# Patient Record
Sex: Male | Born: 1935 | Race: White | Hispanic: No | Marital: Married | Smoking: Former smoker
Health system: Southern US, Community
[De-identification: ages and names within clinical notes are randomized; demographics above are authoritative.]

## PROBLEM LIST (undated history)

## (undated) DIAGNOSIS — K5730 Diverticulosis of large intestine without perforation or abscess without bleeding: Secondary | ICD-10-CM

## (undated) DIAGNOSIS — I359 Nonrheumatic aortic valve disorder, unspecified: Secondary | ICD-10-CM

## (undated) DIAGNOSIS — Z952 Presence of prosthetic heart valve: Secondary | ICD-10-CM

## (undated) DIAGNOSIS — Z9889 Other specified postprocedural states: Secondary | ICD-10-CM

## (undated) DIAGNOSIS — I509 Heart failure, unspecified: Secondary | ICD-10-CM

## (undated) DIAGNOSIS — R000 Tachycardia, unspecified: Secondary | ICD-10-CM

## (undated) DIAGNOSIS — I447 Left bundle-branch block, unspecified: Secondary | ICD-10-CM

## (undated) DIAGNOSIS — N189 Chronic kidney disease, unspecified: Secondary | ICD-10-CM

## (undated) DIAGNOSIS — Z7901 Long term (current) use of anticoagulants: Secondary | ICD-10-CM

## (undated) DIAGNOSIS — I428 Other cardiomyopathies: Secondary | ICD-10-CM

## (undated) DIAGNOSIS — I10 Essential (primary) hypertension: Secondary | ICD-10-CM

## (undated) DIAGNOSIS — E079 Disorder of thyroid, unspecified: Secondary | ICD-10-CM

## (undated) DIAGNOSIS — E785 Hyperlipidemia, unspecified: Secondary | ICD-10-CM

## (undated) DIAGNOSIS — Z8679 Personal history of other diseases of the circulatory system: Secondary | ICD-10-CM

## (undated) DIAGNOSIS — I43 Cardiomyopathy in diseases classified elsewhere: Secondary | ICD-10-CM

## (undated) DIAGNOSIS — IMO0002 Reserved for concepts with insufficient information to code with codable children: Secondary | ICD-10-CM

## (undated) HISTORY — PX: CORONARY ARTERY BYPASS GRAFT: SHX141

## (undated) HISTORY — DX: Personal history of other diseases of the circulatory system: Z98.890

## (undated) HISTORY — DX: Disorder of thyroid, unspecified: E07.9

## (undated) HISTORY — DX: Other cardiomyopathies: I42.8

## (undated) HISTORY — DX: Chronic kidney disease, unspecified: N18.9

## (undated) HISTORY — DX: Nonrheumatic aortic valve disorder, unspecified: I35.9

## (undated) HISTORY — DX: Cardiomyopathy in diseases classified elsewhere: I43

## (undated) HISTORY — DX: Tachycardia, unspecified: R00.0

## (undated) HISTORY — DX: Left bundle-branch block, unspecified: I44.7

## (undated) HISTORY — DX: Reserved for concepts with insufficient information to code with codable children: IMO0002

## (undated) HISTORY — DX: Heart failure, unspecified: I50.9

## (undated) HISTORY — DX: Personal history of other diseases of the circulatory system: Z86.79

## (undated) HISTORY — DX: Presence of prosthetic heart valve: Z95.2

## (undated) HISTORY — DX: Diverticulosis of large intestine without perforation or abscess without bleeding: K57.30

## (undated) HISTORY — DX: Essential (primary) hypertension: I10

## (undated) HISTORY — DX: Hyperlipidemia, unspecified: E78.5

## (undated) HISTORY — DX: Long term (current) use of anticoagulants: Z79.01

---

## 1990-08-25 HISTORY — PX: AORTIC VALVE SURGERY: SHX549

## 1998-01-23 ENCOUNTER — Encounter (HOSPITAL_COMMUNITY): Admission: RE | Admit: 1998-01-23 | Discharge: 1998-04-23 | Payer: Self-pay | Admitting: Cardiology

## 1998-05-04 ENCOUNTER — Encounter (HOSPITAL_COMMUNITY): Admission: RE | Admit: 1998-05-04 | Discharge: 1998-08-02 | Payer: Self-pay | Admitting: Cardiology

## 1999-11-21 ENCOUNTER — Encounter: Payer: Self-pay | Admitting: Internal Medicine

## 1999-11-21 ENCOUNTER — Ambulatory Visit (HOSPITAL_COMMUNITY): Admission: RE | Admit: 1999-11-21 | Discharge: 1999-11-21 | Payer: Self-pay | Admitting: Internal Medicine

## 2000-07-24 ENCOUNTER — Ambulatory Visit (HOSPITAL_COMMUNITY): Admission: RE | Admit: 2000-07-24 | Discharge: 2000-07-24 | Payer: Self-pay | Admitting: Cardiology

## 2000-10-15 ENCOUNTER — Inpatient Hospital Stay (HOSPITAL_COMMUNITY): Admission: AD | Admit: 2000-10-15 | Discharge: 2000-10-17 | Payer: Self-pay | Admitting: Internal Medicine

## 2000-10-16 ENCOUNTER — Encounter: Payer: Self-pay | Admitting: Internal Medicine

## 2000-10-16 ENCOUNTER — Ambulatory Visit: Admission: RE | Admit: 2000-10-16 | Discharge: 2000-10-16 | Payer: Self-pay | Admitting: Internal Medicine

## 2002-03-18 ENCOUNTER — Encounter: Admission: RE | Admit: 2002-03-18 | Discharge: 2002-06-16 | Payer: Self-pay | Admitting: Internal Medicine

## 2002-04-12 ENCOUNTER — Encounter: Payer: Self-pay | Admitting: Emergency Medicine

## 2002-04-12 ENCOUNTER — Encounter: Payer: Self-pay | Admitting: Cardiology

## 2002-04-12 ENCOUNTER — Inpatient Hospital Stay (HOSPITAL_COMMUNITY): Admission: EM | Admit: 2002-04-12 | Discharge: 2002-04-17 | Payer: Self-pay | Admitting: Emergency Medicine

## 2002-04-13 ENCOUNTER — Encounter: Payer: Self-pay | Admitting: Internal Medicine

## 2002-04-15 ENCOUNTER — Encounter: Payer: Self-pay | Admitting: Internal Medicine

## 2004-07-02 ENCOUNTER — Ambulatory Visit: Payer: Self-pay

## 2004-07-24 ENCOUNTER — Ambulatory Visit: Payer: Self-pay | Admitting: Cardiology

## 2004-07-25 ENCOUNTER — Ambulatory Visit: Payer: Self-pay | Admitting: Internal Medicine

## 2004-08-22 ENCOUNTER — Ambulatory Visit: Payer: Self-pay | Admitting: *Deleted

## 2004-08-25 HISTORY — PX: OTHER SURGICAL HISTORY: SHX169

## 2004-09-06 ENCOUNTER — Ambulatory Visit: Payer: Self-pay | Admitting: Cardiovascular Disease

## 2004-09-27 ENCOUNTER — Ambulatory Visit: Payer: Self-pay | Admitting: Cardiovascular Disease

## 2004-10-24 ENCOUNTER — Ambulatory Visit: Payer: Self-pay | Admitting: Internal Medicine

## 2004-10-25 ENCOUNTER — Ambulatory Visit: Payer: Self-pay | Admitting: Cardiology

## 2004-10-31 ENCOUNTER — Ambulatory Visit: Payer: Self-pay | Admitting: Internal Medicine

## 2004-11-22 ENCOUNTER — Ambulatory Visit: Payer: Self-pay | Admitting: Cardiology

## 2004-12-02 ENCOUNTER — Inpatient Hospital Stay (HOSPITAL_COMMUNITY): Admission: EM | Admit: 2004-12-02 | Discharge: 2004-12-11 | Payer: Self-pay | Admitting: Emergency Medicine

## 2004-12-02 ENCOUNTER — Ambulatory Visit: Payer: Self-pay | Admitting: Cardiology

## 2004-12-03 ENCOUNTER — Encounter: Payer: Self-pay | Admitting: Cardiology

## 2004-12-04 HISTORY — PX: CARDIAC CATHETERIZATION: SHX172

## 2004-12-16 ENCOUNTER — Ambulatory Visit: Payer: Self-pay | Admitting: Cardiology

## 2004-12-16 ENCOUNTER — Ambulatory Visit: Payer: Self-pay | Admitting: Internal Medicine

## 2004-12-23 ENCOUNTER — Ambulatory Visit: Payer: Self-pay | Admitting: Cardiology

## 2004-12-25 ENCOUNTER — Ambulatory Visit: Payer: Self-pay | Admitting: Internal Medicine

## 2004-12-30 ENCOUNTER — Ambulatory Visit: Payer: Self-pay | Admitting: *Deleted

## 2005-01-02 ENCOUNTER — Ambulatory Visit: Payer: Self-pay | Admitting: Internal Medicine

## 2005-01-21 ENCOUNTER — Ambulatory Visit: Payer: Self-pay | Admitting: *Deleted

## 2005-01-23 ENCOUNTER — Ambulatory Visit: Payer: Self-pay

## 2005-02-17 ENCOUNTER — Ambulatory Visit: Payer: Self-pay | Admitting: Internal Medicine

## 2005-02-20 ENCOUNTER — Ambulatory Visit: Payer: Self-pay | Admitting: Internal Medicine

## 2005-03-07 ENCOUNTER — Ambulatory Visit: Payer: Self-pay | Admitting: Internal Medicine

## 2005-03-11 ENCOUNTER — Ambulatory Visit: Payer: Self-pay | Admitting: Internal Medicine

## 2005-03-11 ENCOUNTER — Observation Stay (HOSPITAL_COMMUNITY): Admission: RE | Admit: 2005-03-11 | Discharge: 2005-03-12 | Payer: Self-pay | Admitting: Internal Medicine

## 2005-03-19 ENCOUNTER — Ambulatory Visit: Payer: Self-pay | Admitting: Cardiology

## 2005-03-26 ENCOUNTER — Ambulatory Visit: Payer: Self-pay

## 2005-03-26 ENCOUNTER — Ambulatory Visit: Payer: Self-pay | Admitting: Cardiovascular Disease

## 2005-04-02 ENCOUNTER — Ambulatory Visit: Payer: Self-pay | Admitting: Cardiology

## 2005-04-07 ENCOUNTER — Ambulatory Visit: Payer: Self-pay | Admitting: *Deleted

## 2005-04-16 ENCOUNTER — Ambulatory Visit: Payer: Self-pay | Admitting: Cardiology

## 2005-05-07 ENCOUNTER — Ambulatory Visit: Payer: Self-pay | Admitting: Cardiology

## 2005-05-14 ENCOUNTER — Ambulatory Visit: Payer: Self-pay | Admitting: Cardiology

## 2005-05-28 ENCOUNTER — Ambulatory Visit: Payer: Self-pay | Admitting: Cardiology

## 2005-06-13 ENCOUNTER — Ambulatory Visit: Payer: Self-pay | Admitting: Internal Medicine

## 2005-06-25 ENCOUNTER — Ambulatory Visit: Payer: Self-pay | Admitting: *Deleted

## 2005-07-23 ENCOUNTER — Ambulatory Visit: Payer: Self-pay | Admitting: Cardiology

## 2005-07-31 ENCOUNTER — Ambulatory Visit: Payer: Self-pay | Admitting: Cardiology

## 2005-08-07 ENCOUNTER — Ambulatory Visit: Payer: Self-pay | Admitting: *Deleted

## 2005-09-04 ENCOUNTER — Ambulatory Visit: Payer: Self-pay

## 2005-10-02 ENCOUNTER — Ambulatory Visit: Payer: Self-pay | Admitting: *Deleted

## 2005-10-03 ENCOUNTER — Ambulatory Visit: Payer: Self-pay | Admitting: Internal Medicine

## 2005-10-30 ENCOUNTER — Ambulatory Visit: Payer: Self-pay | Admitting: Cardiology

## 2005-11-04 ENCOUNTER — Ambulatory Visit: Payer: Self-pay | Admitting: Internal Medicine

## 2005-11-11 ENCOUNTER — Ambulatory Visit: Payer: Self-pay | Admitting: Internal Medicine

## 2005-11-14 ENCOUNTER — Ambulatory Visit: Payer: Self-pay | Admitting: Internal Medicine

## 2005-11-20 ENCOUNTER — Ambulatory Visit: Payer: Self-pay | Admitting: Internal Medicine

## 2005-11-27 ENCOUNTER — Ambulatory Visit: Payer: Self-pay | Admitting: Cardiology

## 2005-12-03 ENCOUNTER — Ambulatory Visit: Payer: Self-pay | Admitting: Internal Medicine

## 2005-12-08 ENCOUNTER — Ambulatory Visit: Payer: Self-pay | Admitting: Internal Medicine

## 2005-12-15 ENCOUNTER — Ambulatory Visit: Payer: Self-pay | Admitting: Internal Medicine

## 2005-12-16 ENCOUNTER — Ambulatory Visit: Payer: Self-pay | Admitting: Cardiology

## 2005-12-22 ENCOUNTER — Ambulatory Visit (HOSPITAL_COMMUNITY): Admission: RE | Admit: 2005-12-22 | Discharge: 2005-12-22 | Payer: Self-pay | Admitting: Cardiology

## 2005-12-22 ENCOUNTER — Encounter: Payer: Self-pay | Admitting: Cardiology

## 2005-12-22 ENCOUNTER — Ambulatory Visit: Payer: Self-pay | Admitting: Cardiology

## 2005-12-24 ENCOUNTER — Ambulatory Visit: Payer: Self-pay | Admitting: Cardiology

## 2005-12-29 ENCOUNTER — Ambulatory Visit: Payer: Self-pay | Admitting: Cardiology

## 2006-01-07 ENCOUNTER — Ambulatory Visit: Payer: Self-pay | Admitting: Cardiology

## 2006-02-04 ENCOUNTER — Ambulatory Visit: Payer: Self-pay | Admitting: Cardiovascular Disease

## 2006-03-04 ENCOUNTER — Ambulatory Visit: Payer: Self-pay | Admitting: Internal Medicine

## 2006-03-09 ENCOUNTER — Ambulatory Visit: Payer: Self-pay | Admitting: Internal Medicine

## 2006-03-11 ENCOUNTER — Ambulatory Visit: Payer: Self-pay | Admitting: Internal Medicine

## 2006-03-13 ENCOUNTER — Ambulatory Visit: Payer: Self-pay | Admitting: Internal Medicine

## 2006-03-18 ENCOUNTER — Ambulatory Visit: Payer: Self-pay | Admitting: Cardiology

## 2006-04-01 ENCOUNTER — Ambulatory Visit: Payer: Self-pay | Admitting: Cardiology

## 2006-04-02 ENCOUNTER — Ambulatory Visit: Payer: Self-pay | Admitting: Internal Medicine

## 2006-04-09 ENCOUNTER — Ambulatory Visit: Payer: Self-pay | Admitting: Internal Medicine

## 2006-04-20 ENCOUNTER — Ambulatory Visit: Payer: Self-pay | Admitting: Cardiology

## 2006-05-14 ENCOUNTER — Ambulatory Visit: Payer: Self-pay | Admitting: Cardiology

## 2006-05-21 ENCOUNTER — Ambulatory Visit: Payer: Self-pay | Admitting: Internal Medicine

## 2006-06-09 ENCOUNTER — Ambulatory Visit: Payer: Self-pay | Admitting: Internal Medicine

## 2006-06-11 ENCOUNTER — Ambulatory Visit: Payer: Self-pay | Admitting: Internal Medicine

## 2006-06-18 ENCOUNTER — Ambulatory Visit: Payer: Self-pay | Admitting: Internal Medicine

## 2006-06-19 ENCOUNTER — Ambulatory Visit: Admission: RE | Admit: 2006-06-19 | Discharge: 2006-06-19 | Payer: Self-pay | Admitting: Internal Medicine

## 2006-06-19 ENCOUNTER — Ambulatory Visit: Payer: Self-pay

## 2006-06-19 ENCOUNTER — Ambulatory Visit: Payer: Self-pay | Admitting: Internal Medicine

## 2006-06-19 ENCOUNTER — Encounter: Payer: Self-pay | Admitting: Internal Medicine

## 2006-06-25 ENCOUNTER — Ambulatory Visit: Payer: Self-pay | Admitting: Internal Medicine

## 2006-06-26 ENCOUNTER — Ambulatory Visit: Payer: Self-pay | Admitting: Cardiology

## 2006-07-17 ENCOUNTER — Ambulatory Visit: Payer: Self-pay | Admitting: Cardiology

## 2006-07-30 ENCOUNTER — Ambulatory Visit: Payer: Self-pay | Admitting: Internal Medicine

## 2006-08-13 ENCOUNTER — Ambulatory Visit: Payer: Self-pay | Admitting: Internal Medicine

## 2006-08-14 ENCOUNTER — Ambulatory Visit: Payer: Self-pay | Admitting: Cardiovascular Disease

## 2006-08-27 ENCOUNTER — Ambulatory Visit: Payer: Self-pay | Admitting: Internal Medicine

## 2006-08-27 LAB — CONVERTED CEMR LAB: T4, Total: 9.1 ug/dL (ref 5.0–12.5)

## 2006-08-31 ENCOUNTER — Ambulatory Visit: Payer: Self-pay | Admitting: Internal Medicine

## 2006-09-01 ENCOUNTER — Ambulatory Visit: Payer: Self-pay | Admitting: Cardiology

## 2006-09-23 ENCOUNTER — Ambulatory Visit: Payer: Self-pay | Admitting: Internal Medicine

## 2006-10-01 ENCOUNTER — Ambulatory Visit: Payer: Self-pay | Admitting: Cardiology

## 2006-10-15 ENCOUNTER — Ambulatory Visit: Payer: Self-pay | Admitting: Internal Medicine

## 2006-10-15 LAB — CONVERTED CEMR LAB
Free T4: 1 ng/dL (ref 0.6–1.6)
T3 Uptake Ratio: 42.7 % — ABNORMAL HIGH (ref 22.5–37.0)

## 2006-11-05 ENCOUNTER — Ambulatory Visit: Payer: Self-pay | Admitting: Cardiology

## 2006-12-03 ENCOUNTER — Ambulatory Visit: Payer: Self-pay | Admitting: Cardiology

## 2006-12-10 ENCOUNTER — Ambulatory Visit: Payer: Self-pay | Admitting: Internal Medicine

## 2006-12-17 ENCOUNTER — Ambulatory Visit: Payer: Self-pay | Admitting: Internal Medicine

## 2006-12-31 ENCOUNTER — Ambulatory Visit: Payer: Self-pay | Admitting: Cardiology

## 2007-01-07 ENCOUNTER — Ambulatory Visit: Payer: Self-pay | Admitting: Internal Medicine

## 2007-01-26 ENCOUNTER — Ambulatory Visit: Payer: Self-pay | Admitting: Cardiology

## 2007-02-12 ENCOUNTER — Ambulatory Visit: Payer: Self-pay | Admitting: Internal Medicine

## 2007-02-12 DIAGNOSIS — I4891 Unspecified atrial fibrillation: Secondary | ICD-10-CM | POA: Insufficient documentation

## 2007-02-12 DIAGNOSIS — I10 Essential (primary) hypertension: Secondary | ICD-10-CM | POA: Insufficient documentation

## 2007-02-12 DIAGNOSIS — E039 Hypothyroidism, unspecified: Secondary | ICD-10-CM

## 2007-02-12 DIAGNOSIS — E785 Hyperlipidemia, unspecified: Secondary | ICD-10-CM | POA: Insufficient documentation

## 2007-02-12 LAB — CONVERTED CEMR LAB
PSA: 0.84 ng/mL (ref 0.10–4.00)
T4, Total: 10.3 ug/dL (ref 5.0–12.5)
TSH: 2.56 microintl units/mL (ref 0.35–5.50)

## 2007-02-23 ENCOUNTER — Ambulatory Visit: Payer: Self-pay | Admitting: Cardiology

## 2007-03-17 ENCOUNTER — Ambulatory Visit: Payer: Self-pay | Admitting: Gastroenterology

## 2007-03-23 ENCOUNTER — Ambulatory Visit: Payer: Self-pay | Admitting: Cardiology

## 2007-03-31 ENCOUNTER — Ambulatory Visit (HOSPITAL_COMMUNITY): Admission: RE | Admit: 2007-03-31 | Discharge: 2007-03-31 | Payer: Self-pay | Admitting: Gastroenterology

## 2007-03-31 ENCOUNTER — Encounter: Payer: Self-pay | Admitting: Gastroenterology

## 2007-03-31 DIAGNOSIS — K5730 Diverticulosis of large intestine without perforation or abscess without bleeding: Secondary | ICD-10-CM

## 2007-03-31 HISTORY — DX: Diverticulosis of large intestine without perforation or abscess without bleeding: K57.30

## 2007-04-05 ENCOUNTER — Ambulatory Visit: Payer: Self-pay | Admitting: Gastroenterology

## 2007-04-09 ENCOUNTER — Ambulatory Visit: Payer: Self-pay | Admitting: Cardiology

## 2007-04-13 ENCOUNTER — Ambulatory Visit: Payer: Self-pay | Admitting: Internal Medicine

## 2007-04-13 DIAGNOSIS — M1710 Unilateral primary osteoarthritis, unspecified knee: Secondary | ICD-10-CM

## 2007-04-22 ENCOUNTER — Ambulatory Visit: Payer: Self-pay | Admitting: Cardiology

## 2007-05-13 ENCOUNTER — Ambulatory Visit: Payer: Self-pay | Admitting: Internal Medicine

## 2007-05-13 ENCOUNTER — Ambulatory Visit: Payer: Self-pay | Admitting: Cardiology

## 2007-05-13 LAB — CONVERTED CEMR LAB: Prothrombin Time: 30 s — ABNORMAL HIGH (ref 10.9–13.3)

## 2007-06-14 ENCOUNTER — Ambulatory Visit: Payer: Self-pay | Admitting: Internal Medicine

## 2007-07-08 ENCOUNTER — Ambulatory Visit: Payer: Self-pay | Admitting: Internal Medicine

## 2007-07-08 LAB — CONVERTED CEMR LAB
AST: 34 units/L (ref 0–37)
Albumin: 3.6 g/dL (ref 3.5–5.2)
Alkaline Phosphatase: 109 units/L (ref 39–117)
BUN: 18 mg/dL (ref 6–23)
Bilirubin, Direct: 0.1 mg/dL (ref 0.0–0.3)
Cholesterol: 234 mg/dL (ref 0–200)
Direct LDL: 149.7 mg/dL
GFR calc non Af Amer: 49 mL/min
HDL: 21.7 mg/dL — ABNORMAL LOW (ref >39.0)
Total Bilirubin: 0.8 mg/dL (ref 0.3–1.2)
Total CHOL/HDL Ratio: 10.8
Total Protein: 6.8 g/dL (ref 6.0–8.3)
VLDL: 64 mg/dL — ABNORMAL HIGH (ref 0–40)

## 2007-07-12 ENCOUNTER — Ambulatory Visit: Payer: Self-pay | Admitting: Cardiology

## 2007-07-15 ENCOUNTER — Ambulatory Visit: Payer: Self-pay | Admitting: Internal Medicine

## 2007-07-15 DIAGNOSIS — E8881 Metabolic syndrome: Secondary | ICD-10-CM | POA: Insufficient documentation

## 2007-07-15 LAB — CONVERTED CEMR LAB
Cholesterol, target level: 200 mg/dL
HDL goal, serum: 40 mg/dL

## 2007-08-09 ENCOUNTER — Ambulatory Visit: Payer: Self-pay | Admitting: Internal Medicine

## 2007-09-06 ENCOUNTER — Ambulatory Visit: Payer: Self-pay | Admitting: Cardiology

## 2007-09-21 ENCOUNTER — Ambulatory Visit: Payer: Self-pay | Admitting: Internal Medicine

## 2007-09-21 ENCOUNTER — Ambulatory Visit: Payer: Self-pay | Admitting: Cardiovascular Disease

## 2007-10-08 ENCOUNTER — Ambulatory Visit: Payer: Self-pay | Admitting: Internal Medicine

## 2007-10-08 LAB — CONVERTED CEMR LAB
AST: 47 units/L — ABNORMAL HIGH (ref 0–37)
Bilirubin, Direct: 0.2 mg/dL (ref 0.0–0.3)
Total Bilirubin: 1.2 mg/dL (ref 0.3–1.2)
Total Protein: 6.7 g/dL (ref 6.0–8.3)

## 2007-10-15 ENCOUNTER — Ambulatory Visit: Payer: Self-pay | Admitting: Internal Medicine

## 2007-10-19 ENCOUNTER — Ambulatory Visit: Payer: Self-pay | Admitting: Cardiology

## 2007-11-16 ENCOUNTER — Ambulatory Visit: Payer: Self-pay | Admitting: Cardiovascular Disease

## 2007-12-07 ENCOUNTER — Telehealth: Payer: Self-pay | Admitting: Internal Medicine

## 2007-12-07 DIAGNOSIS — N182 Chronic kidney disease, stage 2 (mild): Secondary | ICD-10-CM

## 2007-12-14 ENCOUNTER — Ambulatory Visit: Payer: Self-pay | Admitting: Cardiology

## 2007-12-23 ENCOUNTER — Ambulatory Visit: Payer: Self-pay | Admitting: Internal Medicine

## 2008-01-11 ENCOUNTER — Ambulatory Visit: Payer: Self-pay | Admitting: Cardiology

## 2008-02-04 ENCOUNTER — Ambulatory Visit: Payer: Self-pay | Admitting: Internal Medicine

## 2008-02-04 LAB — CONVERTED CEMR LAB: TSH: 2.28 microintl units/mL (ref 0.35–5.50)

## 2008-02-08 ENCOUNTER — Ambulatory Visit: Payer: Self-pay | Admitting: Cardiology

## 2008-02-17 ENCOUNTER — Ambulatory Visit: Payer: Self-pay | Admitting: Internal Medicine

## 2008-02-29 ENCOUNTER — Ambulatory Visit: Payer: Self-pay | Admitting: Cardiovascular Disease

## 2008-03-28 ENCOUNTER — Ambulatory Visit: Payer: Self-pay | Admitting: Cardiology

## 2008-04-25 ENCOUNTER — Ambulatory Visit: Payer: Self-pay | Admitting: Cardiology

## 2008-05-15 ENCOUNTER — Ambulatory Visit: Payer: Self-pay | Admitting: Cardiovascular Disease

## 2008-06-08 ENCOUNTER — Ambulatory Visit: Payer: Self-pay | Admitting: Internal Medicine

## 2008-06-08 LAB — CONVERTED CEMR LAB
ALT: 73 units/L — ABNORMAL HIGH (ref 0–53)
AST: 68 units/L — ABNORMAL HIGH (ref 0–37)
Albumin: 3.7 g/dL (ref 3.5–5.2)
Alkaline Phosphatase: 80 units/L (ref 39–117)
BUN: 18 mg/dL (ref 6–23)
Bilirubin, Direct: 0.1 mg/dL (ref 0.0–0.3)
CO2: 33 meq/L — ABNORMAL HIGH (ref 19–32)
Cholesterol: 164 mg/dL (ref 0–200)
Creatinine, Ser: 1.3 mg/dL (ref 0.4–1.5)
GFR calc non Af Amer: 57 mL/min
Glucose, Bld: 119 mg/dL — ABNORMAL HIGH (ref 70–99)
HDL: 28.9 mg/dL — ABNORMAL LOW (ref >39.0)
LDL Cholesterol: 99 mg/dL (ref 0–99)
Total Protein: 7.5 g/dL (ref 6.0–8.3)
Triglycerides: 183 mg/dL — ABNORMAL HIGH (ref 0–149)

## 2008-06-14 ENCOUNTER — Ambulatory Visit: Payer: Self-pay | Admitting: Internal Medicine

## 2008-06-22 ENCOUNTER — Ambulatory Visit: Payer: Self-pay | Admitting: Internal Medicine

## 2008-07-10 ENCOUNTER — Telehealth: Payer: Self-pay | Admitting: Internal Medicine

## 2008-07-13 ENCOUNTER — Telehealth: Payer: Self-pay | Admitting: Internal Medicine

## 2008-07-18 ENCOUNTER — Ambulatory Visit: Payer: Self-pay | Admitting: Internal Medicine

## 2008-08-07 ENCOUNTER — Ambulatory Visit: Payer: Self-pay | Admitting: Internal Medicine

## 2008-08-07 LAB — CONVERTED CEMR LAB
ALT: 51 units/L (ref 0–53)
Albumin: 3.7 g/dL (ref 3.5–5.2)
Triglycerides: 224 mg/dL (ref 0–149)

## 2008-08-14 ENCOUNTER — Ambulatory Visit: Payer: Self-pay | Admitting: Internal Medicine

## 2008-08-24 ENCOUNTER — Ambulatory Visit: Payer: Self-pay | Admitting: Internal Medicine

## 2008-09-05 ENCOUNTER — Ambulatory Visit: Payer: Self-pay | Admitting: Internal Medicine

## 2008-09-18 ENCOUNTER — Telehealth: Payer: Self-pay | Admitting: Internal Medicine

## 2008-09-18 ENCOUNTER — Ambulatory Visit: Payer: Self-pay | Admitting: Internal Medicine

## 2008-09-18 DIAGNOSIS — R059 Cough, unspecified: Secondary | ICD-10-CM | POA: Insufficient documentation

## 2008-09-18 DIAGNOSIS — R05 Cough: Secondary | ICD-10-CM | POA: Insufficient documentation

## 2008-09-22 ENCOUNTER — Ambulatory Visit: Payer: Self-pay | Admitting: Internal Medicine

## 2008-09-22 LAB — CONVERTED CEMR LAB
INR: 6.7
Prothrombin Time: 31.3 s

## 2008-10-05 ENCOUNTER — Encounter: Payer: Self-pay | Admitting: Internal Medicine

## 2008-10-09 ENCOUNTER — Ambulatory Visit: Payer: Self-pay | Admitting: Internal Medicine

## 2008-10-09 LAB — CONVERTED CEMR LAB
ALT: 33 units/L (ref 0–53)
Bilirubin, Direct: 0.2 mg/dL (ref 0.0–0.3)
Direct LDL: 96.8 mg/dL
HDL: 24.5 mg/dL — ABNORMAL LOW (ref >39.0)
Total CHOL/HDL Ratio: 6.6
Triglycerides: 216 mg/dL (ref 0–149)
VLDL: 43 mg/dL — ABNORMAL HIGH (ref 0–40)

## 2008-10-16 ENCOUNTER — Ambulatory Visit: Payer: Self-pay | Admitting: Internal Medicine

## 2008-10-19 ENCOUNTER — Ambulatory Visit: Payer: Self-pay | Admitting: Internal Medicine

## 2008-11-06 ENCOUNTER — Ambulatory Visit: Payer: Self-pay | Admitting: Internal Medicine

## 2008-11-06 LAB — CONVERTED CEMR LAB: INR: 2.3

## 2008-12-06 ENCOUNTER — Emergency Department (HOSPITAL_COMMUNITY): Admission: EM | Admit: 2008-12-06 | Discharge: 2008-12-06 | Payer: Self-pay | Admitting: Family Medicine

## 2008-12-07 ENCOUNTER — Ambulatory Visit: Payer: Self-pay | Admitting: Internal Medicine

## 2008-12-11 ENCOUNTER — Ambulatory Visit: Payer: Self-pay | Admitting: Internal Medicine

## 2008-12-11 DIAGNOSIS — IMO0002 Reserved for concepts with insufficient information to code with codable children: Secondary | ICD-10-CM

## 2008-12-11 HISTORY — DX: Reserved for concepts with insufficient information to code with codable children: IMO0002

## 2008-12-11 LAB — CONVERTED CEMR LAB: Prothrombin Time: 19.7 s

## 2009-01-02 ENCOUNTER — Encounter: Payer: Self-pay | Admitting: Internal Medicine

## 2009-01-08 ENCOUNTER — Ambulatory Visit: Payer: Self-pay | Admitting: Internal Medicine

## 2009-02-05 ENCOUNTER — Ambulatory Visit: Payer: Self-pay | Admitting: Internal Medicine

## 2009-02-05 LAB — CONVERTED CEMR LAB: Prothrombin Time: 19 s

## 2009-02-28 ENCOUNTER — Ambulatory Visit: Payer: Self-pay | Admitting: Internal Medicine

## 2009-02-28 LAB — CONVERTED CEMR LAB
ALT: 78 units/L — ABNORMAL HIGH (ref 0–53)
AST: 118 units/L — ABNORMAL HIGH (ref 0–37)
Alkaline Phosphatase: 63 units/L (ref 39–117)
CO2: 29 meq/L (ref 19–32)
Calcium: 9.1 mg/dL (ref 8.4–10.5)
Cholesterol: 134 mg/dL (ref 0–200)
Creatinine, Ser: 1.5 mg/dL (ref 0.4–1.5)
Glucose, Bld: 129 mg/dL — ABNORMAL HIGH (ref 70–99)
LDL Cholesterol: 84 mg/dL (ref 0–99)
Total CHOL/HDL Ratio: 5
Total Protein: 7.2 g/dL (ref 6.0–8.3)
VLDL: 23.8 mg/dL (ref 0.0–40.0)

## 2009-03-07 ENCOUNTER — Ambulatory Visit: Payer: Self-pay | Admitting: Internal Medicine

## 2009-03-08 ENCOUNTER — Ambulatory Visit: Payer: Self-pay | Admitting: Internal Medicine

## 2009-04-11 ENCOUNTER — Ambulatory Visit: Payer: Self-pay | Admitting: Internal Medicine

## 2009-04-11 LAB — CONVERTED CEMR LAB
INR: 2.3
Prothrombin Time: 18.4 s

## 2009-05-09 ENCOUNTER — Ambulatory Visit: Payer: Self-pay | Admitting: Internal Medicine

## 2009-05-09 LAB — CONVERTED CEMR LAB
Albumin: 3.2 g/dL — ABNORMAL LOW (ref 3.5–5.2)
Alkaline Phosphatase: 66 units/L (ref 39–117)
Total Bilirubin: 1 mg/dL (ref 0.3–1.2)

## 2009-05-16 ENCOUNTER — Ambulatory Visit: Payer: Self-pay | Admitting: Internal Medicine

## 2009-05-16 LAB — CONVERTED CEMR LAB: INR: 2.5

## 2009-06-07 ENCOUNTER — Ambulatory Visit: Payer: Self-pay | Admitting: Internal Medicine

## 2009-06-13 ENCOUNTER — Encounter: Payer: Self-pay | Admitting: Internal Medicine

## 2009-06-14 ENCOUNTER — Ambulatory Visit: Payer: Self-pay | Admitting: Internal Medicine

## 2009-06-14 LAB — CONVERTED CEMR LAB: INR: 1.8

## 2009-07-12 ENCOUNTER — Ambulatory Visit: Payer: Self-pay | Admitting: Internal Medicine

## 2009-08-10 ENCOUNTER — Ambulatory Visit: Payer: Self-pay | Admitting: Internal Medicine

## 2009-08-10 DIAGNOSIS — G8929 Other chronic pain: Secondary | ICD-10-CM

## 2009-08-10 DIAGNOSIS — M549 Dorsalgia, unspecified: Secondary | ICD-10-CM

## 2009-08-10 LAB — CONVERTED CEMR LAB
BUN: 20 mg/dL (ref 6–23)
CO2: 31 meq/L (ref 19–32)
Cholesterol: 158 mg/dL (ref 0–200)
Direct LDL: 101 mg/dL — ABNORMAL HIGH
Potassium: 4.5 meq/L (ref 3.5–5.3)
TSH: 2.867 microintl units/mL (ref 0.350–4.500)

## 2009-09-07 ENCOUNTER — Ambulatory Visit: Payer: Self-pay | Admitting: Internal Medicine

## 2009-09-07 LAB — CONVERTED CEMR LAB: Prothrombin Time: 18.6 s

## 2009-09-25 ENCOUNTER — Ambulatory Visit: Payer: Self-pay | Admitting: Internal Medicine

## 2009-09-25 DIAGNOSIS — I5022 Chronic systolic (congestive) heart failure: Secondary | ICD-10-CM

## 2009-09-25 DIAGNOSIS — Z95810 Presence of automatic (implantable) cardiac defibrillator: Secondary | ICD-10-CM

## 2009-10-05 ENCOUNTER — Ambulatory Visit: Payer: Self-pay | Admitting: Internal Medicine

## 2009-10-05 LAB — CONVERTED CEMR LAB
INR: 2
Prothrombin Time: 17.3 s

## 2009-10-31 ENCOUNTER — Ambulatory Visit: Payer: Self-pay | Admitting: Internal Medicine

## 2009-10-31 LAB — CONVERTED CEMR LAB
Chloride: 109 meq/L (ref 96–112)
Direct LDL: 96.8 mg/dL
Free T4: 1.8 ng/dL — ABNORMAL HIGH (ref 0.6–1.6)
GFR calc non Af Amer: 48.42 mL/min (ref >60)
Prothrombin Time: 15.9 s

## 2009-11-01 ENCOUNTER — Encounter: Payer: Self-pay | Admitting: Internal Medicine

## 2009-12-03 ENCOUNTER — Ambulatory Visit: Payer: Self-pay | Admitting: Internal Medicine

## 2009-12-27 ENCOUNTER — Encounter: Payer: Self-pay | Admitting: Internal Medicine

## 2009-12-31 ENCOUNTER — Ambulatory Visit: Payer: Self-pay | Admitting: Internal Medicine

## 2009-12-31 LAB — CONVERTED CEMR LAB
INR: 2
Prothrombin Time: 17.5 s

## 2010-01-01 ENCOUNTER — Ambulatory Visit: Payer: Self-pay | Admitting: Internal Medicine

## 2010-01-14 ENCOUNTER — Ambulatory Visit: Payer: Self-pay | Admitting: Internal Medicine

## 2010-01-15 LAB — CONVERTED CEMR LAB
Basophils Absolute: 0.1 10*3/uL (ref 0.0–0.1)
CO2: 29 meq/L (ref 19–32)
Creatinine, Ser: 1.4 mg/dL (ref 0.4–1.5)
Eosinophils Absolute: 0.2 10*3/uL (ref 0.0–0.7)
Hemoglobin: 11.6 g/dL — ABNORMAL LOW (ref 13.0–17.0)
Lymphocytes Relative: 11.6 % — ABNORMAL LOW (ref 12.0–46.0)
Lymphs Abs: 0.8 10*3/uL (ref 0.7–4.0)
MCV: 78.1 fL (ref 78.0–100.0)
RBC: 4.57 M/uL (ref 4.22–5.81)
RDW: 17.5 % — ABNORMAL HIGH (ref 11.5–14.6)
WBC: 7.4 10*3/uL (ref 4.5–10.5)
aPTT: 32.9 s — ABNORMAL HIGH (ref 21.7–28.8)

## 2010-01-18 ENCOUNTER — Ambulatory Visit (HOSPITAL_COMMUNITY): Admission: RE | Admit: 2010-01-18 | Discharge: 2010-01-18 | Payer: Self-pay | Admitting: Internal Medicine

## 2010-01-18 ENCOUNTER — Ambulatory Visit: Payer: Self-pay | Admitting: Internal Medicine

## 2010-01-28 ENCOUNTER — Ambulatory Visit: Payer: Self-pay | Admitting: Internal Medicine

## 2010-01-31 ENCOUNTER — Encounter: Payer: Self-pay | Admitting: Internal Medicine

## 2010-02-04 ENCOUNTER — Encounter: Payer: Self-pay | Admitting: Physician Assistant

## 2010-02-04 ENCOUNTER — Ambulatory Visit: Payer: Self-pay | Admitting: Cardiology

## 2010-02-13 ENCOUNTER — Ambulatory Visit: Payer: Self-pay | Admitting: Internal Medicine

## 2010-02-15 ENCOUNTER — Telehealth: Payer: Self-pay | Admitting: Internal Medicine

## 2010-02-27 ENCOUNTER — Ambulatory Visit: Payer: Self-pay | Admitting: Internal Medicine

## 2010-02-27 LAB — CONVERTED CEMR LAB: INR: 2.2

## 2010-03-06 ENCOUNTER — Ambulatory Visit: Payer: Self-pay | Admitting: Cardiology

## 2010-03-06 ENCOUNTER — Ambulatory Visit: Payer: Self-pay

## 2010-03-06 ENCOUNTER — Ambulatory Visit (HOSPITAL_COMMUNITY): Admission: RE | Admit: 2010-03-06 | Discharge: 2010-03-06 | Payer: Self-pay | Admitting: Internal Medicine

## 2010-03-06 ENCOUNTER — Encounter: Payer: Self-pay | Admitting: Internal Medicine

## 2010-03-26 ENCOUNTER — Ambulatory Visit: Payer: Self-pay | Admitting: Internal Medicine

## 2010-04-17 ENCOUNTER — Encounter: Payer: Self-pay | Admitting: Internal Medicine

## 2010-04-18 ENCOUNTER — Ambulatory Visit: Payer: Self-pay | Admitting: Internal Medicine

## 2010-04-19 ENCOUNTER — Encounter: Payer: Self-pay | Admitting: Internal Medicine

## 2010-04-19 LAB — CONVERTED CEMR LAB
Basophils Relative: 0.3 % (ref 0.0–3.0)
CO2: 28 meq/L (ref 19–32)
Calcium: 9.2 mg/dL (ref 8.4–10.5)
Chloride: 106 meq/L (ref 96–112)
Eosinophils Absolute: 0.3 10*3/uL (ref 0.0–0.7)
Glucose, Bld: 153 mg/dL — ABNORMAL HIGH (ref 70–99)
HCT: 38.4 % — ABNORMAL LOW (ref 39.0–52.0)
Hemoglobin: 12.5 g/dL — ABNORMAL LOW (ref 13.0–17.0)
INR: 2.1 — ABNORMAL HIGH (ref 0.8–1.0)
Lymphocytes Relative: 12.6 % (ref 12.0–46.0)
Lymphs Abs: 0.9 10*3/uL (ref 0.7–4.0)
MCHC: 32.6 g/dL (ref 30.0–36.0)
Neutro Abs: 5.1 10*3/uL (ref 1.4–7.7)
Potassium: 4.5 meq/L (ref 3.5–5.1)
RBC: 5.07 M/uL (ref 4.22–5.81)
RDW: 20.1 % — ABNORMAL HIGH (ref 11.5–14.6)
Sodium: 139 meq/L (ref 135–145)

## 2010-04-26 ENCOUNTER — Ambulatory Visit (HOSPITAL_COMMUNITY): Admission: RE | Admit: 2010-04-26 | Discharge: 2010-04-27 | Payer: Self-pay | Admitting: Internal Medicine

## 2010-04-26 ENCOUNTER — Ambulatory Visit: Payer: Self-pay | Admitting: Internal Medicine

## 2010-05-02 ENCOUNTER — Ambulatory Visit: Payer: Self-pay | Admitting: Internal Medicine

## 2010-05-02 LAB — CONVERTED CEMR LAB: INR: 1.3

## 2010-05-09 ENCOUNTER — Ambulatory Visit: Payer: Self-pay | Admitting: Internal Medicine

## 2010-05-14 ENCOUNTER — Encounter: Payer: Self-pay | Admitting: Physician Assistant

## 2010-05-15 ENCOUNTER — Encounter: Payer: Self-pay | Admitting: Physician Assistant

## 2010-05-15 ENCOUNTER — Ambulatory Visit: Payer: Self-pay | Admitting: Cardiology

## 2010-05-15 ENCOUNTER — Encounter: Payer: Self-pay | Admitting: Internal Medicine

## 2010-05-29 ENCOUNTER — Ambulatory Visit: Payer: Self-pay | Admitting: Internal Medicine

## 2010-05-29 LAB — CONVERTED CEMR LAB
Creatinine, Ser: 1.4 mg/dL (ref 0.4–1.5)
Hgb A1c MFr Bld: 7 % — ABNORMAL HIGH (ref 4.6–6.5)
INR: 2.3
Potassium: 3.9 meq/L (ref 3.5–5.1)
Sodium: 141 meq/L (ref 135–145)

## 2010-07-01 ENCOUNTER — Ambulatory Visit: Payer: Self-pay | Admitting: Internal Medicine

## 2010-07-08 ENCOUNTER — Ambulatory Visit: Payer: Self-pay | Admitting: Internal Medicine

## 2010-07-09 ENCOUNTER — Encounter: Payer: Self-pay | Admitting: Internal Medicine

## 2010-07-09 LAB — CONVERTED CEMR LAB
BUN: 20 mg/dL (ref 6–23)
Basophils Absolute: 0.3 10*3/uL — ABNORMAL HIGH (ref 0.0–0.1)
CO2: 30 meq/L (ref 19–32)
Chloride: 102 meq/L (ref 96–112)
Glucose, Bld: 124 mg/dL — ABNORMAL HIGH (ref 70–99)
HCT: 36.8 % — ABNORMAL LOW (ref 39.0–52.0)
Hemoglobin: 12 g/dL — ABNORMAL LOW (ref 13.0–17.0)
Lymphs Abs: 1.1 10*3/uL (ref 0.7–4.0)
MCHC: 32.6 g/dL (ref 30.0–36.0)
MCV: 77.4 fL — ABNORMAL LOW (ref 78.0–100.0)
Monocytes Absolute: 0.7 10*3/uL (ref 0.1–1.0)
Neutro Abs: 4.7 10*3/uL (ref 1.4–7.7)
Platelets: 170 10*3/uL (ref 150.0–400.0)
Potassium: 4.2 meq/L (ref 3.5–5.1)
Prothrombin Time: 23.1 s — ABNORMAL HIGH (ref 9.7–11.8)
RDW: 19.2 % — ABNORMAL HIGH (ref 11.5–14.6)

## 2010-07-12 ENCOUNTER — Ambulatory Visit (HOSPITAL_COMMUNITY)
Admission: RE | Admit: 2010-07-12 | Discharge: 2010-07-13 | Payer: Self-pay | Source: Home / Self Care | Admitting: Internal Medicine

## 2010-07-12 ENCOUNTER — Ambulatory Visit: Payer: Self-pay | Admitting: Internal Medicine

## 2010-07-25 ENCOUNTER — Encounter: Payer: Self-pay | Admitting: Internal Medicine

## 2010-07-25 ENCOUNTER — Ambulatory Visit: Payer: Self-pay

## 2010-07-29 ENCOUNTER — Ambulatory Visit: Payer: Self-pay | Admitting: Internal Medicine

## 2010-08-01 ENCOUNTER — Ambulatory Visit: Payer: Self-pay | Admitting: Internal Medicine

## 2010-08-01 LAB — CONVERTED CEMR LAB: INR: 2.6

## 2010-08-28 ENCOUNTER — Ambulatory Visit
Admission: RE | Admit: 2010-08-28 | Discharge: 2010-08-28 | Payer: Self-pay | Source: Home / Self Care | Attending: Internal Medicine | Admitting: Internal Medicine

## 2010-08-28 LAB — CONVERTED CEMR LAB: INR: 2.7

## 2010-09-20 ENCOUNTER — Ambulatory Visit
Admission: RE | Admit: 2010-09-20 | Discharge: 2010-09-20 | Payer: Self-pay | Source: Home / Self Care | Attending: Internal Medicine | Admitting: Internal Medicine

## 2010-09-20 ENCOUNTER — Other Ambulatory Visit: Payer: Self-pay | Admitting: Internal Medicine

## 2010-09-20 LAB — BASIC METABOLIC PANEL
CO2: 30 mEq/L (ref 19–32)
Calcium: 9 mg/dL (ref 8.4–10.5)
Glucose, Bld: 102 mg/dL — ABNORMAL HIGH (ref 70–99)
Potassium: 3.9 mEq/L (ref 3.5–5.1)

## 2010-09-20 LAB — TSH: TSH: 0.12 u[IU]/mL — ABNORMAL LOW (ref 0.35–5.50)

## 2010-09-20 LAB — T4, FREE: Free T4: 1.54 ng/dL (ref 0.60–1.60)

## 2010-09-20 LAB — T3, FREE: T3, Free: 3.3 pg/mL (ref 2.3–4.2)

## 2010-09-25 ENCOUNTER — Ambulatory Visit (INDEPENDENT_AMBULATORY_CARE_PROVIDER_SITE_OTHER): Payer: MEDICARE | Admitting: Internal Medicine

## 2010-09-25 DIAGNOSIS — Z7901 Long term (current) use of anticoagulants: Secondary | ICD-10-CM

## 2010-09-26 NOTE — Assessment & Plan Note (Signed)
Summary: PT/RCD   Nurse Visit   Allergies: 1)  Amoxicillin (Amoxicillin) 2)  Vytorin (Ezetimibe-Simvastatin) 3)  Bactrim Ds (Sulfamethoxazole-Trimethoprim) Laboratory Results   Blood Tests      INR: 2.3   (Normal Range: 0.88-1.12   Therap INR: 2.0-3.5) Comments: Rita Ohara  January 28, 2010 1:27 PM     Orders Added: 1)  Est. Patient Level I [99211] 2)  Protime [54098JX]   ANTICOAGULATION RECORD PREVIOUS REGIMEN & LAB RESULTS Anticoagulation Diagnosis:  v58.83,v58.61,427.31 on  07/18/2008 Previous INR Goal Range:  2.0-3.0 on  07/18/2008 Previous INR:  2.5 ratio on  01/14/2010 Previous Coumadin Dose(mg):  2mg  QD on  02/05/2009 Previous Regimen:  Same dose on  12/31/2009 Previous Coagulation Comments:  Hold for 7 days then 2mg  qd on  09/22/2008  NEW REGIMEN & LAB RESULTS Current INR: 2.3 Regimen: same  Repeat testing in: 4 weeks  Anticoagulation Visit Questionnaire Coumadin dose missed/changed:  No Abnormal Bleeding Symptoms:  No  Any diet changes including alcohol intake, vegetables or greens since the last visit:  No Any illnesses or hospitalizations since the last visit:  No Any signs of clotting since the last visit (including chest discomfort, dizziness, shortness of breath, arm tingling, slurred speech, swelling or redness in leg):  No  MEDICATIONS AMIODARONE HCL 200 MG TABS (AMIODARONE HCL) take twodaily ASPIR-81 81 MG TBEC (ASPIRIN) Take 1 tablet by mouth once a day CALCIUM 600-D 600-400 MG-UNIT TABS (CALCIUM CARBONATE-VITAMIN D) two times a day COUMADIN 2 MG TABS (WARFARIN SODIUM) 2mg  once daily LASIX 40 MG TABS (FUROSEMIDE) 1 tablet by mouth once a day POTASSIUM CHLORIDE CRYS CR 20 MEQ TBCR (POTASSIUM CHLORIDE CRYS CR) Take 1 tablet once a day LEVOTHYROXINE SODIUM 100 MCG TABS (LEVOTHYROXINE SODIUM) one by mouth daily CRESTOR 10 MG TABS (ROSUVASTATIN CALCIUM) one by mouth daily RAMIPRIL 10 MG CAPS (RAMIPRIL) 1 once daily

## 2010-09-26 NOTE — Assessment & Plan Note (Signed)
Summary: 2 MONTH F/U//ALP   Vital Signs:  Patient profile:   77 year old male Height:      65 inches Weight:      176 pounds BMI:     29.39 Temp:     98.3 degrees F oral Pulse rate:   72 / minute Resp:     14 per minute BP sitting:   134 / 80  (left arm)  Vitals Entered By: Willy Eddy, LPN (September 20, 2010 2:58 PM) CC: roa- had ablation before  Christmas, Hypertension Management, Lipid Management Is Patient Diabetic? No   Primary Care Provider:  Stacie Glaze MD  CC:  roa- had ablation before  Christmas, Hypertension Management, and Lipid Management.  History of Present Illness:  the patient is a 77 year old white male who presents for followup of ablation therapy for chronic atrial fibrillation who had ablation of her and the month of December. the patient's ablation initially he felt well and with increased energy but recently he has seen a decline in his energy.  he notes that the heart rate on his pacer defibrillator was recently changed in the fatigue began after the lowering of the heart rate he is concerned that this is the cause for his fatigue and will make appointment with pacer clinic to see if that can be adjusted upward  to see if that can restore his energy.   the patient complains of acute on chronic knee pain in his right knee difficulty with ambulation a history of chronic degenerative joint disease in the knee that responded a year ago to an injection he is requesting a repeat injection in that right knee  Hypertension History:      He denies headache, chest pain, palpitations, dyspnea with exertion, orthopnea, PND, peripheral edema, visual symptoms, neurologic problems, syncope, and side effects from treatment.   blood pressure is well-controlled without any side effects of his blood pressure medications.        Positive major cardiovascular risk factors include male age 72 years old or older, diabetes, hyperlipidemia, and hypertension.  Negative major  cardiovascular risk factors include non-tobacco-user status.        Positive history for target organ damage include cardiac end organ damage (either CHF or LVH) and peripheral vascular disease.  Further assessment for target organ damage reveals no history of ASHD or stroke/TIA.    Lipid Management History:      Positive NCEP/ATP III risk factors include male age 51 years old or older, diabetes, HDL cholesterol less than 40, hypertension, and peripheral vascular disease.  Negative NCEP/ATP III risk factors include non-tobacco-user status, no ASHD (atherosclerotic heart disease), no prior stroke/TIA, and no history of aortic aneurysm.      Preventive Screening-Counseling & Management  Alcohol-Tobacco     Smoking Status: quit     Year Started: smoked for 28 years     Tobacco Counseling: not indicated; no tobacco use  Problems Prior to Update: 1)  ? of COPD  (ICD-496) 2)  Chronic Systolic Heart Failure  (ICD-428.22) 3)  Automatic Implantable Cardiac Defibrillator Situ  (ICD-V45.02) 4)  Back Pain, Chronic, Intermittent  (ICD-724.5) 5)  Cellulitis and Abscess of Upper Arm and Forearm  (ICD-682.3) 6)  Cough  (ICD-786.2) 7)  Encounter For Therapeutic Drug Monitoring  (ICD-V58.83) 8)  Renal Insufficiency, Chronic  (ICD-585.9) 9)  Diverticulosis of Colon  (ICD-562.10) 10)  Dysmetabolic Syndrome  (ICD-277.7) 11)  Family History of Cad Male 1st Degree Relative <50  (ICD-V17.3) 12)  Osteoarthrosis, Local Nos, Lower Leg  (ICD-715.36) 13)  Hyperlipidemia  (ICD-272.4) 14)  Hypothyroidism  (ICD-244.9) 15)  Hypertension  (ICD-401.9) 16)  Anticoagulation Therapy  (ICD-V58.61) 17)  Atrial Fibrillation  (ICD-427.31)  Current Problems (verified): 1)  ? of COPD  (ICD-496) 2)  Chronic Systolic Heart Failure  (ICD-428.22) 3)  Automatic Implantable Cardiac Defibrillator Situ  (ICD-V45.02) 4)  Back Pain, Chronic, Intermittent  (ICD-724.5) 5)  Cellulitis and Abscess of Upper Arm and Forearm   (ICD-682.3) 6)  Cough  (ICD-786.2) 7)  Encounter For Therapeutic Drug Monitoring  (ICD-V58.83) 8)  Renal Insufficiency, Chronic  (ICD-585.9) 9)  Diverticulosis of Colon  (ICD-562.10) 10)  Dysmetabolic Syndrome  (ICD-277.7) 11)  Family History of Cad Male 1st Degree Relative <50  (ICD-V17.3) 12)  Osteoarthrosis, Local Nos, Lower Leg  (ICD-715.36) 13)  Hyperlipidemia  (ICD-272.4) 14)  Hypothyroidism  (ICD-244.9) 15)  Hypertension  (ICD-401.9) 16)  Anticoagulation Therapy  (ICD-V58.61) 17)  Atrial Fibrillation  (ICD-427.31)  Medications Prior to Update: 1)  Aspir-81 81 Mg Tbec (Aspirin) .... Take 1 Tablet By Mouth Once A Day 2)  Calcium 600-D 600-400 Mg-Unit Tabs (Calcium Carbonate-Vitamin D) .... Two Times A Day 3)  Coumadin 2 Mg Tabs (Warfarin Sodium) .... 2mg  Once Daily 4)  Lasix 40 Mg Tabs (Furosemide) .Marland Kitchen.. 1 Tablet By Mouth Once A Day 5)  Potassium Chloride Crys Cr 20 Meq Tbcr (Potassium Chloride Crys Cr) .... Take 1 Tablet Once A Day 6)  Levothyroxine Sodium 100 Mcg Tabs (Levothyroxine Sodium) .... One By Mouth Daily 7)  Crestor 10 Mg Tabs (Rosuvastatin Calcium) .... One By Mouth Daily 8)  Ramipril 10 Mg Caps (Ramipril) .Marland Kitchen.. 1 Once Daily 9)  Metoprolol Succinate 50 Mg Xr24h-Tab (Metoprolol Succinate) .... 3 Tabs By Mouth At Bedtime  Current Medications (verified): 1)  Aspir-81 81 Mg Tbec (Aspirin) .... Take 1 Tablet By Mouth Once A Day 2)  Calcium 600-D 600-400 Mg-Unit Tabs (Calcium Carbonate-Vitamin D) .... Two Times A Day 3)  Coumadin 2 Mg Tabs (Warfarin Sodium) .... 2mg  Once Daily 4)  Lasix 40 Mg Tabs (Furosemide) .Marland Kitchen.. 1 Tablet By Mouth Once A Day 5)  Potassium Chloride Crys Cr 20 Meq Tbcr (Potassium Chloride Crys Cr) .... Take 1 Tablet Once A Day 6)  Levothyroxine Sodium 100 Mcg Tabs (Levothyroxine Sodium) .... One By Mouth Daily 7)  Crestor 10 Mg Tabs (Rosuvastatin Calcium) .... One By Mouth Daily 8)  Ramipril 10 Mg Caps (Ramipril) .Marland Kitchen.. 1 Once Daily 9)  Metoprolol Succinate  50 Mg Xr24h-Tab (Metoprolol Succinate) .... 3 Tabs By Mouth At Bedtime  Allergies (verified): 1)  ! Penicillin 2)  Amoxicillin (Amoxicillin) 3)  Bactrim Ds (Sulfamethoxazole-Trimethoprim) 4)  Vytorin (Ezetimibe-Simvastatin)  Past History:  Family History: Last updated: 04/13/2007 father Family History of CAD Male 1st degree relative <50 Family History Lung cancer  Social History: Last updated: 04/13/2007 Retired Married Former Smoker quit for 28 years  Risk Factors: Smoking Status: quit (09/20/2010)  Past medical, surgical, family and social histories (including risk factors) reviewed, and no changes noted (except as noted below).  Past Medical History: Reviewed history from 02/12/2007 and no changes required. nonischemic cardiomypathy tachycardia-induced cardiomypathy left bundle branch block Congestive heart failure Atrial fibrillation/s/p ablation Anticoagulation therapy Hypertension Hypothyroidism AVR defibrillator Hyperlipidemia  Past Surgical History: Reviewed history from 04/13/2007 and no changes required. Aortic Valve Replacement:  Coronary artery bypass graft pacer/efibrillator 2006  Family History: Reviewed history from 04/13/2007 and no changes required. father Family History of CAD Male 1st degree relative <50  Family History Lung cancer  Social History: Reviewed history from 04/13/2007 and no changes required. Retired Married Former Smoker quit for 28 years  Review of Systems  The patient denies anorexia, fever, weight loss, weight gain, vision loss, decreased hearing, hoarseness, chest pain, syncope, dyspnea on exertion, peripheral edema, prolonged cough, headaches, hemoptysis, abdominal pain, melena, hematochezia, severe indigestion/heartburn, hematuria, incontinence, genital sores, muscle weakness, suspicious skin lesions, transient blindness, difficulty walking, depression, unusual weight change, abnormal bleeding, enlarged lymph nodes,  angioedema, and breast masses.    Physical Exam  General:  alert, well-developed, and dusky.   Head:  no abnormalities observed and no abnormalities palpated.   Eyes:  pupils equal and pupils round.   Ears:  R ear normal and L ear normal.   Nose:  no external deformity and no nasal discharge.   Lungs:  normal respiratory effort.   slight end exp wheezing no rales Heart:  irregular rhythm.   Abdomen:  soft and non-tender.   Msk:  no joint swelling and no joint warmth.   Extremities:  trace left pedal edema and trace right pedal edema.   Neurologic:  alert & oriented X3 and finger-to-nose normal.     Knee Exam  General:    average weight.    Gait:    limp noted-right.    Inspection:    deformity:   Palpation:    tenderness R-medial joint line.     Impression & Recommendations:  Problem # 1:  AUTOMATIC IMPLANTABLE CARDIAC DEFIBRILLATOR SITU (ICD-V45.02) paced rythmn.... feels mild fatigue  Problem # 2:  RENAL INSUFFICIENCY, CHRONIC (ICD-585.9)  moniter creatinine Labs Reviewed: BUN: 20 (07/08/2010)   Cr: 1.4 (07/08/2010)    Hgb: 12.0 (07/08/2010)   Hct: 36.8 (07/08/2010)   Ca++: 8.7 (07/08/2010)    TP: 6.9 (05/09/2009)   Alb: 3.2 (05/09/2009)  Orders: Venipuncture (16109)  Problem # 3:  HYPOTHYROIDISM (ICD-244.9)  His updated medication list for this problem includes:    Levothyroxine Sodium 100 Mcg Tabs (Levothyroxine sodium) ..... One by mouth daily  Labs Reviewed: TSH: 0.30 (05/29/2010)   Free T4: 1.8 (10/31/2009)    HgBA1c: 7.0 (05/29/2010) Chol: 137 (10/31/2009)   HDL: 27.40 (10/31/2009)   LDL: 84 (02/28/2009)   TG: 119.0 (02/28/2009)  Orders: TLB-TSH (Thyroid Stimulating Hormone) (84443-TSH) TLB-T3, Free (Triiodothyronine) (84481-T3FREE) TLB-T4 (Thyrox), Free (60454-UJ8J)  Problem # 4:  DYSMETABOLIC SYNDROME (ICD-277.7)  monitering blood glucose  Orders: TLB-BMP (Basic Metabolic Panel-BMET) (80048-METABOL) Venipuncture (19147)  Problem # 5:   ATRIAL FIBRILLATION (ICD-427.31) Assessment: Unchanged  His updated medication list for this problem includes:    Aspir-81 81 Mg Tbec (Aspirin) .Marland Kitchen... Take 1 tablet by mouth once a day    Coumadin 2 Mg Tabs (Warfarin sodium) ..... 2mg  once daily    Metoprolol Succinate 50 Mg Xr24h-tab (Metoprolol succinate) .Marland KitchenMarland KitchenMarland KitchenMarland Kitchen 3 tabs by mouth at bedtime  Reviewed the following: PT: 23.1 (07/08/2010)   INR: 2.7 (08/28/2010) Next Protime: 4 weeks (dated on 08/28/2010)  Problem # 6:  OSTEOARTHROSIS, LOCAL NOS, LOWER LEG (ICD-715.36)  Informed consent obtained and then the right knee joint was prepped in a sterile manor and 40 mg depo and 1/2 cc 1% lidocaine injected into the synovial space. After care discussed. Pt tolerated procedure well.  His updated medication list for this problem includes:    Aspir-81 81 Mg Tbec (Aspirin) .Marland Kitchen... Take 1 tablet by mouth once a day  Discussed use of medications, application of heat or cold, and exercises.   Orders: Depo- Medrol 40mg  (J1030)  Joint Aspirate / Injection, Large (20610)  Complete Medication List: 1)  Aspir-81 81 Mg Tbec (Aspirin) .... Take 1 tablet by mouth once a day 2)  Calcium 600-d 600-400 Mg-unit Tabs (Calcium carbonate-vitamin d) .... Two times a day 3)  Coumadin 2 Mg Tabs (Warfarin sodium) .... 2mg  once daily 4)  Lasix 40 Mg Tabs (Furosemide) .Marland Kitchen.. 1 tablet by mouth once a day 5)  Potassium Chloride Crys Cr 20 Meq Tbcr (Potassium chloride crys cr) .... Take 1 tablet once a day 6)  Levothyroxine Sodium 100 Mcg Tabs (Levothyroxine sodium) .... One by mouth daily 7)  Crestor 10 Mg Tabs (Rosuvastatin calcium) .... One by mouth daily 8)  Ramipril 10 Mg Caps (Ramipril) .Marland Kitchen.. 1 once daily 9)  Metoprolol Succinate 50 Mg Xr24h-tab (Metoprolol succinate) .... 3 tabs by mouth at bedtime  Hypertension Assessment/Plan:      The patient's hypertensive risk group is category C: Target organ damage and/or diabetes.  His calculated 10 year risk of coronary heart  disease is 27 %.  Today's blood pressure is 134/80.  His blood pressure goal is < 130/80.  Lipid Assessment/Plan:      Based on NCEP/ATP III, the patient's risk factor category is "history of coronary disease, peripheral vascular disease, cerebrovascular disease, or aortic aneurysm along with either diabetes, current smoker, or LDL > 130 plus HDL < 40 plus triglycerides > 200".  The patient's lipid goals are as follows: Total cholesterol goal is 200; LDL cholesterol goal is 100; HDL cholesterol goal is 40; Triglyceride goal is 150.  His LDL cholesterol goal has not been met.  Secondary causes for hyperlipidemia have been ruled out.  He has been counseled on adjunctive measures for lowering his cholesterol and has been provided with dietary instructions.    Patient Instructions: 1)  Please schedule a follow-up appointment in 3 months.   Orders Added: 1)  TLB-TSH (Thyroid Stimulating Hormone) [84443-TSH] 2)  TLB-T3, Free (Triiodothyronine) [29562-Z3YQMV] 3)  TLB-T4 (Thyrox), Free [78469-GE9B] 4)  TLB-BMP (Basic Metabolic Panel-BMET) [80048-METABOL] 5)  Venipuncture [28413] 6)  Est. Patient Level IV [24401] 7)  Depo- Medrol 40mg  [J1030] 8)  Joint Aspirate / Injection, Large [20610]  Appended Document: Orders Update     Clinical Lists Changes  Orders: Added new Service order of Specimen Handling (02725) - Signed

## 2010-09-26 NOTE — Miscellaneous (Signed)
Summary: Device change out  Clinical Lists Changes  Observations: Added new observation of ICDLEADSTAT4: active (04/26/2010 12:29) Added new observation of ICDLEADSER4: [id number] V (04/26/2010 12:29) Added new observation of ICDLEADMOD4: 4196  (04/26/2010 12:29) Added new observation of ICDLEADDOI4: 04/26/2010  (04/26/2010 12:29) Added new observation of ICDLEADLOC4: LV  (04/26/2010 12:29) Added new observation of ICDLEADSTAT3: capped  (04/26/2010 12:29) Added new observation of ICD IMPL DTE: 04/26/2010  (04/26/2010 12:29) Added new observation of ICD SERL#: [id number]  (04/26/2010 12:29) Added new observation of ICD MODL#: N119  (04/26/2010 12:29) Added new observation of ICDEXPLCOMM: 04/26/10 [employer] [id number] explanted  (04/26/2010 12:29)       ICD Specifications Following MD:  Lewayne Bunting, MD     ICD Vendor:  [facility]     ICD Model Number:  N119     ICD Serial Number:  [id number] ICD DOI:  04/26/2010     ICD Implanting MD:  Lewayne Bunting, MD  Lead 1:    Location: RA     DOI: 03/11/2005     Model #: [id number]     Serial #: [id number]     Status: active Lead 2:    Location: RV     DOI: 03/11/2005     Model #: [id number]     Serial #: [id number]     Status: active Lead 3:    Location: LV     DOI: 03/11/2005     Model #: [id number]     Serial #: [id number]     Status: capped Lead 4:    Location: LV     DOI: 04/26/2010     Model #: [id number]     Serial #: [id number]     Status: active  Indications::  NICM, CHF  Explantation Comments: 04/26/10 [employer] [id number] explanted  ICD Follow Up ICD Dependent:  No      Episodes Coumadin:  Yes  Brady Parameters Mode DDIR     Lower Rate Limit:  70     Upper Rate Limit 120 PAV 120     Sensed AV Delay:  120  Tachy Zones VF:  240     VT:  200     VT1:  170

## 2010-09-26 NOTE — Cardiovascular Report (Signed)
Summary: Office Visit   Office Visit   Imported By: Roderic Ovens 08/05/2010 14:07:22  _____________________________________________________________________  External Attachment:    Type:   Image     Comment:   External Document

## 2010-09-26 NOTE — Progress Notes (Signed)
Summary: samples  Phone Note Call from Patient Call back at Home Phone [phone]   Caller: [facility] mail Reason for Call: Talk to Nurse Summary of Call: needs samples of crestor 10mg  1qd or rx. Initial call taken by: Warnell Forester,  February 15, 2010 1:51 PM    Prescriptions: CRESTOR 10 MG TABS (ROSUVASTATIN CALCIUM) one by mouth daily  #30 x 3   Entered by:   Willy Eddy, LPN   Authorized by:   Stacie Glaze MD   Signed by:   Willy Eddy, LPN on 29/52/8413   Method used:   Electronically to        [facility]* (retail)       [address]       [place], [place]  [id number]       Ph: [phone]       Fax: [phone]   RxID:   [id number]

## 2010-09-26 NOTE — Assessment & Plan Note (Signed)
Summary: PC2/SL  Medications Added CALCIUM 600-D 600-400 MG-UNIT TABS (CALCIUM CARBONATE-VITAMIN D) two times a day        Visit Type:  Follow-up Primary Provider:  Stacie Glaze MD   History of Present Illness: [name] is referred today for evaluation.  He is a pleasant 77 yo man with a h/o DCM, s/p AVR, h/o atrial fib and flutter who subsequently developed a tachycardia induced CM.  He was placed on amiodarone and has maintained NSR nicely.  He underwent BiV ICD implant and his CHF symptoms improved markedly and his LV function nearly normalized.  Over the past few weeks, he has noted increasing episodes of dyspnea with exertion.  His symptoms have gone from class 1 to 2.  He denies any intercurrent ICD therapies.  Current Medications (verified): 1)  Amiodarone Hcl 200 Mg Tabs (Amiodarone Hcl) .... Once Daily 2)  Aspir-81 81 Mg Tbec (Aspirin) .... Take 1 Tablet By Mouth Once A Day 3)  Calcium 600-D 600-400 Mg-Unit Tabs (Calcium Carbonate-Vitamin D) .... Two Times A Day 4)  Coumadin 2 Mg Tabs (Warfarin Sodium) .... 2mg  Once Daily 5)  Lasix 40 Mg Tabs (Furosemide) .Marland Kitchen.. 1 Tablet By Mouth Once A Day 6)  Potassium Chloride Crys Cr 20 Meq Tbcr (Potassium Chloride Crys Cr) .... Take 1 Tablet Once A Day 7)  Levothyroxine Sodium 100 Mcg Tabs (Levothyroxine Sodium) .... One By Mouth Daily 8)  Crestor 10 Mg Tabs (Rosuvastatin Calcium) .... One By Mouth Daily 9)  Ramipril 10 Mg Caps (Ramipril) .Marland Kitchen.. 1 Once Daily  Allergies: 1)  Amoxicillin (Amoxicillin) 2)  Vytorin (Ezetimibe-Simvastatin) 3)  Bactrim Ds (Sulfamethoxazole-Trimethoprim)  Past History:  Past Medical History: Last updated: 02/12/2007 nonischemic cardiomypathy tachycardia-induced cardiomypathy left bundle branch block Congestive heart failure Atrial fibrillation/s/p ablation Anticoagulation therapy Hypertension Hypothyroidism AVR defibrillator Hyperlipidemia  Past Surgical History: Last updated:  04/13/2007 Aortic Valve Replacement:  Coronary artery bypass graft pacer/efibrillator 2006  Review of Systems       The patient complains of dyspnea on exertion.  The patient denies anorexia, chest pain, syncope, peripheral edema, and prolonged cough.    Vital Signs:  Patient profile:   77 year old male Height:      69 inches Weight:      179 pounds BMI:     26.53 Pulse rate:   60 / minute BP sitting:   124 / 74  (left arm)  Vitals Entered By: Laurance Flatten CMA (September 25, 2009 3:23 PM)  Physical Exam  General:  Well-developed,well-nourished,in no acute distress; alert,appropriate and cooperative throughout examination Head:  Normocephalic and atraumatic without obvious abnormalities. No apparent alopecia or balding. Eyes:  pupils equal and pupils round.   Mouth:  Oral mucosa and oropharynx without lesions or exudates.  Teeth in good repair. Neck:  No deformities, masses, or tenderness noted. Chest Wall:  Well healed ICD incision. Lungs:  normal respiratory effort and no wheezes.   Heart:  normal rate and regular rhythm.  A soft systolic murmur is heard best at the RUSB. Abdomen:  soft and non-tender.  no distention.   Msk:  no joint tenderness and no joint swelling.   Pulses:  R and L carotid,radial,femoral,dorsalis pedis and posterior tibial pulses are full and equal bilaterally Extremities:  trace left pedal edema and trace right pedal edema.   Neurologic:  alert & oriented X3 and gait normal.      ICD Specifications ICD Vendor:  [employer]     ICD Model Number:  H170     ICD Serial Number:  [id number] ICD DOI:  03/11/2005     ICD Implanting MD:  Lewayne Bunting, MD  Lead 1:    Location: RA     DOI: 03/11/2005     Model #: [id number]     Serial #: [id number]     Status: active Lead 2:    Location: RV     DOI: 03/11/2005     Model #: [id number]     Serial #: [id number]     Status: active Lead 3:    Location: LV     DOI: 03/11/2005     Model #: [id number]     Serial #: [id number]     Status:  active  Indications::  NICM, CHF   ICD Follow Up Remote Check?  No Battery Voltage:  2.56 V     Charge Time:  9.2 seconds     Battery Est. Longevity:  MOL2 Underlying rhythm:  dependent ICD Dependent:  Yes       ICD Device Measurements Atrium:  Impedance: 589 ohms, Threshold: 0.8 V at 0.5 msec Right Ventricle:  Impedance: 549 ohms, Threshold: 0.8 V at 0.5 msec Left Ventricle:  Threshold: 3.5 V at 1.2 msec  Episodes MS Episodes:  0     Percent Mode Switch:  0     Coumadin:  No Shock:  0     ATP:  0     Nonsustained:  0     Atrial Pacing:  100%     Ventricular Pacing:  100%  Brady Parameters Mode DDDR     Lower Rate Limit:  70     Upper Rate Limit 120 PAV 120     Sensed AV Delay:  120  Tachy Zones VF:  240     VT:  200     VT1:  170     Next Remote Date:  12/27/2009     Next Cardiology Appt Due:  09/25/2010 MD Comments:  His LV threshold is increased.  We increased his output to allow for BiV pacing.  He has no underlying rhythm.  Impression & Recommendations:  Problem # 1:  AUTOMATIC IMPLANTABLE CARDIAC DEFIBRILLATOR SITU (ICD-V45.02) His device is working normally except that his LV lead threshold has increased.  We tried to reprogram around this but are unable.  I have increased his LV output.  His device has been present almost 5 yrs and will likely last less than another year.  I have discussed the options for him and will do so further when he returns for followup.  Problem # 2:  CHRONIC SYSTOLIC HEART FAILURE (ICD-428.22) With no LV capture, his ChF symptoms have increased in severity though still class 2.  I would expect improvement with BiV capture.  He also admits to sodium indiscretion and I have strongly encouraged him to reduce his salt intake. The following medications were removed from the medication list:    Toprol Xl 50 Mg Tb24 (Metoprolol succinate) .Marland Kitchen..Marland Kitchen Two times a day His updated medication list for this problem includes:    Amiodarone Hcl 200 Mg Tabs  (Amiodarone hcl) ..... Once daily    Aspir-81 81 Mg Tbec (Aspirin) .Marland Kitchen... Take 1 tablet by mouth once a day    Coumadin 2 Mg Tabs (Warfarin sodium) ..... 2mg  once daily    Lasix 40 Mg Tabs (Furosemide) .Marland Kitchen... 1 tablet by mouth once a day    Ramipril 10 Mg Caps (Ramipril) .Marland Kitchen... 1 once daily  Problem #  3:  HYPERTENSION (ICD-401.9) Continue current meds. The following medications were removed from the medication list:    Toprol Xl 50 Mg Tb24 (Metoprolol succinate) .Marland Kitchen..Marland Kitchen Two times a day His updated medication list for this problem includes:    Aspir-81 81 Mg Tbec (Aspirin) .Marland Kitchen... Take 1 tablet by mouth once a day    Lasix 40 Mg Tabs (Furosemide) .Marland Kitchen... 1 tablet by mouth once a day    Ramipril 10 Mg Caps (Ramipril) .Marland Kitchen... 1 once daily  Patient Instructions: 1)  Your physician recommends that you schedule a follow-up appointment in: 3 months with Dr Ladona Ridgel

## 2010-09-26 NOTE — Assessment & Plan Note (Signed)
Summary: 3 month fup//ccm/PT/RCD   Vital Signs:  Patient profile:   77 year old male Height:      65 inches Weight:      174 pounds BMI:     29.06 Temp:     98.2 degrees F oral Pulse rate:   76 / minute Resp:     14 per minute BP sitting:   114 / 70  (left arm)  Vitals Entered By: Willy Eddy, LPN (May 29, 2010 2:03 PM) CC: roa, Hypertension Management Is Patient Diabetic? No   Primary Care Provider:  Stacie Glaze MD  CC:  roa and Hypertension Management.  History of Present Illness: The pt had a pacer placed The pt has gained weight He feels "tired all the time" He has never had a sleep study He does not snore  "much"  but he does awaken sleepy he is active in his yard Follow-Up Visit      This is a 77 year old man who presents for Follow-up visit.  The patient denies chest pain, palpitations, dizziness, syncope, low blood sugar symptoms, high blood sugar symptoms, edema, SOB, DOE, PND, and orthopnea.  Since the last visit the patient notes a recent hospitilization and being seen by a specialist.  The patient reports taking meds as prescribed.  When questioned about possible medication side effects, the patient notes fatigue.    Hypertension History:      He denies headache, chest pain, palpitations, dyspnea with exertion, orthopnea, PND, peripheral edema, visual symptoms, neurologic problems, syncope, and side effects from treatment.        Positive major cardiovascular risk factors include male age 75 years old or older, diabetes, hyperlipidemia, and hypertension.  Negative major cardiovascular risk factors include non-tobacco-user status.        Positive history for target organ damage include cardiac end organ damage (either CHF or LVH) and peripheral vascular disease.  Further assessment for target organ damage reveals no history of ASHD or stroke/TIA.     Preventive Screening-Counseling & Management  Alcohol-Tobacco     Smoking Status: quit     Year  Started: smoked for 28 years     Tobacco Counseling: not indicated; no tobacco use  Problems Prior to Update: 1)  Chronic Systolic Heart Failure  (ICD-428.22) 2)  Automatic Implantable Cardiac Defibrillator Situ  (ICD-V45.02) 3)  Back Pain, Chronic, Intermittent  (ICD-724.5) 4)  Cellulitis and Abscess of Upper Arm and Forearm  (ICD-682.3) 5)  Cough  (ICD-786.2) 6)  Encounter For Therapeutic Drug Monitoring  (ICD-V58.83) 7)  Renal Insufficiency, Chronic  (ICD-585.9) 8)  Diverticulosis of Colon  (ICD-562.10) 9)  Dysmetabolic Syndrome  (ICD-277.7) 10)  Family History of Cad Male 1st Degree Relative <50  (ICD-V17.3) 11)  Osteoarthrosis, Local Nos, Lower Leg  (ICD-715.36) 12)  Hyperlipidemia  (ICD-272.4) 13)  Hypothyroidism  (ICD-244.9) 14)  Hypertension  (ICD-401.9) 15)  Anticoagulation Therapy  (ICD-V58.61) 16)  Atrial Fibrillation  (ICD-427.31) 17)  Congestive Heart Failure  (ICD-428.0)  Current Problems (verified): 1)  Chronic Systolic Heart Failure  (ICD-428.22) 2)  Automatic Implantable Cardiac Defibrillator Situ  (ICD-V45.02) 3)  Back Pain, Chronic, Intermittent  (ICD-724.5) 4)  Cellulitis and Abscess of Upper Arm and Forearm  (ICD-682.3) 5)  Cough  (ICD-786.2) 6)  Encounter For Therapeutic Drug Monitoring  (ICD-V58.83) 7)  Renal Insufficiency, Chronic  (ICD-585.9) 8)  Diverticulosis of Colon  (ICD-562.10) 9)  Dysmetabolic Syndrome  (ICD-277.7) 10)  Family History of Cad Male 1st Degree Relative <  50  (ICD-V17.3) 11)  Osteoarthrosis, Local Nos, Lower Leg  (ICD-715.36) 12)  Hyperlipidemia  (ICD-272.4) 13)  Hypothyroidism  (ICD-244.9) 14)  Hypertension  (ICD-401.9) 15)  Anticoagulation Therapy  (ICD-V58.61) 16)  Atrial Fibrillation  (ICD-427.31) 17)  Congestive Heart Failure  (ICD-428.0)  Medications Prior to Update: 1)  Aspir-81 81 Mg Tbec (Aspirin) .... Take 1 Tablet By Mouth Once A Day 2)  Calcium 600-D 600-400 Mg-Unit Tabs (Calcium Carbonate-Vitamin D) .... Two Times A  Day 3)  Coumadin 2 Mg Tabs (Warfarin Sodium) .... 2mg  Once Daily 4)  Lasix 40 Mg Tabs (Furosemide) .Marland Kitchen.. 1 Tablet By Mouth Once A Day 5)  Potassium Chloride Crys Cr 20 Meq Tbcr (Potassium Chloride Crys Cr) .... Take 1 Tablet Once A Day 6)  Levothyroxine Sodium 100 Mcg Tabs (Levothyroxine Sodium) .... One By Mouth Daily 7)  Crestor 10 Mg Tabs (Rosuvastatin Calcium) .... One By Mouth Daily 8)  Ramipril 10 Mg Caps (Ramipril) .Marland Kitchen.. 1 Once Daily 9)  Metoprolol Succinate 50 Mg Xr24h-Tab (Metoprolol Succinate) .... 3 Tabs By Mouth At Bedtime  Current Medications (verified): 1)  Aspir-81 81 Mg Tbec (Aspirin) .... Take 1 Tablet By Mouth Once A Day 2)  Calcium 600-D 600-400 Mg-Unit Tabs (Calcium Carbonate-Vitamin D) .... Two Times A Day 3)  Coumadin 2 Mg Tabs (Warfarin Sodium) .... 2mg  Once Daily 4)  Lasix 40 Mg Tabs (Furosemide) .Marland Kitchen.. 1 Tablet By Mouth Once A Day 5)  Potassium Chloride Crys Cr 20 Meq Tbcr (Potassium Chloride Crys Cr) .... Take 1 Tablet Once A Day 6)  Levothyroxine Sodium 100 Mcg Tabs (Levothyroxine Sodium) .... One By Mouth Daily 7)  Crestor 10 Mg Tabs (Rosuvastatin Calcium) .... One By Mouth Daily 8)  Ramipril 10 Mg Caps (Ramipril) .Marland Kitchen.. 1 Once Daily 9)  Metoprolol Succinate 50 Mg Xr24h-Tab (Metoprolol Succinate) .... 3 Tabs By Mouth At Bedtime  Allergies (verified): 1)  ! Penicillin 2)  Amoxicillin (Amoxicillin) 3)  Bactrim Ds (Sulfamethoxazole-Trimethoprim) 4)  Vytorin (Ezetimibe-Simvastatin)  Past History:  Past medical, surgical, family and social histories (including risk factors) reviewed, and no changes noted (except as noted below).  Past Medical History: Reviewed history from 02/12/2007 and no changes required. nonischemic cardiomypathy tachycardia-induced cardiomypathy left bundle branch block Congestive heart failure Atrial fibrillation/s/p ablation Anticoagulation therapy Hypertension Hypothyroidism AVR defibrillator Hyperlipidemia  Past Surgical  History: Reviewed history from 04/13/2007 and no changes required. Aortic Valve Replacement:  Coronary artery bypass graft pacer/efibrillator 2006  Family History: Reviewed history from 04/13/2007 and no changes required. father Family History of CAD Male 1st degree relative <50 Family History Lung cancer  Social History: Reviewed history from 04/13/2007 and no changes required. Retired Married Former Smoker quit for 28 years  Review of Systems  The patient denies anorexia, fever, weight loss, weight gain, vision loss, decreased hearing, hoarseness, chest pain, syncope, dyspnea on exertion, peripheral edema, prolonged cough, headaches, hemoptysis, abdominal pain, melena, hematochezia, severe indigestion/heartburn, hematuria, incontinence, genital sores, muscle weakness, suspicious skin lesions, transient blindness, difficulty walking, depression, unusual weight change, abnormal bleeding, enlarged lymph nodes, angioedema, and breast masses.         Flu Vaccine Consent Questions     Do you have a history of severe allergic reactions to this vaccine? no    Any prior history of allergic reactions to egg and/or gelatin? no    Do you have a sensitivity to the preservative Thimersol? no    Do you have a past history of Guillan-Barre Syndrome? no    Do you  currently have an acute febrile illness? no    Have you ever had a severe reaction to latex? no    Vaccine information given and explained to patient? yes    Are you currently pregnant? no    Lot Number:[id number]   Exp Date:02/22/2011   Site Given  Left Deltoid IM    Physical Exam  General:  alert, well-developed, and dusky.   Head:  no abnormalities observed and no abnormalities palpated.   Eyes:  pupils equal and pupils round.   Ears:  R ear normal and L ear normal.   Nose:  no external deformity and no nasal discharge.   Neck:  No deformities, masses, or tenderness noted. Lungs:  normal respiratory effort.   slight end exp  wheezing no rales Heart:  irregular rhythm.   Abdomen:  soft and non-tender.   Msk:  no joint swelling and no joint warmth.   Extremities:  trace left pedal edema and trace right pedal edema.   Neurologic:  alert & oriented X3 and finger-to-nose normal.     Impression & Recommendations:  Problem # 1:  DYSMETABOLIC SYNDROME (ICD-277.7)  weigth loss needed, moniter a1c  Orders: Venipuncture ([address]) TLB-A1C / Hgb A1C (Glycohemoglobin) (83036-A1C)  Problem # 2:  CHRONIC SYSTOLIC HEART FAILURE (ICD-428.22) much improved with less exertional SOB His updated medication list for this problem includes:    Aspir-81 81 Mg Tbec (Aspirin) .Marland Kitchen... Take 1 tablet by mouth once a day    Coumadin 2 Mg Tabs (Warfarin sodium) ..... 2mg  once daily    Lasix 40 Mg Tabs (Furosemide) .Marland Kitchen... 1 tablet by mouth once a day    Ramipril 10 Mg Caps (Ramipril) .Marland Kitchen... 1 once daily    Metoprolol Succinate 50 Mg Xr24h-tab (Metoprolol succinate) .Marland KitchenMarland KitchenMarland KitchenMarland Kitchen 3 tabs by mouth at bedtime  Problem # 3:  HYPOTHYROIDISM (ICD-244.9) Assessment: Unchanged  His updated medication list for this problem includes:    Levothyroxine Sodium 100 Mcg Tabs (Levothyroxine sodium) ..... One by mouth daily  Labs Reviewed: TSH: 1.38 (10/31/2009)   Free T4: 1.8 (10/31/2009)    Chol: 137 (10/31/2009)   HDL: 27.40 (10/31/2009)   LDL: 84 (02/28/2009)   TG: 119.0 (02/28/2009)  Orders: TLB-TSH (Thyroid Stimulating Hormone) (84443-TSH)  Problem # 4:  HYPERTENSION (ICD-401.9) Assessment: Unchanged  His updated medication list for this problem includes:    Lasix 40 Mg Tabs (Furosemide) .Marland Kitchen... 1 tablet by mouth once a day    Ramipril 10 Mg Caps (Ramipril) .Marland Kitchen... 1 once daily    Metoprolol Succinate 50 Mg Xr24h-tab (Metoprolol succinate) .Marland KitchenMarland KitchenMarland KitchenMarland Kitchen 3 tabs by mouth at bedtime  BP today: 114/70 Prior BP: 124/76 (05/15/2010)  10 Yr Risk Heart Disease: 22 % Prior 10 Yr Risk Heart Disease: 27 % (10/31/2009)  Labs Reviewed: K+: 4.5 (04/18/2010) Creat:  : 1.2 (04/18/2010)   Chol: 137 (10/31/2009)   HDL: 27.40 (10/31/2009)   LDL: 84 (02/28/2009)   TG: 119.0 (02/28/2009)  Problem # 5:  ? of COPD (ICD-496)  if remian sod will obtain pfts next OV  Vaccines Reviewed: Flu Vax: Fluvax 3+ (05/29/2010)  Complete Medication List: 1)  Aspir-81 81 Mg Tbec (Aspirin) .... Take 1 tablet by mouth once a day 2)  Calcium 600-d 600-400 Mg-unit Tabs (Calcium carbonate-vitamin d) .... Two times a day 3)  Coumadin 2 Mg Tabs (Warfarin sodium) .... 2mg  once daily 4)  Lasix 40 Mg Tabs (Furosemide) .Marland Kitchen.. 1 tablet by mouth once a day 5)  Potassium Chloride Crys Cr 20 Meq Tbcr (Potassium  chloride crys cr) .... Take 1 tablet once a day 6)  Levothyroxine Sodium 100 Mcg Tabs (Levothyroxine sodium) .... One by mouth daily 7)  Crestor 10 Mg Tabs (Rosuvastatin calcium) .... One by mouth daily 8)  Ramipril 10 Mg Caps (Ramipril) .Marland Kitchen.. 1 once daily 9)  Metoprolol Succinate 50 Mg Xr24h-tab (Metoprolol succinate) .... 3 tabs by mouth at bedtime  Other Orders: Protime (60454UJ) Fingerstick (81191) Flu Vaccine 38yrs + MEDICARE PATIENTS (Y7829) Administration Flu vaccine - MCR (G0008) TLB-BMP (Basic Metabolic Panel-BMET) (80048-METABOL)  Hypertension Assessment/Plan:      The patient's hypertensive risk group is category C: Target organ damage and/or diabetes.  His calculated 10 year risk of coronary heart disease is 22 %.  Today's blood pressure is 114/70.  His blood pressure goal is < 130/80.  Patient Instructions: 1)  Consider PTFs next OV if remains "tired" 2)  gated exercize recomended and return to "gym" 3)  Please schedule a follow-up appointment in 2 months.  Laboratory Results   Blood Tests   Date/Time Recieved: May 29, 2010 1:19 PM  Date/Time Reported: May 29, 2010 1:19 PM    INR: 2.3   (Normal Range: 0.88-1.12   Therap INR: 2.0-3.5) Comments: Wynona Canes, CMA  May 29, 2010 1:19 PM       ANTICOAGULATION RECORD PREVIOUS REGIMEN &  LAB RESULTS Anticoagulation Diagnosis:  v58.83,v58.61,427.31 on  07/18/2008 Previous INR Goal Range:  2.0-3.0 on  07/18/2008 Previous INR:  1.8 on  05/09/2010 Previous Coumadin Dose(mg):  2mg  QD on  02/05/2009 Previous Regimen:  same on  05/02/2010 Previous Coagulation Comments:  Hold for 7 days then 2mg  qd on  09/22/2008  NEW REGIMEN & LAB RESULTS Current INR: 2.3 Regimen: same  (no change)       Repeat testing in: 4 weeks MEDICATIONS ASPIR-81 81 MG TBEC (ASPIRIN) Take 1 tablet by mouth once a day CALCIUM 600-D 600-400 MG-UNIT TABS (CALCIUM CARBONATE-VITAMIN D) two times a day COUMADIN 2 MG TABS (WARFARIN SODIUM) 2mg  once daily LASIX 40 MG TABS (FUROSEMIDE) 1 tablet by mouth once a day POTASSIUM CHLORIDE CRYS CR 20 MEQ TBCR (POTASSIUM CHLORIDE CRYS CR) Take 1 tablet once a day LEVOTHYROXINE SODIUM 100 MCG TABS (LEVOTHYROXINE SODIUM) one by mouth daily CRESTOR 10 MG TABS (ROSUVASTATIN CALCIUM) one by mouth daily RAMIPRIL 10 MG CAPS (RAMIPRIL) 1 once daily METOPROLOL SUCCINATE 50 MG XR24H-TAB (METOPROLOL SUCCINATE) 3 tabs by mouth at bedtime   Anticoagulation Visit Questionnaire      Coumadin dose missed/changed:  No      Abnormal Bleeding Symptoms:  No   Any diet changes including alcohol intake, vegetables or greens since the last visit:  No Any illnesses or hospitalizations since the last visit:  No Any signs of clotting since the last visit (including chest discomfort, dizziness, shortness of breath, arm tingling, slurred speech, swelling or redness in leg):  No

## 2010-09-26 NOTE — Cardiovascular Report (Signed)
Summary: Office Visit Remote  Office Visit Remote   Imported By: Roderic Ovens 02/05/2010 11:06:22  _____________________________________________________________________  External Attachment:    Type:   Image     Comment:   External Document

## 2010-09-26 NOTE — Letter (Signed)
Summary: Implantable Device Instructions  [facility]  [address]   [place], [place] [address]   Phone: [phone]  Fax: [phone]      Implantable Device Instructions  You are scheduled for:   _____ Generator Change with possible LV lead revsion and possible AV node ablation  on 04/26/10 with Dr. Ladona Ridgel.  1.  Please arrive at the Short Stay Center at [facility] at 5:30am on the day of your procedure.  2.  Do not eat or drink after midnight the night before your procedure.  3.  Complete lab work on 04/18/10.   You do not have to be fasting.  4.  Do NOT take these medications for 1 day prior to your procedure:  Coumadin.  5.  Plan for an overnight stay.  Bring your insurance cards and a list of your medications.  6.  Wash your chest and neck with antibacterial soap (any brand) the evening before and the morning of your procedure.  Rinse well.  *If you have ANY questions after you get home, please call the office [phone]. Anselm Pancoast  *Every attempt is made to prevent procedures from being rescheduled.  Due to the nauture of Electrophysiology, rescheduling can happen.  The physician is always aware and directs the staff when this occurs.

## 2010-09-26 NOTE — Letter (Signed)
Summary: Cardioversion/TEE Instructions  Architectural technologist, Main Office  [address]   [place], [place] [address]   Phone: [phone]  Fax: [phone]    Cardioversion  Instructions  You are scheduled for a Cardioversion  on 01/18/10 with Dr. Ladona Ridgel.   Please arrive at the [facility] at 8:00 a.m. on the day of your procedure.  1)   DIET:  A)   Nothing to eat or drink after midnight except your medications with a sip of water.   2)   Come to the [place] office on 01/14/10 at 10:30am for lab work. You do not have to be fasting.  3)   MAKE SURE YOU TAKE YOUR COUMADIN.    4)   A)  DO NOT TAKE these medications before your procedure: Lasix   B)   YOU MAY TAKE ALL of your remaining medications with a small amount of water.    C)   START NEW medications:  Increase your Amiodarone to 400mg  daily     5)  Must have a responsible person to drive you home.  6)   Bring a current list of your medications and current insurance cards.   * Special Note:  Every effort is made to have your procedure done on time. Occasionally there are emergencies that present themselves at the hospital that may cause delays. Please be patient if a delay does occur.  * If you have any questions after you get home, please call the office at [phone]. Anselm Pancoast

## 2010-09-26 NOTE — Assessment & Plan Note (Signed)
Summary: pc2 sl  Medications Added AMIODARONE HCL 200 MG TABS (AMIODARONE HCL) take twodaily      Allergies Added:   Visit Type:  Follow-up Primary Provider:  Stacie Glaze MD   History of Present Illness: [name] returns  today for evaluation.  He is a pleasant 77 yo man with a h/o DCM, s/p AVR, h/o atrial fib and flutter who subsequently developed a tachycardia induced CM.  He was placed on amiodarone and has maintained NSR nicely.  He underwent BiV ICD implant and his CHF symptoms improved markedly and his LV function nearly normalized.  Over the past few weeks, he has noted increasing episodes of dyspnea with exertion.  His symptoms have gone from class 1 to 2.  He denies any intercurrent ICD therapies.  He denies peripheral edema.  Current Medications (verified): 1)  Amiodarone Hcl 200 Mg Tabs (Amiodarone Hcl) .... Once Daily 2)  Aspir-81 81 Mg Tbec (Aspirin) .... Take 1 Tablet By Mouth Once A Day 3)  Calcium 600-D 600-400 Mg-Unit Tabs (Calcium Carbonate-Vitamin D) .... Two Times A Day 4)  Coumadin 2 Mg Tabs (Warfarin Sodium) .... 2mg  Once Daily 5)  Lasix 40 Mg Tabs (Furosemide) .Marland Kitchen.. 1 Tablet By Mouth Once A Day 6)  Potassium Chloride Crys Cr 20 Meq Tbcr (Potassium Chloride Crys Cr) .... Take 1 Tablet Once A Day 7)  Levothyroxine Sodium 100 Mcg Tabs (Levothyroxine Sodium) .... One By Mouth Daily 8)  Crestor 10 Mg Tabs (Rosuvastatin Calcium) .... One By Mouth Daily 9)  Ramipril 10 Mg Caps (Ramipril) .Marland Kitchen.. 1 Once Daily  Allergies (verified): 1)  Amoxicillin (Amoxicillin) 2)  Vytorin (Ezetimibe-Simvastatin) 3)  Bactrim Ds (Sulfamethoxazole-Trimethoprim)  Past History:  Past Medical History: Last updated: 02/12/2007 nonischemic cardiomypathy tachycardia-induced cardiomypathy left bundle branch block Congestive heart failure Atrial fibrillation/s/p ablation Anticoagulation therapy Hypertension Hypothyroidism AVR defibrillator Hyperlipidemia  Past Surgical  History: Last updated: 04/13/2007 Aortic Valve Replacement:  Coronary artery bypass graft pacer/efibrillator 2006  Review of Systems       The patient complains of dyspnea on exertion.  The patient denies chest pain, syncope, and peripheral edema.    Vital Signs:  Patient profile:   77 year old male Height:      69 inches Weight:      175 pounds BMI:     25.94 Pulse rate:   97 / minute BP sitting:   132 / 84  (left arm)  Vitals Entered By: Laurance Flatten CMA (Jan 01, 2010 3:52 PM)  Physical Exam  General:  alert, well-developed, and dusky.   Head:  no abnormalities observed and no abnormalities palpated.   Eyes:  pupils equal and pupils round.   Mouth:  Oral mucosa and oropharynx without lesions or exudates.  Teeth in good repair. Neck:  No deformities, masses, or tenderness noted. Chest Wall:  Well healed ICD incision. Lungs:  normal respiratory effort and no wheezes.   Heart:  normal rate and regular rhythm.   Abdomen:  soft and non-tender.   Msk:  normal ROM and no joint deformities.   Pulses:  R and L carotid,radial,femoral,dorsalis pedis and posterior tibial pulses are full and equal bilaterally Extremities:  No clubbing, cyanosis, edema, or deformity noted with normal full range of motion of all joints.   Neurologic:  alert & oriented X3 and finger-to-nose normal.      ICD Specifications Following MD:  Lewayne Bunting, MD     ICD Vendor:  [facility] [employer]     ICD Model  Number:  H170     ICD Serial Number:  [id number] ICD DOI:  03/11/2005     ICD Implanting MD:  Lewayne Bunting, MD  Lead 1:    Location: RA     DOI: 03/11/2005     Model #: [id number]     Serial #: [id number]     Status: active Lead 2:    Location: RV     DOI: 03/11/2005     Model #: [id number]     Serial #: [id number]     Status: active Lead 3:    Location: LV     DOI: 03/11/2005     Model #: [id number]     Serial #: [id number]     Status: active  Indications::  NICM, CHF   ICD Follow Up Remote Check?  No Battery Voltage:  2.55 V      Charge Time:  10.8 seconds     Battery Est. Longevity:  MOL2 Underlying rhythm:  A-flutter ICD Dependent:  No       ICD Device Measurements Atrium:  Amplitude: 1.8 mV, Impedance: 567 ohms,  Right Ventricle:  Amplitude: 17.6 mV, Impedance: 530 ohms, Threshold: 0.8 V at 0.5 msec Left Ventricle:  Amplitude: 2.9 mV, Impedance: 448 ohms, Threshold: 4.0 V at 1.2 msec Shock Impedance: 41 ohms   Episodes MS Episodes:  15.K     Percent Mode Switch:  11%     Coumadin:  Yes Shock:  0     ATP:  0     Nonsustained:  0     Atrial Pacing:  69%     Ventricular Pacing:  89%  Brady Parameters Mode DDDR     Lower Rate Limit:  70     Upper Rate Limit 120 PAV 120     Sensed AV Delay:  120  Tachy Zones VF:  240     VT:  200     VT1:  170     Next Remote Date:  03/25/2010     Next Cardiology Appt Due:  12/24/2010 Tech Comments:  No parameter changes.  A-flutter today, + coumadin.  Latitude transmissions every 3 months.  ROV 1 year with Dr. Ladona Ridgel. Altha Harm, LPN  Jan 01, 2010 4:08 PM  MD Comments:  Agree with above.  Flutter/fib could not be pace terminated.  Impression & Recommendations:  Problem # 1:  ATRIAL FIBRILLATION (ICD-427.31) The patient has gone back out of rhythm and I suspect this explains much of his symptoms.  Will uptitrate his meds and ask him to return for DCCV. His updated medication list for this problem includes:    Amiodarone Hcl 200 Mg Tabs (Amiodarone hcl) .Marland Kitchen... Take twodaily    Aspir-81 81 Mg Tbec (Aspirin) .Marland Kitchen... Take 1 tablet by mouth once a day    Coumadin 2 Mg Tabs (Warfarin sodium) ..... 2mg  once daily  Problem # 2:  CHRONIC SYSTOLIC HEART FAILURE (ICD-428.22) His CHF is not well controlled.  Will plan to DCCV as soon as possible. His updated medication list for this problem includes:    Amiodarone Hcl 200 Mg Tabs (Amiodarone hcl) .Marland Kitchen... Take twodaily    Aspir-81 81 Mg Tbec (Aspirin) .Marland Kitchen... Take 1 tablet by mouth once a day    Coumadin 2 Mg Tabs (Warfarin sodium) .....  2mg  once daily    Lasix 40 Mg Tabs (Furosemide) .Marland Kitchen... 1 tablet by mouth once a day    Ramipril 10 Mg Caps (Ramipril) .Marland Kitchen... 1 once daily  Patient Instructions:  1)  Your physician has recommended you make the following change in your medication: increase Amiodarone to 400mg  daily  2)  Your physician has recommended that you have a cardioversion (DCCV).  Electrical cardioversion uses a jolt of electricity to your heart either through paddles or wired patches attached to your chest. This is a controlled, usually prescheduled, procedure. Defibrillation is done under light anesthesia in the hospital, and you usually go home the day of the procedure. This is done to get your heart back into a normal rhythm. You are not awake for the procedure. Please see the instruction sheet given to you today. 3)  Your physician recommends that you schedule a follow-up appointment in: 12 months with Dr Ladona Ridgel Prescriptions: AMIODARONE HCL 200 MG TABS (AMIODARONE HCL) take twodaily  #60 x 2   Entered by:   Dennis Bast, RN, BSN   Authorized by:   Laren Boom, MD, [facility]   Signed by:   Dennis Bast, RN, BSN on 01/01/2010   Method used:   Electronically to        [facility]* (retail)       [address]       [place], [place]  [id number]       Ph: [phone]       Fax: [phone]   RxID:   [id number]   Appended Document: pc2 sl His LV threshold remains elevated but unchanged from prior visit.

## 2010-09-26 NOTE — Procedures (Signed)
Summary: [facility]      Allergies Added:   Current Medications (verified): 1)  Aspir-81 81 Mg Tbec (Aspirin) .... Take 1 Tablet By Mouth Once A Day 2)  Calcium 600-D 600-400 Mg-Unit Tabs (Calcium Carbonate-Vitamin D) .... Two Times A Day 3)  Coumadin 2 Mg Tabs (Warfarin Sodium) .... 2mg  Once Daily 4)  Lasix 40 Mg Tabs (Furosemide) .Marland Kitchen.. 1 Tablet By Mouth Once A Day 5)  Potassium Chloride Crys Cr 20 Meq Tbcr (Potassium Chloride Crys Cr) .... Take 1 Tablet Once A Day 6)  Levothyroxine Sodium 100 Mcg Tabs (Levothyroxine Sodium) .... One By Mouth Daily 7)  Crestor 10 Mg Tabs (Rosuvastatin Calcium) .... One By Mouth Daily 8)  Ramipril 10 Mg Caps (Ramipril) .Marland Kitchen.. 1 Once Daily 9)  Metoprolol Succinate 50 Mg Xr24h-Tab (Metoprolol Succinate) .... 3 Tabs By Mouth At Bedtime  Allergies (verified): 1)  ! Penicillin 2)  Amoxicillin (Amoxicillin) 3)  Bactrim Ds (Sulfamethoxazole-Trimethoprim) 4)  Vytorin (Ezetimibe-Simvastatin)   ICD Specifications Following MD:  Lewayne Bunting, MD     ICD Vendor:  [facility] [employer]     ICD Model Number:  [phone]     ICD Serial Number:  [id number] ICD DOI:  04/26/2010     ICD Implanting MD:  Lewayne Bunting, MD  Lead 1:    Location: RA     DOI: 03/11/2005     Model #: 0981     Serial #: [id number]     Status: active Lead 2:    Location: RV     DOI: 03/11/2005     Model #: 2956     Serial #: [id number]     Status: active Lead 3:    Location: LV     DOI: 03/11/2005     Model #: 4543     Serial #: [id number]     Status: capped Lead 4:    Location: LV     DOI: 04/26/2010     Model #: [id number]     Serial #: [id number]     Status: active  Indications::  NICM, CHF  Explantation Comments: 04/26/10 [employer] [id number] explanted  ICD Follow Up Battery Voltage:  OJ V     Charge Time:  8.4 seconds     Battery Est. Longevity:  7 YRS Underlying rhythm:  AF ICD Dependent:  No       ICD Device Measurements Atrium:  Amplitude: 2.6 mV, Impedance: 559 ohms,  Right Ventricle:   Amplitude: 22.3 mV, Impedance: 473 ohms, Threshold: 0.8 V at 0.4 msec Left Ventricle:  Amplitude: 15.7 mV, Impedance: 512 ohms, Threshold: 0.7 V at 0.4 msec Shock Impedance: 51 ohms   Episodes Coumadin:  Yes Shock:  0     ATP:  0     Nonsustained:  0     Atrial Therapies:  0 Ventricular Pacing:  58%  Brady Parameters Mode DDIR     Lower Rate Limit:  70     Upper Rate Limit 120 PAV 180     Sensed AV Delay:  120  Tachy Zones VF:  240     VT:  200     VT1:  170     Next Cardiology Appt Due:  07/25/2010 Tech Comments:  GEN CHANGE OUT ON 04-26-10. NO REDNESS OR SWELLING AT SITE.  NORMAL DEVICE FUNCTION.  NO CHANGES MADE.  ROV IN 3 MTHS W/GT. PT WAITING TO HEAR BACK FROM [facility] ABOUT TRANSMITTER/SCALES/BP CUFF.  INFORMED PT TO CALL IF NEEDS  ANY HELP. Vella Kohler  May 15, 2010 2:12 PM

## 2010-09-26 NOTE — Cardiovascular Report (Signed)
Summary: Office Visit   Office Visit   Imported By: Roderic Ovens 10/02/2009 11:32:33  _____________________________________________________________________  External Attachment:    Type:   Image     Comment:   External Document

## 2010-09-26 NOTE — Assessment & Plan Note (Signed)
Summary: 3 month rov./njr   Vital Signs:  Patient profile:   77 year old male Height:      69 inches Weight:      208 pounds BMI:     30.83 Temp:     98.2 degrees F oral Pulse rate:   76 / minute Resp:     14 per minute BP sitting:   130 / 80  (left arm)  Vitals Entered By: Willy Eddy, LPN (October 31, 9145 1:55 PM) CC: roa, Hypertension Management   Primary Care Provider:  Stacie Glaze MD  CC:  roa and Hypertension Management.  History of Present Illness: persistant pigmentation change from  his medicatons recent flair of gout in right foot ( risk lasix) the pt is on coumadin and cannot take motrin can take colchine no excessive fluid retention or signed of chf rate controlled and bradycardia better with stopping the metoprolol  Hypertension History:      He denies headache, chest pain, palpitations, dyspnea with exertion, orthopnea, PND, peripheral edema, visual symptoms, neurologic problems, syncope, and side effects from treatment.        Positive major cardiovascular risk factors include male age 32 years old or older, diabetes, hyperlipidemia, and hypertension.  Negative major cardiovascular risk factors include non-tobacco-user status.        Positive history for target organ damage include cardiac end organ damage (either CHF or LVH) and peripheral vascular disease.  Further assessment for target organ damage reveals no history of ASHD or stroke/TIA.     Preventive Screening-Counseling & Management  Alcohol-Tobacco     Smoking Status: quit     Year Started: smoked for 28 years  Problems Prior to Update: 1)  Chronic Systolic Heart Failure  (ICD-428.22) 2)  Automatic Implantable Cardiac Defibrillator Situ  (ICD-V45.02) 3)  Back Pain, Chronic, Intermittent  (ICD-724.5) 4)  Cellulitis and Abscess of Upper Arm and Forearm  (ICD-682.3) 5)  Cough  (ICD-786.2) 6)  Encounter For Therapeutic Drug Monitoring  (ICD-V58.83) 7)  Renal Insufficiency, Chronic   (ICD-585.9) 8)  Diverticulosis of Colon  (ICD-562.10) 9)  Dysmetabolic Syndrome  (ICD-277.7) 10)  Family History of Cad Male 1st Degree Relative <50  (ICD-V17.3) 11)  Osteoarthrosis, Local Nos, Lower Leg  (ICD-715.36) 12)  Hyperlipidemia  (ICD-272.4) 13)  Hypothyroidism  (ICD-244.9) 14)  Hypertension  (ICD-401.9) 15)  Anticoagulation Therapy  (ICD-V58.61) 16)  Atrial Fibrillation  (ICD-427.31) 17)  Congestive Heart Failure  (ICD-428.0)  Current Problems (verified): 1)  Chronic Systolic Heart Failure  (ICD-428.22) 2)  Automatic Implantable Cardiac Defibrillator Situ  (ICD-V45.02) 3)  Back Pain, Chronic, Intermittent  (ICD-724.5) 4)  Cellulitis and Abscess of Upper Arm and Forearm  (ICD-682.3) 5)  Cough  (ICD-786.2) 6)  Encounter For Therapeutic Drug Monitoring  (ICD-V58.83) 7)  Renal Insufficiency, Chronic  (ICD-585.9) 8)  Diverticulosis of Colon  (ICD-562.10) 9)  Dysmetabolic Syndrome  (ICD-277.7) 10)  Family History of Cad Male 1st Degree Relative <50  (ICD-V17.3) 11)  Osteoarthrosis, Local Nos, Lower Leg  (ICD-715.36) 12)  Hyperlipidemia  (ICD-272.4) 13)  Hypothyroidism  (ICD-244.9) 14)  Hypertension  (ICD-401.9) 15)  Anticoagulation Therapy  (ICD-V58.61) 16)  Atrial Fibrillation  (ICD-427.31) 17)  Congestive Heart Failure  (ICD-428.0)  Medications Prior to Update: 1)  Amiodarone Hcl 200 Mg Tabs (Amiodarone Hcl) .... Once Daily 2)  Aspir-81 81 Mg Tbec (Aspirin) .... Take 1 Tablet By Mouth Once A Day 3)  Calcium 600-D 600-400 Mg-Unit Tabs (Calcium Carbonate-Vitamin D) .... Two Times  A Day 4)  Coumadin 2 Mg Tabs (Warfarin Sodium) .... 2mg  Once Daily 5)  Lasix 40 Mg Tabs (Furosemide) .Marland Kitchen.. 1 Tablet By Mouth Once A Day 6)  Potassium Chloride Crys Cr 20 Meq Tbcr (Potassium Chloride Crys Cr) .... Take 1 Tablet Once A Day 7)  Levothyroxine Sodium 100 Mcg Tabs (Levothyroxine Sodium) .... One By Mouth Daily 8)  Crestor 10 Mg Tabs (Rosuvastatin Calcium) .... One By Mouth Daily 9)   Ramipril 10 Mg Caps (Ramipril) .Marland Kitchen.. 1 Once Daily  Current Medications (verified): 1)  Amiodarone Hcl 200 Mg Tabs (Amiodarone Hcl) .... Once Daily 2)  Aspir-81 81 Mg Tbec (Aspirin) .... Take 1 Tablet By Mouth Once A Day 3)  Calcium 600-D 600-400 Mg-Unit Tabs (Calcium Carbonate-Vitamin D) .... Two Times A Day 4)  Coumadin 2 Mg Tabs (Warfarin Sodium) .... 2mg  Once Daily 5)  Lasix 40 Mg Tabs (Furosemide) .Marland Kitchen.. 1 Tablet By Mouth Once A Day 6)  Potassium Chloride Crys Cr 20 Meq Tbcr (Potassium Chloride Crys Cr) .... Take 1 Tablet Once A Day 7)  Levothyroxine Sodium 100 Mcg Tabs (Levothyroxine Sodium) .... One By Mouth Daily 8)  Crestor 10 Mg Tabs (Rosuvastatin Calcium) .... One By Mouth Daily 9)  Ramipril 10 Mg Caps (Ramipril) .Marland Kitchen.. 1 Once Daily  Allergies (verified): 1)  Amoxicillin (Amoxicillin) 2)  Vytorin (Ezetimibe-Simvastatin) 3)  Bactrim Ds (Sulfamethoxazole-Trimethoprim)  Past History:  Family History: Last updated: 04/13/2007 father Family History of CAD Male 1st degree relative <50 Family History Lung cancer  Social History: Last updated: 04/13/2007 Retired Married Former Smoker quit for 28 years  Risk Factors: Smoking Status: quit (10/31/2009)  Past medical, surgical, family and social histories (including risk factors) reviewed, and no changes noted (except as noted below).  Past Medical History: Reviewed history from 02/12/2007 and no changes required. nonischemic cardiomypathy tachycardia-induced cardiomypathy left bundle branch block Congestive heart failure Atrial fibrillation/s/p ablation Anticoagulation therapy Hypertension Hypothyroidism AVR defibrillator Hyperlipidemia  Past Surgical History: Reviewed history from 04/13/2007 and no changes required. Aortic Valve Replacement:  Coronary artery bypass graft pacer/efibrillator 2006  Family History: Reviewed history from 04/13/2007 and no changes required. father Family History of CAD Male 1st degree  relative <50 Family History Lung cancer  Social History: Reviewed history from 04/13/2007 and no changes required. Retired Married Former Smoker quit for 28 years  Review of Systems  The patient denies anorexia, fever, weight loss, weight gain, vision loss, decreased hearing, hoarseness, chest pain, syncope, dyspnea on exertion, peripheral edema, prolonged cough, headaches, hemoptysis, abdominal pain, melena, hematochezia, severe indigestion/heartburn, hematuria, incontinence, genital sores, muscle weakness, suspicious skin lesions, transient blindness, difficulty walking, depression, unusual weight change, abnormal bleeding, enlarged lymph nodes, angioedema, and breast masses.    Physical Exam  General:  alert, well-developed, and dusky.   Head:  no abnormalities observed and no abnormalities palpated.   Eyes:  pupils equal and pupils round.   Ears:  R ear normal and L ear normal.   Nose:  no external deformity and no nasal discharge.   Neck:  No deformities, masses, or tenderness noted. Lungs:  normal respiratory effort and no wheezes.   Heart:  normal rate and regular rhythm.   Abdomen:  soft and non-tender.   Msk:  normal ROM and no joint deformities.   Extremities:  No clubbing, cyanosis, edema, or deformity noted with normal full range of motion of all joints.   Neurologic:  alert & oriented X3 and finger-to-nose normal.     Impression & Recommendations:  Problem # 1:  CHRONIC SYSTOLIC HEART FAILURE (ICD-428.22)  stable His updated medication list for this problem includes:    Aspir-81 81 Mg Tbec (Aspirin) .Marland Kitchen... Take 1 tablet by mouth once a day    Coumadin 2 Mg Tabs (Warfarin sodium) ..... 2mg  once daily    Lasix 40 Mg Tabs (Furosemide) .Marland Kitchen... 1 tablet by mouth once a day    Ramipril 10 Mg Caps (Ramipril) .Marland Kitchen... 1 once daily  Echocardiogram:  SUMMARY   -  Left ventricular size was at the upper limits of normal. Overall         left ventricular systolic function was  normal. Left         ventricular ejection fraction was estimated to be 65 %. Left         ventricular wall thickness was mildly increased.   -  There was a Librarian, academic prosthesis. There was         trivial aortic valvular regurgitation. The mean transaortic         valve gradient was 16 mmHg.   -  The left atrium was moderately dilated.   -  There was the appearance of a catheter or pacing wire in the         right ventricle.   -  The estimated peak pulmonary artery systolic pressure was mildly         increased.   -  The right atrium was mildly dilated.    ---------------------------------------------------------------   Prepared and Electronically Authenticated by   Nicholes Mango MD (06/19/2006)  Problem # 2:  RENAL INSUFFICIENCY, CHRONIC (ICD-585.9) moniter creatinine Labs Reviewed: BUN: 20 (08/10/2009)   Cr: 1.34 (08/10/2009)    Ca++: 9.6 (08/10/2009)    TP: 6.9 (05/09/2009)   Alb: 3.2 (05/09/2009)  Problem # 3:  HYPOTHYROIDISM (ICD-244.9)  His updated medication list for this problem includes:    Levothyroxine Sodium 100 Mcg Tabs (Levothyroxine sodium) ..... One by mouth daily  Labs Reviewed: TSH: 2.867 (08/10/2009)   Total T4: 10.3 (02/12/2007)    Chol: 158 (08/10/2009)   HDL: 33 (08/10/2009)   LDL: 84 (02/28/2009)   TG: 119.0 (02/28/2009)  Orders: TLB-TSH (Thyroid Stimulating Hormone) (84443-TSH) TLB-T4 (Thyrox), Free [phone])  Problem # 4:  ATRIAL FIBRILLATION (ICD-427.31) take extra day today and then keep on the two His updated medication list for this problem includes:    Amiodarone Hcl 200 Mg Tabs (Amiodarone hcl) ..... Once daily    Aspir-81 81 Mg Tbec (Aspirin) .Marland Kitchen... Take 1 tablet by mouth once a day    Coumadin 2 Mg Tabs (Warfarin sodium) ..... 2mg  once daily  Orders: Protime (95621HY)  Reviewed the following: PT: 15.9 (10/31/2009)   INR: 1.7 (10/31/2009) Next Protime: 1 month (dated on 10/05/2009)  Complete Medication List: 1)   Amiodarone Hcl 200 Mg Tabs (Amiodarone hcl) .... Once daily 2)  Aspir-81 81 Mg Tbec (Aspirin) .... Take 1 tablet by mouth once a day 3)  Calcium 600-d 600-400 Mg-unit Tabs (Calcium carbonate-vitamin d) .... Two times a day 4)  Coumadin 2 Mg Tabs (Warfarin sodium) .... 2mg  once daily 5)  Lasix 40 Mg Tabs (Furosemide) .Marland Kitchen.. 1 tablet by mouth once a day 6)  Potassium Chloride Crys Cr 20 Meq Tbcr (Potassium chloride crys cr) .... Take 1 tablet once a day 7)  Levothyroxine Sodium 100 Mcg Tabs (Levothyroxine sodium) .... One by mouth daily 8)  Crestor 10 Mg Tabs (Rosuvastatin calcium) .... One by mouth daily 9)  Ramipril 10 Mg Caps (Ramipril) .Marland Kitchen.. 1 once daily  Other Orders: Venipuncture (98119) TLB-Cholesterol, HDL (83718-HDL) TLB-Cholesterol, Direct LDL (83721-DIRLDL) TLB-Cholesterol, Total (82465-CHO) TLB-BMP (Basic Metabolic Panel-BMET) (80048-METABOL)  Hypertension Assessment/Plan:      The patient's hypertensive risk group is category C: Target organ damage and/or diabetes.  His calculated 10 year risk of coronary heart disease is 27 %.  Today's blood pressure is 130/80.  His blood pressure goal is < 130/80.  Patient Instructions: 1)  coumadin 2 mg take extra today and then resume 2 mg daily 2)  heart rate is good today 3)  Please schedule a follow-up appointment in 4 months.  Laboratory Results   Blood Tests   Date/Time Recieved: October 31, 2009 1:45 PM  Date/Time Reported: October 31, 2009 1:45 PM   PT: 15.9 s   (Normal Range: 10.6-13.4)  INR: 1.7   (Normal Range: 0.88-1.12   Therap INR: 2.0-3.5) Comments: Wynona Canes, CMA  October 31, 2009 1:45 PM       ANTICOAGULATION RECORD PREVIOUS REGIMEN & LAB RESULTS Anticoagulation Diagnosis:  v58.83,v58.61,427.31 on  07/18/2008 Previous INR Goal Range:  2.0-3.0 on  07/18/2008 Previous INR:  2.0 on  10/05/2009 Previous Coumadin Dose(mg):  2mg  QD on  02/05/2009 Previous Regimen:  same on  10/05/2009 Previous Coagulation  Comments:  Hold for 7 days then 2mg  qd on  09/22/2008  NEW REGIMEN & LAB RESULTS Current INR: 1.7 Regimen: same  (no change)  MEDICATIONS AMIODARONE HCL 200 MG TABS (AMIODARONE HCL) once daily ASPIR-81 81 MG TBEC (ASPIRIN) Take 1 tablet by mouth once a day CALCIUM 600-D 600-400 MG-UNIT TABS (CALCIUM CARBONATE-VITAMIN D) two times a day COUMADIN 2 MG TABS (WARFARIN SODIUM) 2mg  once daily LASIX 40 MG TABS (FUROSEMIDE) 1 tablet by mouth once a day POTASSIUM CHLORIDE CRYS CR 20 MEQ TBCR (POTASSIUM CHLORIDE CRYS CR) Take 1 tablet once a day LEVOTHYROXINE SODIUM 100 MCG TABS (LEVOTHYROXINE SODIUM) one by mouth daily CRESTOR 10 MG TABS (ROSUVASTATIN CALCIUM) one by mouth daily RAMIPRIL 10 MG CAPS (RAMIPRIL) 1 once daily   Anticoagulation Visit Questionnaire      Coumadin dose missed/changed:  No      Abnormal Bleeding Symptoms:  No   Any diet changes including alcohol intake, vegetables or greens since the last visit:  No Any illnesses or hospitalizations since the last visit:  Yes Any signs of clotting since the last visit (including chest discomfort, dizziness, shortness of breath, arm tingling, slurred speech, swelling or redness in leg):  No

## 2010-09-26 NOTE — Assessment & Plan Note (Signed)
Summary: pt labs//cm   Nurse Visit   Allergies: 1)  ! Penicillin 2)  Amoxicillin (Amoxicillin) 3)  Bactrim Ds (Sulfamethoxazole-Trimethoprim) 4)  Vytorin (Ezetimibe-Simvastatin) Laboratory Results   Blood Tests      INR: 1.3   (Normal Range: 0.88-1.12   Therap INR: 2.0-3.5) Comments: Rita Ohara  May 02, 2010 11:12 AM     Orders Added: 1)  Est. Patient Level I [99211] 2)  Protime [31540GQ]   ANTICOAGULATION RECORD PREVIOUS REGIMEN & LAB RESULTS Anticoagulation Diagnosis:  v58.83,v58.61,427.31 on  07/18/2008 Previous INR Goal Range:  2.0-3.0 on  07/18/2008 Previous INR:  2.1 ratio on  04/18/2010 Previous Coumadin Dose(mg):  2mg  QD on  02/05/2009 Previous Regimen:  same on  02/27/2010 Previous Coagulation Comments:  Hold for 7 days then 2mg  qd on  09/22/2008  NEW REGIMEN & LAB RESULTS Current INR: 1.3 Regimen: same  Repeat testing in: 4 weeks  Anticoagulation Visit Questionnaire Coumadin dose missed/changed:  Yes Abnormal Bleeding Symptoms:  No  Any diet changes including alcohol intake, vegetables or greens since the last visit:  No Any illnesses or hospitalizations since the last visit:  Yes Any signs of clotting since the last visit (including chest discomfort, dizziness, shortness of breath, arm tingling, slurred speech, swelling or redness in leg):  No  MEDICATIONS ASPIR-81 81 MG TBEC (ASPIRIN) Take 1 tablet by mouth once a day CALCIUM 600-D 600-400 MG-UNIT TABS (CALCIUM CARBONATE-VITAMIN D) two times a day COUMADIN 2 MG TABS (WARFARIN SODIUM) 2mg  once daily LASIX 40 MG TABS (FUROSEMIDE) 1 tablet by mouth once a day POTASSIUM CHLORIDE CRYS CR 20 MEQ TBCR (POTASSIUM CHLORIDE CRYS CR) Take 1 tablet once a day LEVOTHYROXINE SODIUM 100 MCG TABS (LEVOTHYROXINE SODIUM) one by mouth daily CRESTOR 10 MG TABS (ROSUVASTATIN CALCIUM) one by mouth daily RAMIPRIL 10 MG CAPS (RAMIPRIL) 1 once daily METOPROLOL TARTRATE 50 MG TABS (METOPROLOL TARTRATE) Take one  tablet by mouth twice a day

## 2010-09-26 NOTE — Letter (Signed)
Summary: ELectrophysiology/Ablation Procedure Instructions  [facility], Main Office  [address]   [place], [place] [address]   Phone: [phone]  Fax: [phone]     Electrophysiology/Ablation Procedure Instructions    You are scheduled for a(n) AV node ablation on 07/12/10 at 9:30am with Dr. Ladona Ridgel.  1.  Please come to the Short Stay Center at [facility] at 7:30am on the day of your procedure.  2.  Come prepared to stay overnight.   Please bring your insurance cards and a list of your medications.  3.  Come to the [place] office on 07/08/10 for lab work.   You do not have to be fasting.  4.  Do not have anything to eat or drink after midnight the night before your procedure.  5.  Do NOT take these medications for the morning of procedure unless otherwise instructed: Furosemide and Coumadin(the night prior).  All of your remaining medications may be taken with a small amount of water.  6.  Educational material received:   Ablation   * Occasionally, EP studies and ablations can become lengthy.  Please make your family aware of this before your procedure starts.  Average time ranges from 2-8 hours for EP studies/ablations.  Your physician will locate your family after the procedure with the results.  * If you have any questions after you get home, please call the office at [phone]. Anselm Pancoast

## 2010-09-26 NOTE — Cardiovascular Report (Signed)
Summary: Office Visit   Office Visit   Imported By: Roderic Ovens 06/03/2010 11:44:42  _____________________________________________________________________  External Attachment:    Type:   Image     Comment:   External Document

## 2010-09-26 NOTE — Assessment & Plan Note (Signed)
Summary: PT/RCD   Nurse Visit   Allergies: 1)  ! Penicillin 2)  Amoxicillin (Amoxicillin) 3)  Bactrim Ds (Sulfamethoxazole-Trimethoprim) 4)  Vytorin (Ezetimibe-Simvastatin) Laboratory Results   Blood Tests      INR: 2.7   (Normal Range: 0.88-1.12   Therap INR: 2.0-3.5) Comments: Rita Ohara  August 28, 2010 3:44 PM     Orders Added: 1)  Est. Patient Level I [99211] 2)  Protime [16109UE]   ANTICOAGULATION RECORD PREVIOUS REGIMEN & LAB RESULTS Anticoagulation Diagnosis:  v58.83,v58.61,427.31 on  07/18/2008 Previous INR Goal Range:  2.0-3.0 on  07/18/2008 Previous INR:  2.6 on  08/01/2010 Previous Coumadin Dose(mg):  2mg  QD on  02/05/2009 Previous Regimen:  same on  08/01/2010 Previous Coagulation Comments:  Hold for 7 days then 2mg  qd on  09/22/2008  NEW REGIMEN & LAB RESULTS Current INR: 2.7 Regimen: same  Repeat testing in: 4 weeks  Anticoagulation Visit Questionnaire Coumadin dose missed/changed:  No Abnormal Bleeding Symptoms:  No  Any diet changes including alcohol intake, vegetables or greens since the last visit:  No Any illnesses or hospitalizations since the last visit:  No Any signs of clotting since the last visit (including chest discomfort, dizziness, shortness of breath, arm tingling, slurred speech, swelling or redness in leg):  No  MEDICATIONS ASPIR-81 81 MG TBEC (ASPIRIN) Take 1 tablet by mouth once a day CALCIUM 600-D 600-400 MG-UNIT TABS (CALCIUM CARBONATE-VITAMIN D) two times a day COUMADIN 2 MG TABS (WARFARIN SODIUM) 2mg  once daily LASIX 40 MG TABS (FUROSEMIDE) 1 tablet by mouth once a day POTASSIUM CHLORIDE CRYS CR 20 MEQ TBCR (POTASSIUM CHLORIDE CRYS CR) Take 1 tablet once a day LEVOTHYROXINE SODIUM 100 MCG TABS (LEVOTHYROXINE SODIUM) one by mouth daily CRESTOR 10 MG TABS (ROSUVASTATIN CALCIUM) one by mouth daily RAMIPRIL 10 MG CAPS (RAMIPRIL) 1 once daily METOPROLOL SUCCINATE 50 MG XR24H-TAB (METOPROLOL SUCCINATE) 3 tabs by mouth  at bedtime

## 2010-09-26 NOTE — Assessment & Plan Note (Signed)
Summary: pc2    Visit Type:  Follow-up Primary Marily Konczal:  Stacie Glaze MD   History of Present Illness: This is a 77 year old white male patient who recently underwent successful insertion of a new [employer] ICD as well as a new left ventricular pacing lead for ICD device  ERI. He has had a lot of trouble with maintaining sinus rhythm and we have settled now for only rate control. Today he notes that at rest he feels ok but with much exertion his dyspnea is worse.  No ICD shocks.  He denies peripheral edema.  His skin is improved with stopping amiodarone.   Current Medications (verified): 1)  Aspir-81 81 Mg Tbec (Aspirin) .... Take 1 Tablet By Mouth Once A Day 2)  Calcium 600-D 600-400 Mg-Unit Tabs (Calcium Carbonate-Vitamin D) .... Two Times A Day 3)  Coumadin 2 Mg Tabs (Warfarin Sodium) .... 2mg  Once Daily 4)  Lasix 40 Mg Tabs (Furosemide) .Marland Kitchen.. 1 Tablet By Mouth Once A Day 5)  Potassium Chloride Crys Cr 20 Meq Tbcr (Potassium Chloride Crys Cr) .... Take 1 Tablet Once A Day 6)  Levothyroxine Sodium 100 Mcg Tabs (Levothyroxine Sodium) .... One By Mouth Daily 7)  Crestor 10 Mg Tabs (Rosuvastatin Calcium) .... One By Mouth Daily 8)  Ramipril 10 Mg Caps (Ramipril) .Marland Kitchen.. 1 Once Daily 9)  Metoprolol Succinate 50 Mg Xr24h-Tab (Metoprolol Succinate) .... 3 Tabs By Mouth At Bedtime  Allergies: 1)  ! Penicillin 2)  Amoxicillin (Amoxicillin) 3)  Bactrim Ds (Sulfamethoxazole-Trimethoprim) 4)  Vytorin (Ezetimibe-Simvastatin)  Past History:  Past Medical History: Last updated: 02/12/2007 nonischemic cardiomypathy tachycardia-induced cardiomypathy left bundle branch block Congestive heart failure Atrial fibrillation/s/p ablation Anticoagulation therapy Hypertension Hypothyroidism AVR defibrillator Hyperlipidemia  Past Surgical History: Last updated: 04/13/2007 Aortic Valve Replacement:  Coronary artery bypass graft pacer/efibrillator 2006  Review of Systems       The  patient complains of dyspnea on exertion.  The patient denies chest pain, syncope, and peripheral edema.    Vital Signs:  Patient profile:   77 year old male Height:      65 inches Weight:      176 pounds BMI:     29.39 Pulse rate:   70 / minute BP sitting:   120 / 84  (left arm)  Vitals Entered By: Laurance Flatten CMA (July 08, 2010 3:39 PM)  Physical Exam  General:  alert, well-developed, and dusky.   Head:  no abnormalities observed and no abnormalities palpated.   Eyes:  pupils equal and pupils round.   Mouth:  Oral mucosa and oropharynx without lesions or exudates.  Teeth in good repair. Neck:  No deformities, masses, or tenderness noted. Chest Wall:  Well healed ICD incision. Lungs:  normal respiratory effort.   slight end exp wheezing no rales Heart:  irregular rhythm.   Abdomen:  soft and non-tender.   Msk:  no joint swelling and no joint warmth.   Pulses:  R and L carotid,radial,femoral,dorsalis pedis and posterior tibial pulses are full and equal bilaterally Extremities:  trace left pedal edema and trace right pedal edema.   Neurologic:  alert & oriented X3l.      ICD Specifications Following MD:  Lewayne Bunting, MD     ICD Vendor:  [facility] [employer]     ICD Model Number:  [id number]     ICD Serial Number:  [id number] ICD DOI:  04/26/2010     ICD Implanting MD:  Lewayne Bunting, MD  Lead 1:  Location: RA     DOI: 03/11/2005     Model #: [id number]     Serial #: [id number]     Status: active Lead 2:    Location: RV     DOI: 03/11/2005     Model #: [id number]     Serial #: [id number]     Status: active Lead 3:    Location: LV     DOI: 03/11/2005     Model #: [id number]     Serial #: [id number]     Status: capped Lead 4:    Location: LV     DOI: 04/26/2010     Model #: [id number]     Serial #: [id number]     Status: active  Indications::  NICM, CHF  Explantation Comments: 04/26/10 [employer] [id number] explanted  ICD Follow Up Remote Check?  No Battery Voltage:  good V     Charge Time:  8.4 seconds      Battery Est. Longevity:  7 years Underlying rhythm:  A-fib ICD Dependent:  No       ICD Device Measurements Atrium:  Amplitude: 2.8 mV, Impedance: 550 ohms,  Right Ventricle:  Amplitude: 22.0 mV, Impedance: 465 ohms, Threshold: 0.7 V at 0.4 msec Left Ventricle:  Amplitude: 14 mV, Impedance: 504 ohms, Threshold: 0.7 V at 0.4 msec  Episodes Coumadin:  Yes Shock:  0     ATP:  0     Nonsustained:  0      Brady Parameters Mode DDIR     Lower Rate Limit:  70     Upper Rate Limit 120 PAV 180     Sensed AV Delay:  120  Tachy Zones VF:  240     VT:  200     VT1:  170     Next Remote Date:  10/10/2010     Next Cardiology Appt Due:  04/26/2011 Tech Comments:  LV reprogrammed 2.0@0 .4.  A-fib, + coumadin.  Latitude transmissions every 3 months.  ROV 9/12 with Dr. Ladona Ridgel. Altha Harm, LPN  July 08, 2010 3:52 PM  MD Comments:  Agree with above.  He is over 90/min, 50% of the time and not pacing 55%.  Impression & Recommendations:  Problem # 1:  ATRIAL FIBRILLATION (ICD-427.31) His rate is not well controlled.  I have recommended AV node ablation now that the ICD is well healed.  This will be scheduled in the next few days. His updated medication list for this problem includes:    Aspir-81 81 Mg Tbec (Aspirin) .Marland Kitchen... Take 1 tablet by mouth once a day    Coumadin 2 Mg Tabs (Warfarin sodium) ..... 2mg  once daily    Metoprolol Succinate 50 Mg Xr24h-tab (Metoprolol succinate) .Marland KitchenMarland KitchenMarland KitchenMarland Kitchen 3 tabs by mouth at bedtime  Orders: TLB-BMP (Basic Metabolic Panel-BMET) (80048-METABOL) TLB-CBC Platelet - w/Differential (85025-CBCD) TLB-PT (Protime) (85610-PTP) TLB-PTT (85730-PTTL)  Problem # 2:  CHRONIC SYSTOLIC HEART FAILURE (ICD-428.22) His symptoms are class 2-3.  He will continue his current meds and await AV node ablation. His updated medication list for this problem includes:    Aspir-81 81 Mg Tbec (Aspirin) .Marland Kitchen... Take 1 tablet by mouth once a day    Coumadin 2 Mg Tabs (Warfarin sodium) ..... 2mg  once  daily    Lasix 40 Mg Tabs (Furosemide) .Marland Kitchen... 1 tablet by mouth once a day    Ramipril 10 Mg Caps (Ramipril) .Marland Kitchen... 1 once daily    Metoprolol Succinate 50 Mg Xr24h-tab (Metoprolol succinate) .Marland KitchenMarland KitchenMarland KitchenMarland Kitchen 3 tabs by  mouth at bedtime  Orders: TLB-BMP (Basic Metabolic Panel-BMET) (80048-METABOL) TLB-CBC Platelet - w/Differential (85025-CBCD) TLB-PT (Protime) (85610-PTP) TLB-PTT (85730-PTTL)  Problem # 3:  AUTOMATIC IMPLANTABLE CARDIAC DEFIBRILLATOR SITU (ICD-V45.02) His device is working normally but he appears to not be pacing much.  Will plan AV node ablation.   Orders: TLB-BMP (Basic Metabolic Panel-BMET) (80048-METABOL) TLB-CBC Platelet - w/Differential (85025-CBCD) TLB-PT (Protime) (85610-PTP) TLB-PTT (85730-PTTL)  Patient Instructions: 1)  Your physician has recommended that you have an ablation.  Catheter ablation is a medical procedure used to treat some cardiac arrhythmias (irregular heartbeats). During catheter ablation, a long, thin, flexible tube is put into a blood vessel in your groin (upper thigh), or neck. This tube is called an ablation catheter. It is then guided to your heart through the blood vessel. Radiofrequency waves destroy small areas of heart tissue where abnormal heartbeats may cause an arrhythmia to start.  Please see the instruction sheet given to you today.

## 2010-09-26 NOTE — Cardiovascular Report (Signed)
Summary: Office Visit   Office Visit   Imported By: Roderic Ovens 03/29/2010 11:22:57  _____________________________________________________________________  External Attachment:    Type:   Image     Comment:   External Document

## 2010-09-26 NOTE — Procedures (Signed)
Summary: [email or web address] [email or web address]      Allergies Added:   Current Medications (verified): 1)  Aspir-81 81 Mg Tbec (Aspirin) .... Take 1 Tablet By Mouth Once A Day 2)  Calcium 600-D 600-400 Mg-Unit Tabs (Calcium Carbonate-Vitamin D) .... Two Times A Day 3)  Coumadin 2 Mg Tabs (Warfarin Sodium) .... 2mg  Once Daily 4)  Lasix 40 Mg Tabs (Furosemide) .Marland Kitchen.. 1 Tablet By Mouth Once A Day 5)  Potassium Chloride Crys Cr 20 Meq Tbcr (Potassium Chloride Crys Cr) .... Take 1 Tablet Once A Day 6)  Levothyroxine Sodium 100 Mcg Tabs (Levothyroxine Sodium) .... One By Mouth Daily 7)  Crestor 10 Mg Tabs (Rosuvastatin Calcium) .... One By Mouth Daily 8)  Ramipril 10 Mg Caps (Ramipril) .Marland Kitchen.. 1 Once Daily 9)  Metoprolol Succinate 50 Mg Xr24h-Tab (Metoprolol Succinate) .... 3 Tabs By Mouth At Bedtime  Allergies (verified): 1)  ! Penicillin 2)  Amoxicillin (Amoxicillin) 3)  Bactrim Ds (Sulfamethoxazole-Trimethoprim) 4)  Vytorin (Ezetimibe-Simvastatin)   ICD Specifications Following MD:  Lewayne Bunting, MD     ICD Vendor:  [facility] [employer]     ICD Model Number:  [id number]     ICD Serial Number:  [id number] ICD DOI:  04/26/2010     ICD Implanting MD:  Lewayne Bunting, MD  Lead 1:    Location: RA     DOI: 03/11/2005     Model #: 0981     Serial #: [id number]     Status: active Lead 2:    Location: RV     DOI: 03/11/2005     Model #: 2956     Serial #: [id number]     Status: active Lead 3:    Location: LV     DOI: 03/11/2005     Model #: 4543     Serial #: [id number]     Status: capped Lead 4:    Location: LV     DOI: 04/26/2010     Model #: [id number]     Serial #: [id number]     Status: active  Indications::  NICM, CHF  Explantation Comments: 04/26/10 [employer] [id number] explanted  ICD Follow Up Battery Voltage:  GOOD V     Charge Time:  8.6 seconds     Battery Est. Longevity:  7 YRS Underlying rhythm:  AF ICD Dependent:  No       ICD Device Measurements Atrium:  Amplitude: 3.6 mV, Impedance: 578 ohms,  Right Ventricle:   Amplitude: PACED mV, Impedance: 496 ohms, Threshold: 0.6 V at 0.4 msec Left Ventricle:  Amplitude: PACED mV, Impedance: 561 ohms, Threshold: 0.6 V at 0.4 msec Shock Impedance: 59 ohms   Episodes MS Episodes:  0     Coumadin:  Yes Shock:  0     ATP:  0     Nonsustained:  0     Atrial Therapies:  0 Ventricular Pacing:  100%  Brady Parameters Mode VVIR     Lower Rate Limit:  80     Upper Rate Limit 120 PAV 180     Sensed AV Delay:  120  Tachy Zones VF:  240     VT:  200     VT1:  170     Next Cardiology Appt Due:  10/15/2010 Tech Comments:  NORMAL DEVICE FUNCTION.  PT IN AF 100% OF TIME. + COUMADIN. PT TO COME IN 07-29-10 @ 1300 TO HAVE LRL CHANGED FROM 80 TO 70bpm.  ROV 10-15-10 @ 1530. Vella Kohler  July 25, 2010 3:59 PM   Patient Instructions: 1)  Appointment with Dr Ladona Ridgel 10-15-10 @ 330 pm.

## 2010-09-26 NOTE — Cardiovascular Report (Signed)
Summary: [facility] Registration Record/Device Registration Sticker    [place] Registration Record/Device Registration Sticker    Imported By: Roderic Ovens 06/03/2010 11:46:34  _____________________________________________________________________  External Attachment:    Type:   Image     Comment:   External Document

## 2010-09-26 NOTE — Assessment & Plan Note (Signed)
Summary: PT/RCD   Nurse Visit   Allergies: 1)  Amoxicillin (Amoxicillin) 2)  Vytorin (Ezetimibe-Simvastatin) 3)  Bactrim Ds (Sulfamethoxazole-Trimethoprim) Laboratory Results   Blood Tests     PT: 18.6 s   (Normal Range: 10.6-13.4)  INR: 2.2   (Normal Range: 0.88-1.12   Therap INR: 2.0-3.5) Comments: Rita Ohara  September 07, 2009 1:12 PM     Orders Added: 1)  Est. Patient Level I [99211] 2)  Protime [60454UJ]   ANTICOAGULATION RECORD PREVIOUS REGIMEN & LAB RESULTS Anticoagulation Diagnosis:  v58.83,v58.61,427.31 on  07/18/2008 Previous INR Goal Range:  2.0-3.0 on  07/18/2008 Previous INR:  2.6 on  08/10/2009 Previous Coumadin Dose(mg):  2mg  QD on  02/05/2009 Previous Regimen:  same on  08/10/2009 Previous Coagulation Comments:  Hold for 7 days then 2mg  qd on  09/22/2008  NEW REGIMEN & LAB RESULTS Current INR: 2.2 Regimen: same  Repeat testing in: 1 month  Anticoagulation Visit Questionnaire Coumadin dose missed/changed:  No Abnormal Bleeding Symptoms:  No  Any diet changes including alcohol intake, vegetables or greens since the last visit:  No Any illnesses or hospitalizations since the last visit:  No Any signs of clotting since the last visit (including chest discomfort, dizziness, shortness of breath, arm tingling, slurred speech, swelling or redness in leg):  No  MEDICATIONS AMIODARONE HCL 200 MG TABS (AMIODARONE HCL) once daily ASPIR-81 81 MG TBEC (ASPIRIN) Take 1 tablet by mouth once a day CALCIUM 500+D HIGH POTENCY 500-400 MG-UNIT TABS (CALCIUM CARBONATE-VITAMIN D) 1 two times a day COUMADIN 2 MG TABS (WARFARIN SODIUM) 2mg  once daily LASIX 40 MG TABS (FUROSEMIDE) 1 tablet by mouth once a day POTASSIUM CHLORIDE CRYS CR 20 MEQ TBCR (POTASSIUM CHLORIDE CRYS CR) Take 1 tablet once a day LEVOTHYROXINE SODIUM 100 MCG TABS (LEVOTHYROXINE SODIUM) one by mouth daily TOPROL XL 50 MG TB24 (METOPROLOL SUCCINATE) two times a day CRESTOR 10 MG TABS  (ROSUVASTATIN CALCIUM) one by mouth daily RAMIPRIL 10 MG CAPS (RAMIPRIL) 1 once daily TRAMADOL HCL 50 MG TABS (TRAMADOL HCL) one by mouth every 6 hours as needed pain ( may use for your intermitant back pain)

## 2010-09-26 NOTE — Assessment & Plan Note (Signed)
Summary: eph  Medications Added METOPROLOL SUCCINATE 50 MG XR24H-TAB (METOPROLOL SUCCINATE) 3 tabs by mouth at bedtime        Primary Provider:  Stacie Glaze MD  CC:  follow up.  History of Present Illness: This is a 77 year old white male patient who recently underwent successful insertion of a new [employer] ICD as well as a new left ventricular pacing lead for ICD device  ERI. He has had a lot of trouble with maintaining sinus rhythm and we have settled now for only rate control.  He is actually feeling quite well and able to work in his yard some without recurrent palpitations, dizziness, or presyncope. He does get out of breath and has to sit down or rests for a little while but this is chronic secondary to his nonischemic cardiomyopathy.He overall can do more than he had been doing.  Current Medications (verified): 1)  Aspir-81 81 Mg Tbec (Aspirin) .... Take 1 Tablet By Mouth Once A Day 2)  Calcium 600-D 600-400 Mg-Unit Tabs (Calcium Carbonate-Vitamin D) .... Two Times A Day 3)  Coumadin 2 Mg Tabs (Warfarin Sodium) .... 2mg  Once Daily 4)  Lasix 40 Mg Tabs (Furosemide) .Marland Kitchen.. 1 Tablet By Mouth Once A Day 5)  Potassium Chloride Crys Cr 20 Meq Tbcr (Potassium Chloride Crys Cr) .... Take 1 Tablet Once A Day 6)  Levothyroxine Sodium 100 Mcg Tabs (Levothyroxine Sodium) .... One By Mouth Daily 7)  Crestor 10 Mg Tabs (Rosuvastatin Calcium) .... One By Mouth Daily 8)  Ramipril 10 Mg Caps (Ramipril) .Marland Kitchen.. 1 Once Daily 9)  Metoprolol Succinate 50 Mg Xr24h-Tab (Metoprolol Succinate) .... 3 Tabs By Mouth At Bedtime  Allergies: 1)  ! Penicillin 2)  Amoxicillin (Amoxicillin) 3)  Bactrim Ds (Sulfamethoxazole-Trimethoprim) 4)  Vytorin (Ezetimibe-Simvastatin)  Past History:  Past Medical History: Last updated: 02/12/2007 nonischemic cardiomypathy tachycardia-induced cardiomypathy left bundle branch block Congestive heart failure Atrial fibrillation/s/p ablation Anticoagulation  therapy Hypertension Hypothyroidism AVR defibrillator Hyperlipidemia  Past Surgical History: Last updated: 04/13/2007 Aortic Valve Replacement:  Coronary artery bypass graft pacer/efibrillator 2006  Review of Systems       see history of present illness  Vital Signs:  Patient profile:   77 year old male Height:      65 inches Weight:      172 pounds BMI:     28.73 Pulse rate:   84 / minute Resp:     14 per minute BP sitting:   124 / 76  (left arm)  Vitals Entered By: Kem Parkinson (May 15, 2010 12:12 PM)  Physical Exam  General:   Well-nournished, in no acute distress. Neck: No JVD, HJR, Bruit, or thyroid enlargement Lungs: No tachypnea, clear without wheezing, rales, or rhonchi Cardiovascular:incision and left upper chest healing well at ICD site, irregular irregular, PMI not displaced, 2/6 systolic murmur at the left sternal border, no gallops, bruit, thrill, or heave. Abdomen: BS normal. Soft without organomegaly, masses, lesions or tenderness. Extremities: without cyanosis, clubbing or edema. Good distal pulses bilateral SKin: Warm, no lesions or rashes  Musculoskeletal: No deformities Neuro: no focal signs    EKG  Procedure date:  05/15/2010  Findings:      paced rhythm with underlying atrial fibrillation left bundle branch block   ICD Specifications Following MD:  Lewayne Bunting, MD     ICD Vendor:  [facility] [employer]     ICD Model Number:  N119     ICD Serial Number:  [id number] ICD DOI:  04/26/2010  ICD Implanting MD:  Lewayne Bunting, MD  Lead 1:    Location: RA     DOI: 03/11/2005     Model #: [id number]     Serial #: [id number]     Status: active Lead 2:    Location: RV     DOI: 03/11/2005     Model #: [id number]     Serial #: [id number]     Status: active Lead 3:    Location: LV     DOI: 03/11/2005     Model #: 4543     Serial #: [id number]     Status: capped Lead 4:    Location: LV     DOI: 04/26/2010     Model #: [id number]     Serial #: [id number]     Status:  active  Indications::  NICM, CHF  Explantation Comments: 04/26/10 [employer] [id number] explanted  ICD Follow Up ICD Dependent:  No      Episodes Coumadin:  Yes  Brady Parameters Mode DDIR     Lower Rate Limit:  70     Upper Rate Limit 120 PAV 120     Sensed AV Delay:  120  Tachy Zones VF:  240     VT:  200     VT1:  170     Impression & Recommendations:  Problem # 1:  AUTOMATIC IMPLANTABLE CARDIAC DEFIBRILLATOR SITU (ICD-V45.02) Patient is post hospital for ICD replacement as well as successful insertionof a new LV pacing lead. Incisions look good and are healing well.  Problem # 2:  CHRONIC SYSTOLIC HEART FAILURE (ICD-428.22) Patient's heart failure is compensated His updated medication list for this problem includes:    Aspir-81 81 Mg Tbec (Aspirin) .Marland Kitchen... Take 1 tablet by mouth once a day    Coumadin 2 Mg Tabs (Warfarin sodium) ..... 2mg  once daily    Lasix 40 Mg Tabs (Furosemide) .Marland Kitchen... 1 tablet by mouth once a day    Ramipril 10 Mg Caps (Ramipril) .Marland Kitchen... 1 once daily    Metoprolol Succinate 50 Mg Xr24h-tab (Metoprolol succinate) .Marland KitchenMarland KitchenMarland KitchenMarland Kitchen 3 tabs by mouth at bedtime  Problem # 3:  ATRIAL FIBRILLATION (ICD-427.31) Patient's rate is much better controlled and overall he is doing well. His updated medication list for this problem includes:    Aspir-81 81 Mg Tbec (Aspirin) .Marland Kitchen... Take 1 tablet by mouth once a day    Coumadin 2 Mg Tabs (Warfarin sodium) ..... 2mg  once daily    Metoprolol Succinate 50 Mg Xr24h-tab (Metoprolol succinate) .Marland KitchenMarland KitchenMarland KitchenMarland Kitchen 3 tabs by mouth at bedtime  Other Orders: EKG w/ Interpretation (93000)  Patient Instructions: 1)  Your physician recommends that you schedule a follow-up appointment in: 2 MONTHS WITH DR Ladona Ridgel 2)  Your physician recommends that you continue on your current medications as directed. Please refer to the Current Medication list given to you today.

## 2010-09-26 NOTE — Assessment & Plan Note (Signed)
Summary: 1300/change lrl to 70bpm      Allergies Added:   Current Medications (verified): 1)  Aspir-81 81 Mg Tbec (Aspirin) .... Take 1 Tablet By Mouth Once A Day 2)  Calcium 600-D 600-400 Mg-Unit Tabs (Calcium Carbonate-Vitamin D) .... Two Times A Day 3)  Coumadin 2 Mg Tabs (Warfarin Sodium) .... 2mg  Once Daily 4)  Lasix 40 Mg Tabs (Furosemide) .Marland Kitchen.. 1 Tablet By Mouth Once A Day 5)  Potassium Chloride Crys Cr 20 Meq Tbcr (Potassium Chloride Crys Cr) .... Take 1 Tablet Once A Day 6)  Levothyroxine Sodium 100 Mcg Tabs (Levothyroxine Sodium) .... One By Mouth Daily 7)  Crestor 10 Mg Tabs (Rosuvastatin Calcium) .... One By Mouth Daily 8)  Ramipril 10 Mg Caps (Ramipril) .Marland Kitchen.. 1 Once Daily 9)  Metoprolol Succinate 50 Mg Xr24h-Tab (Metoprolol Succinate) .... 3 Tabs By Mouth At Bedtime  Allergies (verified): 1)  ! Penicillin 2)  Amoxicillin (Amoxicillin) 3)  Bactrim Ds (Sulfamethoxazole-Trimethoprim) 4)  Vytorin (Ezetimibe-Simvastatin)    ICD Specifications Following MD:  Lewayne Bunting, MD     ICD Vendor:  [facility] [employer]     ICD Model Number:  N119     ICD Serial Number:  [id number] ICD DOI:  04/26/2010     ICD Implanting MD:  Lewayne Bunting, MD  Lead 1:    Location: RA     DOI: 03/11/2005     Model #: 2440     Serial #: [id number]     Status: active Lead 2:    Location: RV     DOI: 03/11/2005     Model #: 3664     Serial #: [id number]     Status: active Lead 3:    Location: LV     DOI: 03/11/2005     Model #: 4543     Serial #: [id number]     Status: capped Lead 4:    Location: LV     DOI: 04/26/2010     Model #: 4196     Serial #: [id number]     Status: active  Indications::  NICM, CHF  Explantation Comments: 04/26/10 [employer] [id number] explanted  ICD Follow Up ICD Dependent:  No      Episodes Coumadin:  Yes  Brady Parameters Mode DDIR     Lower Rate Limit:  70     Upper Rate Limit 120 PAV 180     Sensed AV Delay:  120  Tachy Zones VF:  240     VT:  200     VT1:  170     Tech  Comments:  CHANGED LRL FROM 80 TO 70bpm AFTER ABLATION. Vella Kohler  July 29, 2010 12:09 PM

## 2010-09-26 NOTE — Cardiovascular Report (Signed)
Summary: Office Visit   Office Visit   Imported By: Roderic Ovens 07/30/2010 11:03:11  _____________________________________________________________________  External Attachment:    Type:   Image     Comment:   External Document

## 2010-09-26 NOTE — Cardiovascular Report (Signed)
Summary: Office Visit   Office Visit   Imported By: Roderic Ovens 01/07/2010 16:40:54  _____________________________________________________________________  External Attachment:    Type:   Image     Comment:   External Document

## 2010-09-26 NOTE — Cardiovascular Report (Signed)
Summary: Office Visit   Office Visit   Imported By: Roderic Ovens 08/05/2010 14:45:57  _____________________________________________________________________  External Attachment:    Type:   Image     Comment:   External Document

## 2010-09-26 NOTE — Assessment & Plan Note (Signed)
Summary: PT..LH   Nurse Visit   Allergies: 1)  Amoxicillin (Amoxicillin) 2)  Vytorin (Ezetimibe-Simvastatin) 3)  Bactrim Ds (Sulfamethoxazole-Trimethoprim) Laboratory Results   Blood Tests     PT: 17.3 s   (Normal Range: 10.6-13.4)  INR: 2.0   (Normal Range: 0.88-1.12   Therap INR: 2.0-3.5) Comments: Rita Ohara  October 05, 2009 2:29 PM     Orders Added: 1)  Est. Patient Level I [99211] 2)  Protime [78295AO]   ANTICOAGULATION RECORD PREVIOUS REGIMEN & LAB RESULTS Anticoagulation Diagnosis:  v58.83,v58.61,427.31 on  07/18/2008 Previous INR Goal Range:  2.0-3.0 on  07/18/2008 Previous INR:  2.2 on  09/07/2009 Previous Coumadin Dose(mg):  2mg  QD on  02/05/2009 Previous Regimen:  same on  09/07/2009 Previous Coagulation Comments:  Hold for 7 days then 2mg  qd on  09/22/2008  NEW REGIMEN & LAB RESULTS Current INR: 2.0 Regimen: same  Repeat testing in: 1 month

## 2010-09-26 NOTE — Assessment & Plan Note (Signed)
Summary: PT/RCD   Nurse Visit   Allergies: 1)  Amoxicillin (Amoxicillin) 2)  Vytorin (Ezetimibe-Simvastatin) 3)  Bactrim Ds (Sulfamethoxazole-Trimethoprim)  Orders Added: 1)  Est. Patient Level I [82956] 2)  Protime [21308MV]   ANTICOAGULATION RECORD PREVIOUS REGIMEN & LAB RESULTS Anticoagulation Diagnosis:  v58.83,v58.61,427.31 on  07/18/2008 Previous INR Goal Range:  2.0-3.0 on  07/18/2008 Previous INR:  1.7 on  10/31/2009 Previous Coumadin Dose(mg):  2mg  QD on  02/05/2009 Previous Regimen:  same on  10/05/2009 Previous Coagulation Comments:  Hold for 7 days then 2mg  qd on  09/22/2008  NEW REGIMEN & LAB RESULTS Regimen: hold 1 day then resume  Repeat testing in: 1 month  Anticoagulation Visit Questionnaire Coumadin dose missed/changed:  Yes Coumadin Dose Comments:  one or more missed dose(s) Abnormal Bleeding Symptoms:  No  Any diet changes including alcohol intake, vegetables or greens since the last visit:  No Any illnesses or hospitalizations since the last visit:  No Any signs of clotting since the last visit (including chest discomfort, dizziness, shortness of breath, arm tingling, slurred speech, swelling or redness in leg):  No  MEDICATIONS AMIODARONE HCL 200 MG TABS (AMIODARONE HCL) once daily ASPIR-81 81 MG TBEC (ASPIRIN) Take 1 tablet by mouth once a day CALCIUM 600-D 600-400 MG-UNIT TABS (CALCIUM CARBONATE-VITAMIN D) two times a day COUMADIN 2 MG TABS (WARFARIN SODIUM) 2mg  once daily LASIX 40 MG TABS (FUROSEMIDE) 1 tablet by mouth once a day POTASSIUM CHLORIDE CRYS CR 20 MEQ TBCR (POTASSIUM CHLORIDE CRYS CR) Take 1 tablet once a day LEVOTHYROXINE SODIUM 100 MCG TABS (LEVOTHYROXINE SODIUM) one by mouth daily CRESTOR 10 MG TABS (ROSUVASTATIN CALCIUM) one by mouth daily RAMIPRIL 10 MG CAPS (RAMIPRIL) 1 once daily

## 2010-09-26 NOTE — Assessment & Plan Note (Signed)
Summary: 1wk f/u @ 4pm per kelly/sl   Primary Provider:  Stacie Glaze MD  CC:  ROV; Device Check.  History of Present Illness: This is a 77 year old white male patient who recently underwent DC cardioversion for atrial fibrillation Jan 18, 2010 while on amiodarone. The patient has an extensive past medical history with an aortic valve replacement, atrial flutter s/p ablation, atrial fibrillation, and worsening CHF.  He is s/p BiV ICD implant and he initially had significant improvement in both his EF and his CHF symptoms.  He then had recurrent atrial fib/flutter and has required escalating doses of amiodarone.  After DCCV, he reverted back to atrial fibrillation.  He returns today for additional eval.  His big problem previously was that he had a rapid ventricular response in atrial fibrillation and had a tachycardia induced cardiomyopathy.    Problems Prior to Update: 1)  Chronic Systolic Heart Failure  (ICD-428.22) 2)  Automatic Implantable Cardiac Defibrillator Situ  (ICD-V45.02) 3)  Back Pain, Chronic, Intermittent  (ICD-724.5) 4)  Cellulitis and Abscess of Upper Arm and Forearm  (ICD-682.3) 5)  Cough  (ICD-786.2) 6)  Encounter For Therapeutic Drug Monitoring  (ICD-V58.83) 7)  Renal Insufficiency, Chronic  (ICD-585.9) 8)  Diverticulosis of Colon  (ICD-562.10) 9)  Dysmetabolic Syndrome  (ICD-277.7) 10)  Family History of Cad Male 1st Degree Relative <50  (ICD-V17.3) 11)  Osteoarthrosis, Local Nos, Lower Leg  (ICD-715.36) 12)  Hyperlipidemia  (ICD-272.4) 13)  Hypothyroidism  (ICD-244.9) 14)  Hypertension  (ICD-401.9) 15)  Anticoagulation Therapy  (ICD-V58.61) 16)  Atrial Fibrillation  (ICD-427.31) 17)  Congestive Heart Failure  (ICD-428.0)  Medications Prior to Update: 1)  Amiodarone Hcl 200 Mg Tabs (Amiodarone Hcl) .... Take Twodaily 2)  Aspir-81 81 Mg Tbec (Aspirin) .... Take 1 Tablet By Mouth Once A Day 3)  Calcium 600-D 600-400 Mg-Unit Tabs (Calcium Carbonate-Vitamin D)  .... Two Times A Day 4)  Coumadin 2 Mg Tabs (Warfarin Sodium) .... 2mg  Once Daily 5)  Lasix 40 Mg Tabs (Furosemide) .Marland Kitchen.. 1 Tablet By Mouth Once A Day 6)  Potassium Chloride Crys Cr 20 Meq Tbcr (Potassium Chloride Crys Cr) .... Take 1 Tablet Once A Day 7)  Levothyroxine Sodium 100 Mcg Tabs (Levothyroxine Sodium) .... One By Mouth Daily 8)  Crestor 10 Mg Tabs (Rosuvastatin Calcium) .... One By Mouth Daily 9)  Ramipril 10 Mg Caps (Ramipril) .Marland Kitchen.. 1 Once Daily 10)  Toprol Xl 25 Mg Xr24h-Tab (Metoprolol Succinate) .... Take 1 Tablet Daily  Current Medications (verified): 1)  Amiodarone Hcl 200 Mg Tabs (Amiodarone Hcl) .... Take Twodaily 2)  Aspir-81 81 Mg Tbec (Aspirin) .... Take 1 Tablet By Mouth Once A Day 3)  Calcium 600-D 600-400 Mg-Unit Tabs (Calcium Carbonate-Vitamin D) .... Two Times A Day 4)  Coumadin 2 Mg Tabs (Warfarin Sodium) .... 2mg  Once Daily 5)  Lasix 40 Mg Tabs (Furosemide) .Marland Kitchen.. 1 Tablet By Mouth Once A Day 6)  Potassium Chloride Crys Cr 20 Meq Tbcr (Potassium Chloride Crys Cr) .... Take 1 Tablet Once A Day 7)  Levothyroxine Sodium 100 Mcg Tabs (Levothyroxine Sodium) .... One By Mouth Daily 8)  Crestor 10 Mg Tabs (Rosuvastatin Calcium) .... One By Mouth Daily 9)  Ramipril 10 Mg Caps (Ramipril) .Marland Kitchen.. 1 Once Daily 10)  Toprol Xl 25 Mg Xr24h-Tab (Metoprolol Succinate) .... Take 1 Tablet Daily  Allergies: 1)  ! Penicillin 2)  Amoxicillin (Amoxicillin) 3)  Bactrim Ds (Sulfamethoxazole-Trimethoprim) 4)  Vytorin (Ezetimibe-Simvastatin)  Past History:  Past Medical History: Last updated: 02/12/2007  nonischemic cardiomypathy tachycardia-induced cardiomypathy left bundle branch block Congestive heart failure Atrial fibrillation/s/p ablation Anticoagulation therapy Hypertension Hypothyroidism AVR defibrillator Hyperlipidemia  Past Surgical History: Last updated: 04/13/2007 Aortic Valve Replacement:  Coronary artery bypass graft pacer/efibrillator 2006  Family  History: Last updated: 04/13/2007 father Family History of CAD Male 1st degree relative <50 Family History Lung cancer  Social History: Last updated: 04/13/2007 Retired Married Former Smoker quit for 28 years  Risk Factors: Smoking Status: quit (10/31/2009)  Review of Systems       The patient complains of dyspnea on exertion.  The patient denies chest pain, syncope, and peripheral edema.    Vital Signs:  Patient profile:   77 year old male Height:      65 inches Weight:      174 pounds Pulse rate:   80 / minute Pulse rhythm:   irregular BP sitting:   142 / 76  (right arm)  Vitals Entered By: Stanton Kidney, EMT-P (February 13, 2010 4:29 PM)  Physical Exam  General:   Well-nournished, in no acute distress. Neck: No JVD, HJR, Bruit, or thyroid enlargement Lungs: No tachypnea, clear without wheezing, rales, or rhonchi Cardiovascular: RRR, PMI not displaced,irregular irregular at 107 beats per minute, crisp valvular clicks,no bruit, thrill, or heave. Abdomen: BS normal. Soft without organomegaly, masses, lesions or tenderness. Extremities: mild bilateral ankle edema, without cyanosis, clubbing . Good distal pulses bilateral SKin: Warm, no lesions or rashes  Musculoskeletal: No deformities Neuro: no focal signs     ICD Specifications Following MD:  Lewayne Bunting, MD     ICD Vendor:  [facility] [facility] [employer] [facility] [employer]     ICD Model Number:  H170     ICD Serial Number:  [id number] ICD DOI:  03/11/2005     ICD Implanting MD:  Lewayne Bunting, MD  Lead 1:    Location: RA     DOI: 03/11/2005     Model #: 0454     Serial #: [id number]     Status: active Lead 2:    Location: RV     DOI: 03/11/2005     Model #: 1478     Serial #: [id number]     Status: active Lead 3:    Location: LV     DOI: 03/11/2005     Model #: 3086     Serial #: [id number]     Status: active  Indications::  NICM, CHF   ICD Follow Up Battery Voltage:  2.54 V     Charge Time:  11.0 seconds     Battery Est. Longevity:  32mths-24yr Underlying  rhythm:  AFLUTTER/FIB ICD Dependent:  No       ICD Device Measurements Atrium:  Amplitude: 1.6 mV, Impedance: 554 ohms,  Right Ventricle:  Amplitude: 17.5 mV, Impedance: 524 ohms, Threshold: 0.8 V at 0.5 msec Left Ventricle:  Impedance: 500 ohms, Threshold: 4.0 V at 1.2 msec Shock Impedance: 41 ohms   Episodes Percent Mode Switch:  100%     Coumadin:  Yes Shock:  0     ATP:  0     Nonsustained:  0     Atrial Therapies:  0 Ventricular Pacing:  84%  Brady Parameters Mode DDIR     Lower Rate Limit:  70     Upper Rate Limit 120 PAV 120     Sensed AV Delay:  120  Tachy Zones VF:  240     VT:  200     VT1:  170  Next Remote Date:  05/16/2010     Tech Comments:  CHANGED RV AMPLITUDE 2.4 TO 2.0 V(OK PER GT)/LV PULSE WIDTH FROM 1.5 TO 1.66ms/MODE FROM DDDR TO DDIR.  +COUMADIN.  LATITUDE 05-16-10. Vella Kohler  February 13, 2010 5:00 PM MD Comments:  Agree with above.  Impression & Recommendations:  Problem # 1:  AUTOMATIC IMPLANTABLE CARDIAC DEFIBRILLATOR SITU (ICD-V45.02) His device is working normally.  Will recheck in several months.  Problem # 2:  CHRONIC SYSTOLIC HEART FAILURE (ICD-428.22) His CHF is class 2-3.  Continue meds as below.  A low sodium diet is requested.  Will check a 2D echo. His updated medication list for this problem includes:    Amiodarone Hcl 200 Mg Tabs (Amiodarone hcl) .Marland Kitchen... Take 1 tablet daily    Aspir-81 81 Mg Tbec (Aspirin) .Marland Kitchen... Take 1 tablet by mouth once a day    Coumadin 2 Mg Tabs (Warfarin sodium) ..... 2mg  once daily    Lasix 40 Mg Tabs (Furosemide) .Marland Kitchen... 1 tablet by mouth once a day    Ramipril 10 Mg Caps (Ramipril) .Marland Kitchen... 1 once daily    Toprol Xl 25 Mg Xr24h-tab (Metoprolol succinate) .Marland Kitchen... Take 1 and 1/2 tablets daily for 2 weeks then 2 tablets daily  Problem # 3:  HYPERTENSION (ICD-401.9) His blood pressure is well controlled.  continue meds as below. His updated medication list for this problem includes:    Aspir-81 81 Mg Tbec (Aspirin) .Marland Kitchen...  Take 1 tablet by mouth once a day    Lasix 40 Mg Tabs (Furosemide) .Marland Kitchen... 1 tablet by mouth once a day    Ramipril 10 Mg Caps (Ramipril) .Marland Kitchen... 1 once daily    Toprol Xl 25 Mg Xr24h-tab (Metoprolol succinate) .Marland Kitchen... Take 1 and 1/2 tablets daily for 2 weeks then 2 tablets daily  Other Orders: Echocardiogram (Echo)  Patient Instructions: 1)  Your physician recommends that you schedule a follow-up appointment in: 6 weeks with Dr Ladona Ridgel 2)  Your physician has requested that you have an echocardiogram.  Echocardiography is a painless test that uses sound waves to create images of your heart. It provides your doctor with information about the size and shape of your heart and how well your heart's chambers and valves are working.  This procedure takes approximately one hour. There are no restrictions for this procedure. 3)  Your physician has recommended you make the following change in your medication: Decrease Amiodarone to 200mg  1 tablet daily.  Increase Metoprolol to 1 and 1/2 tablets daily for 2 weeks then 2 tablets daily. Prescriptions: TOPROL XL 25 MG XR24H-TAB (METOPROLOL SUCCINATE) Take 1 and 1/2 tablets daily for 2 weeks then 2 tablets daily  #60 x 11   Entered by:   Proofreader   Authorized by:   Laren Boom, MD, [facility]   Signed by:   Gypsy Balsam RN BSN on 02/13/2010   Method used:   Electronically to        [facility]* (retail)       [address]       [place], [place]  [id number]       Ph: [phone]       Fax: [phone]   RxID:   [id number] AMIODARONE HCL 200 MG TABS (AMIODARONE HCL) Take 1 tablet daily  #30 x 11   Entered by:   Proofreader   Authorized by:   Laren Boom, MD, [facility]   Signed by:   [employer]  RN BSN on 02/13/2010   Method used:   Electronically to        [facility]* (retail)       [address]       [place], [place]  [id number]       Ph: [phone]       Fax: [phone]   RxID:    [id number]

## 2010-09-26 NOTE — Cardiovascular Report (Signed)
Summary: Pre Op Orders   Pre Op Orders   Imported By: Roderic Ovens 04/30/2010 12:00:37  _____________________________________________________________________  External Attachment:    Type:   Image     Comment:   External Document

## 2010-09-26 NOTE — Cardiovascular Report (Signed)
Summary: Office Visit Remote   Office Visit Remote   Imported By: Roderic Ovens 01/11/2010 15:22:29  _____________________________________________________________________  External Attachment:    Type:   Image     Comment:   External Document

## 2010-09-26 NOTE — Miscellaneous (Signed)
Clinical Lists Changes  Observations: Added new observation of ECHOINTERP:  - Left ventricle: The cavity size was normal. Wall thickness was     increased in a pattern of mild LVH. Systolic function was     moderately reduced. The estimated ejection fraction was 35%.     Moderate global hypokinesis. May have some regional variation.     There is at least moderate diastolic dysfunction.   - Aortic valve: There is a Futures trader aortic valve. Mild     regurgitation (not well visualized but appear to be several small     jets likely consistent with St. Jude prosthesis). No significant     prosthetic valve stenosis. Mean gradient: 7mm Hg (S).   - Mitral valve: Mildly calcified annulus. Mildly thickened leaflets     . Mild to moderate regurgitation.   - Left atrium: The atrium was moderately dilated.   - Right ventricle: The cavity size was normal. Pacer wire or     catheter noted in right ventricle. Systolic function was mildly     reduced.   - Right atrium: The atrium was moderately dilated.   - Tricuspid valve: Moderate regurgitation.   - Pulmonary arteries: PA systolic pressure 45-49 mmHg.   - Systemic veins: IVC measured 2.1 cm with normal respirophasic     variation, suggesting RA pressure 6-10 mmHg.   Impressions:    - Normal LV size with mild LV hypertrophy. Moderately reduced LV     systolic function, EF 35%. Global hypokinesis with some regional     variation. At least moderate diastolic dysfunction. St. Jude     bioprosthetic aortic valve appears to be functioning properly.Mild     to moderate MR. Mild pulmonary hypertension. Normal RV size with     mildly reduced systolic function. (03/06/2010 10:45)      Echocardiogram  Procedure date:  03/06/2010  Findings:       - Left ventricle: The cavity size was normal. Wall thickness was     increased in a pattern of mild LVH. Systolic function was     moderately reduced. The estimated ejection fraction was 35%.  Moderate global hypokinesis. May have some regional variation.     There is at least moderate diastolic dysfunction.   - Aortic valve: There is a Futures trader aortic valve. Mild     regurgitation (not well visualized but appear to be several small     jets likely consistent with St. Jude prosthesis). No significant     prosthetic valve stenosis. Mean gradient: 7mm Hg (S).   - Mitral valve: Mildly calcified annulus. Mildly thickened leaflets     . Mild to moderate regurgitation.   - Left atrium: The atrium was moderately dilated.   - Right ventricle: The cavity size was normal. Pacer wire or     catheter noted in right ventricle. Systolic function was mildly     reduced.   - Right atrium: The atrium was moderately dilated.   - Tricuspid valve: Moderate regurgitation.   - Pulmonary arteries: PA systolic pressure 45-49 mmHg.   - Systemic veins: IVC measured 2.1 cm with normal respirophasic     variation, suggesting RA pressure 6-10 mmHg.   Impressions:    - Normal LV size with mild LV hypertrophy. Moderately reduced LV     systolic function, EF 35%. Global hypokinesis with some regional     variation. At least moderate diastolic dysfunction. St. Jude     bioprosthetic aortic valve  appears to be functioning properly.Mild     to moderate MR. Mild pulmonary hypertension. Normal RV size with     mildly reduced systolic function.

## 2010-09-26 NOTE — Assessment & Plan Note (Signed)
Summary: eph  Medications Added TOPROL XL 25 MG XR24H-TAB (METOPROLOL SUCCINATE) take 1 tablet daily      Allergies Added:   Primary Provider:  Stacie Glaze MD  CC:  eph/pt states he is doing better but he is a little short winded today. He has difficulty bending over.Marland Kitchen  History of Present Illness: This is a 77 year old white male patient who recently underwent DC cardioversion for atrial fibrillation Jan 18, 2010 while on amiodarone.  The patient states while picking up sticks and debris in his yard this morning had been getting his paper he walked up his driveway and became short of breath more than usual. He has chronic dyspnea on exertion when going up his driveway but today was worse. When he was in the office today he was found to be back in atrial fibrillation. Usually the patient can tell when he is in atrial fibrillation but did not notice his heart beating irregular today he is also going to little fast today. He denies chest pain dyspnea at rest, orthopnea, dizziness or presyncope.  Current Medications (verified): 1)  Amiodarone Hcl 200 Mg Tabs (Amiodarone Hcl) .... Take Twodaily 2)  Aspir-81 81 Mg Tbec (Aspirin) .... Take 1 Tablet By Mouth Once A Day 3)  Calcium 600-D 600-400 Mg-Unit Tabs (Calcium Carbonate-Vitamin D) .... Two Times A Day 4)  Coumadin 2 Mg Tabs (Warfarin Sodium) .... 2mg  Once Daily 5)  Lasix 40 Mg Tabs (Furosemide) .Marland Kitchen.. 1 Tablet By Mouth Once A Day 6)  Potassium Chloride Crys Cr 20 Meq Tbcr (Potassium Chloride Crys Cr) .... Take 1 Tablet Once A Day 7)  Levothyroxine Sodium 100 Mcg Tabs (Levothyroxine Sodium) .... One By Mouth Daily 8)  Crestor 10 Mg Tabs (Rosuvastatin Calcium) .... One By Mouth Daily 9)  Ramipril 10 Mg Caps (Ramipril) .Marland Kitchen.. 1 Once Daily  Allergies (verified): 1)  Amoxicillin (Amoxicillin) 2)  Vytorin (Ezetimibe-Simvastatin) 3)  Bactrim Ds (Sulfamethoxazole-Trimethoprim)  Past History:  Past Medical History: Last updated:  02/12/2007 nonischemic cardiomypathy tachycardia-induced cardiomypathy left bundle branch block Congestive heart failure Atrial fibrillation/s/p ablation Anticoagulation therapy Hypertension Hypothyroidism AVR defibrillator Hyperlipidemia  Past Surgical History: Last updated: 04/13/2007 Aortic Valve Replacement:  Coronary artery bypass graft pacer/efibrillator 2006  Family History: Last updated: 04/13/2007 father Family History of CAD Male 1st degree relative <50 Family History Lung cancer  Social History: Last updated: 04/13/2007 Retired Married Former Smoker quit for 28 years  Review of Systems       see history of present illness  Vital Signs:  Patient profile:   77 year old male Height:      69 inches Weight:      176 pounds BMI:     26.08 Pulse rate:   107 / minute Pulse rhythm:   irregular BP sitting:   160 / 86  (left arm) Cuff size:   regular  Vitals Entered By: Judithe Modest CMA (February 04, 2010 9:52 AM)  Physical Exam  General:   Well-nournished, in no acute distress. Neck: No JVD, HJR, Bruit, or thyroid enlargement Lungs: No tachypnea, clear without wheezing, rales, or rhonchi Cardiovascular: RRR, PMI not displaced,irregular irregular at 107 beats per minute, crisp valvular clicks,no bruit, thrill, or heave. Abdomen: BS normal. Soft without organomegaly, masses, lesions or tenderness. Extremities: mild bilateral ankle edema, without cyanosis, clubbing . Good distal pulses bilateral SKin: Warm, no lesions or rashes  Musculoskeletal: No deformities Neuro: no focal signs    EKG  Procedure date:  02/04/2010  Findings:  atrial fibrillation at 107 beats per minute left bundle branch block some ventricular pacing   ICD Specifications Following MD:  Lewayne Bunting, MD     ICD Vendor:  [facility] [employer]     ICD Model Number:  H170     ICD Serial Number:  [id number] ICD DOI:  03/11/2005     ICD Implanting MD:  Lewayne Bunting, MD  Lead 1:     Location: RA     DOI: 03/11/2005     Model #: [id number]     Serial #: [id number]     Status: active Lead 2:    Location: RV     DOI: 03/11/2005     Model #: [id number]     Serial #: [id number]     Status: active Lead 3:    Location: LV     DOI: 03/11/2005     Model #: [id number]     Serial #: [id number]     Status: active  Indications::  NICM, CHF   ICD Follow Up ICD Dependent:  No      Episodes Coumadin:  Yes  Brady Parameters Mode DDDR     Lower Rate Limit:  70     Upper Rate Limit 120 PAV 120     Sensed AV Delay:  120  Tachy Zones VF:  240     VT:  200     VT1:  170     Impression & Recommendations:  Problem # 1:  ATRIAL FIBRILLATION (ICD-427.31) Patient recently underwent DC cardioversion Jan 18, 2010 for atrial fibrillation. Unfortunately he is back in nature fibrillation with an uncontrolled ventricular rate. We will metoprolol 25 mg once daily. The patient is on amiodarone and complaining of the side effects of this,including bluish face. He will see Dr. Ladona Ridgel back next week to discuss further changes in medications.2-D echo was supposed to be performed next week but we will hold off since he is back in atrial fibrillation. His updated medication list for this problem includes:    Amiodarone Hcl 200 Mg Tabs (Amiodarone hcl) .Marland Kitchen... Take twodaily    Aspir-81 81 Mg Tbec (Aspirin) .Marland Kitchen... Take 1 tablet by mouth once a day    Coumadin 2 Mg Tabs (Warfarin sodium) ..... 2mg  once daily    Toprol Xl 25 Mg Xr24h-tab (Metoprolol succinate) .Marland Kitchen... Take 1 tablet daily  Problem # 2:  CHRONIC SYSTOLIC HEART FAILURE (ICD-428.22) Patient's heart failure is controlled His updated medication list for this problem includes:    Amiodarone Hcl 200 Mg Tabs (Amiodarone hcl) .Marland Kitchen... Take twodaily    Aspir-81 81 Mg Tbec (Aspirin) .Marland Kitchen... Take 1 tablet by mouth once a day    Coumadin 2 Mg Tabs (Warfarin sodium) ..... 2mg  once daily    Lasix 40 Mg Tabs (Furosemide) .Marland Kitchen... 1 tablet by mouth once a day    Ramipril 10 Mg Caps (Ramipril)  .Marland Kitchen... 1 once daily    Toprol Xl 25 Mg Xr24h-tab (Metoprolol succinate) .Marland Kitchen... Take 1 tablet daily  Orders: EKG w/ Interpretation (93000)  Problem # 3:  HYPERTENSION (ICD-401.9) Patient's blood pressure is elevated today. Hopefully this will come down with the addition of metoprolol. I've also asked him to follow sodium diet. His updated medication list for this problem includes:    Aspir-81 81 Mg Tbec (Aspirin) .Marland Kitchen... Take 1 tablet by mouth once a day    Lasix 40 Mg Tabs (Furosemide) .Marland Kitchen... 1 tablet by mouth once a day    Ramipril 10 Mg Caps (Ramipril) .Marland KitchenMarland KitchenMarland KitchenMarland Kitchen  1 once daily    Toprol Xl 25 Mg Xr24h-tab (Metoprolol succinate) .Marland Kitchen... Take 1 tablet daily   Patient Instructions: 1)  Your physician recommends that you schedule a follow-up appointment in: with Dr. Ladona Ridgel office will call. 2)  Your physician has recommended you make the following change in your medication: START TOPROL XL 25 mg one tablet a day. Prescriptions: TOPROL XL 25 MG XR24H-TAB (METOPROLOL SUCCINATE) take 1 tablet daily  #30 x 6   Entered by:   Lisabeth Devoid RN   Authorized by:   Laren Boom, MD, [facility]   Signed by:   Lisabeth Devoid RN on 02/04/2010   Method used:   Electronically to        [facility]* (retail)       [address]       [place], [place]  [id number]       Ph: [phone]       Fax: [phone]   RxID:   [id number]

## 2010-09-26 NOTE — Letter (Signed)
Summary: Patient medication list  Patient medication list   Imported By: Kassie Mends 11/01/2009 12:33:40  _____________________________________________________________________  External Attachment:    Type:   Image     Comment:   External Document

## 2010-09-26 NOTE — Assessment & Plan Note (Signed)
Summary: PT/RCD/PT RSC/CJR   Nurse Visit   Allergies: 1)  ! Penicillin 2)  Amoxicillin (Amoxicillin) 3)  Bactrim Ds (Sulfamethoxazole-Trimethoprim) 4)  Vytorin (Ezetimibe-Simvastatin) Laboratory Results   Blood Tests   Date/Time Received: July 01, 2010 2:57 PM  Date/Time Reported: July 01, 2010 2:57 PM    INR: 2.4   (Normal Range: 0.88-1.12   Therap INR: 2.0-3.5) Comments: Wynona Canes, CMA  July 01, 2010 2:57 PM     Orders Added: 1)  Est. Patient Level I [99211] 2)  Protime [16109UE]  Laboratory Results   Blood Tests      INR: 2.4   (Normal Range: 0.88-1.12   Therap INR: 2.0-3.5) Comments: Wynona Canes, CMA  July 01, 2010 2:57 PM       ANTICOAGULATION RECORD PREVIOUS REGIMEN & LAB RESULTS Anticoagulation Diagnosis:  v58.83,v58.61,427.31 on  07/18/2008 Previous INR Goal Range:  2.0-3.0 on  07/18/2008 Previous INR:  2.3 on  05/29/2010 Previous Coumadin Dose(mg):  2mg  QD on  02/05/2009 Previous Regimen:  same on  05/02/2010 Previous Coagulation Comments:  Hold for 7 days then 2mg  qd on  09/22/2008  NEW REGIMEN & LAB RESULTS Current INR: 2.4 Regimen: same  (no change)       Repeat testing in: 4 weeks MEDICATIONS ASPIR-81 81 MG TBEC (ASPIRIN) Take 1 tablet by mouth once a day CALCIUM 600-D 600-400 MG-UNIT TABS (CALCIUM CARBONATE-VITAMIN D) two times a day COUMADIN 2 MG TABS (WARFARIN SODIUM) 2mg  once daily LASIX 40 MG TABS (FUROSEMIDE) 1 tablet by mouth once a day POTASSIUM CHLORIDE CRYS CR 20 MEQ TBCR (POTASSIUM CHLORIDE CRYS CR) Take 1 tablet once a day LEVOTHYROXINE SODIUM 100 MCG TABS (LEVOTHYROXINE SODIUM) one by mouth daily CRESTOR 10 MG TABS (ROSUVASTATIN CALCIUM) one by mouth daily RAMIPRIL 10 MG CAPS (RAMIPRIL) 1 once daily METOPROLOL SUCCINATE 50 MG XR24H-TAB (METOPROLOL SUCCINATE) 3 tabs by mouth at bedtime   Anticoagulation Visit Questionnaire      Coumadin dose missed/changed:  No      Abnormal Bleeding Symptoms:   No   Any diet changes including alcohol intake, vegetables or greens since the last visit:  No Any illnesses or hospitalizations since the last visit:  No Any signs of clotting since the last visit (including chest discomfort, dizziness, shortness of breath, arm tingling, slurred speech, swelling or redness in leg):  No

## 2010-09-26 NOTE — Assessment & Plan Note (Signed)
Summary: pc2  Medications Added METOPROLOL TARTRATE 50 MG TABS (METOPROLOL TARTRATE) Take one tablet by mouth twice a day        Visit Type:  Follow-up Primary Provider:  Stacie Glaze MD   History of Present Illness: This is a 77 year old white male patient who recently underwent DC cardioversion for atrial fibrillation Jan 18, 2010 while on amiodarone. The patient has an extensive past medical history with an aortic valve replacement, atrial flutter s/p ablation, atrial fibrillation, and worsening CHF.  He is s/p BiV ICD implant and he initially had significant improvement in both his EF and his CHF symptoms.  He then had recurrent atrial fib/flutter and has required escalating doses of amiodarone.  After DCCV, he reverted back to atrial fibrillation.  His big problem previously was that he had a rapid ventricular response in atrial fibrillation and had a tachycardia induced cardiomyopathy.  When I last saw him, I asked him to start back on his beta blocker and we decreased his amiodarone.  He has reached ERI on his device.  Overall he has felt fairly well.  No other complaints.  He works in his yard but now notes that he is unable to do the activities that he had been able to do previously.    Current Medications (verified): 1)  Amiodarone Hcl 200 Mg Tabs (Amiodarone Hcl) .... Take 1 Tablet Daily 2)  Aspir-81 81 Mg Tbec (Aspirin) .... Take 1 Tablet By Mouth Once A Day 3)  Calcium 600-D 600-400 Mg-Unit Tabs (Calcium Carbonate-Vitamin D) .... Two Times A Day 4)  Coumadin 2 Mg Tabs (Warfarin Sodium) .... 2mg  Once Daily 5)  Lasix 40 Mg Tabs (Furosemide) .Marland Kitchen.. 1 Tablet By Mouth Once A Day 6)  Potassium Chloride Crys Cr 20 Meq Tbcr (Potassium Chloride Crys Cr) .... Take 1 Tablet Once A Day 7)  Levothyroxine Sodium 100 Mcg Tabs (Levothyroxine Sodium) .... One By Mouth Daily 8)  Crestor 10 Mg Tabs (Rosuvastatin Calcium) .... One By Mouth Daily 9)  Ramipril 10 Mg Caps (Ramipril) .Marland Kitchen.. 1 Once  Daily 10)  Toprol Xl 50 Mg Xr24h-Tab (Metoprolol Succinate) .... One By Mouth Daily  Note The New Strength  Allergies: 1)  ! Penicillin 2)  Amoxicillin (Amoxicillin) 3)  Bactrim Ds (Sulfamethoxazole-Trimethoprim) 4)  Vytorin (Ezetimibe-Simvastatin)  Past History:  Past Medical History: Last updated: 02/12/2007 nonischemic cardiomypathy tachycardia-induced cardiomypathy left bundle branch block Congestive heart failure Atrial fibrillation/s/p ablation Anticoagulation therapy Hypertension Hypothyroidism AVR defibrillator Hyperlipidemia  Past Surgical History: Last updated: 04/13/2007 Aortic Valve Replacement:  Coronary artery bypass graft pacer/efibrillator 2006  Review of Systems       The patient complains of dyspnea on exertion.  The patient denies chest pain, syncope, and peripheral edema.    Vital Signs:  Patient profile:   77 year old male Height:      65 inches Weight:      174 pounds BMI:     29.06 Pulse rate:   87 / minute BP sitting:   116 / 70  (right arm)  Vitals Entered By: Laurance Flatten CMA (March 26, 2010 4:10 PM)  Physical Exam  General:  alert, well-developed, and dusky.   Head:  no abnormalities observed and no abnormalities palpated.   Eyes:  pupils equal and pupils round.   Mouth:  Oral mucosa and oropharynx without lesions or exudates.  Teeth in good repair. Neck:  No deformities, masses, or tenderness noted. Chest Wall:  Well healed ICD incision. Lungs:  normal respiratory  effort and no wheezes.   Heart:  normal rate and regular rhythm.   Abdomen:  soft and non-tender.   Msk:  no joint swelling and no joint warmth.   Pulses:  R and L carotid,radial,femoral,dorsalis pedis and posterior tibial pulses are full and equal bilaterally Extremities:  trace left pedal edema and trace right pedal edema.   Neurologic:  alert & oriented X3 and finger-to-nose normal.      ICD Specifications Following MD:  Lewayne Bunting, MD     ICD Vendor:  [facility]  [employer]     ICD Model Number:  H170     ICD Serial Number:  [id number] ICD DOI:  03/11/2005     ICD Implanting MD:  Lewayne Bunting, MD  Lead 1:    Location: RA     DOI: 03/11/2005     Model #: [id number]     Serial #: [id number]     Status: active Lead 2:    Location: RV     DOI: 03/11/2005     Model #: [id number]     Serial #: [id number]     Status: active Lead 3:    Location: LV     DOI: 03/11/2005     Model #: [id number]     Serial #: [id number]     Status: active  Indications::  NICM, CHF   ICD Follow Up Battery Voltage:  ERI V     Charge Time:  12.8 seconds     Underlying rhythm:  AF ICD Dependent:  No       ICD Device Measurements Atrium:  Amplitude: 0.8 mV, Impedance: 589 ohms,  Right Ventricle:  Amplitude: 17.6 mV, Impedance: 536 ohms, Threshold: 0.6 V at 0.40 msec Left Ventricle:  Impedance: 524 ohms, Threshold: 3.5 V at 1.2 msec Shock Impedance: 42 ohms   Episodes Coumadin:  Yes Shock:  0     ATP:  0     Nonsustained:  0     Atrial Therapies:  0 Atrial Pacing:  0%     Ventricular Pacing:  48%  Brady Parameters Mode DDIR     Lower Rate Limit:  70     Upper Rate Limit 120 PAV 120     Sensed AV Delay:  120  Tachy Zones VF:  240     VT:  200     VT1:  170     Tech Comments:  BATTERY REACHED ERI ON 03-23-10.  NO CHANGES MADE.  Vella Kohler  March 26, 2010 4:16 PM MD Comments:  Agree with above.  Impression & Recommendations:  Problem # 1:  AUTOMATIC IMPLANTABLE CARDIAC DEFIBRILLATOR SITU (ICD-V45.02) His device has reached ERI.  I have recommended ICD generator change.  His LV lead threshold is up a bit.  Will consider adding a new LV lead at the time of his implant.  Problem # 2:  ATRIAL FIBRILLATION (ICD-427.31) At this point we will abandon rhythm control and strive for rate control.  I have asked him to stop his amiodarone and increase his metoprolol to twice daily. The following medications were removed from the medication list:    Amiodarone Hcl 200 Mg Tabs (Amiodarone hcl) .Marland Kitchen... Take 1 tablet  daily His updated medication list for this problem includes:    Aspir-81 81 Mg Tbec (Aspirin) .Marland Kitchen... Take 1 tablet by mouth once a day    Coumadin 2 Mg Tabs (Warfarin sodium) ..... 2mg  once daily    Metoprolol Tartrate 50 Mg Tabs (Metoprolol tartrate) .Marland KitchenMarland KitchenMarland KitchenMarland Kitchen  Take one tablet by mouth twice a day  Problem # 3:  CHRONIC SYSTOLIC HEART FAILURE (ICD-428.22) His CHF symptoms are worse but not severe.  I have asked him to continue his meds as below, maintain a low sodium diet and will attempt better rate control before proceeding with AV node ablation. The following medications were removed from the medication list:    Amiodarone Hcl 200 Mg Tabs (Amiodarone hcl) .Marland Kitchen... Take 1 tablet daily His updated medication list for this problem includes:    Aspir-81 81 Mg Tbec (Aspirin) .Marland Kitchen... Take 1 tablet by mouth once a day    Coumadin 2 Mg Tabs (Warfarin sodium) ..... 2mg  once daily    Lasix 40 Mg Tabs (Furosemide) .Marland Kitchen... 1 tablet by mouth once a day    Ramipril 10 Mg Caps (Ramipril) .Marland Kitchen... 1 once daily    Metoprolol Tartrate 50 Mg Tabs (Metoprolol tartrate) .Marland Kitchen... Take one tablet by mouth twice a day  Patient Instructions: 1)  Will sch procedure on 04/26/10.  Will need to be at the Hospital at 5:30am for a 7:30 am procedure.  Hold Coumadin the night prior 2)  Your physician has recommended you make the following change in your medication: Increase your Toprol to 1 1/2 tablets daily for two weeks then one two times a day  Stop Amiodarone Prescriptions: METOPROLOL TARTRATE 50 MG TABS (METOPROLOL TARTRATE) Take one tablet by mouth twice a day  #60 x 11   Entered by:   Dennis Bast, RN, BSN   Authorized by:   Laren Boom, MD, [facility]   Signed by:   Dennis Bast, RN, BSN on 03/26/2010   Method used:   Electronically to        [facility]* (retail)       [address]       [place], [place]  [id number]       Ph: [phone]       Fax: [phone]   RxID:   [id number]

## 2010-09-26 NOTE — Assessment & Plan Note (Signed)
Summary: ROA/FUP/RCD/PT RSC/CJR   Vital Signs:  Patient profile:   77 year old male Height:      65 inches Weight:      170 pounds BMI:     28.39 Temp:     98.2 degrees F oral Pulse rate:   84 / minute Pulse rhythm:   irregular Resp:     14 per minute BP sitting:   138 / 80  (left arm)  Vitals Entered By: Willy Eddy, LPN (February 27, 3085 12:10 PM) CC: med change by cardiologist-haveing echo next week --a fib flare ups, Hypertension Management   Primary Care Provider:  Stacie Glaze MD  CC:  med change by cardiologist-haveing echo next week --a fib flare ups and Hypertension Management.  History of Present Illness: follow up of reduction in amiodarone and monitering of HTN and arrythmia no apparent CHF by exam or complaints back pain stable/controled monitering protimes  Hypertension History:      He denies headache, chest pain, palpitations, dyspnea with exertion, orthopnea, PND, peripheral edema, visual symptoms, neurologic problems, syncope, and side effects from treatment.        Positive major cardiovascular risk factors include male age 79 years old or older, diabetes, hyperlipidemia, and hypertension.  Negative major cardiovascular risk factors include non-tobacco-user status.        Positive history for target organ damage include cardiac end organ damage (either CHF or LVH) and peripheral vascular disease.  Further assessment for target organ damage reveals no history of ASHD or stroke/TIA.     Preventive Screening-Counseling & Management  Alcohol-Tobacco     Smoking Status: quit     Year Started: smoked for 28 years  Problems Prior to Update: 1)  Chronic Systolic Heart Failure  (ICD-428.22) 2)  Automatic Implantable Cardiac Defibrillator Situ  (ICD-V45.02) 3)  Back Pain, Chronic, Intermittent  (ICD-724.5) 4)  Cellulitis and Abscess of Upper Arm and Forearm  (ICD-682.3) 5)  Cough  (ICD-786.2) 6)  Encounter For Therapeutic Drug Monitoring  (ICD-V58.83) 7)   Renal Insufficiency, Chronic  (ICD-585.9) 8)  Diverticulosis of Colon  (ICD-562.10) 9)  Dysmetabolic Syndrome  (ICD-277.7) 10)  Family History of Cad Male 1st Degree Relative <50  (ICD-V17.3) 11)  Osteoarthrosis, Local Nos, Lower Leg  (ICD-715.36) 12)  Hyperlipidemia  (ICD-272.4) 13)  Hypothyroidism  (ICD-244.9) 14)  Hypertension  (ICD-401.9) 15)  Anticoagulation Therapy  (ICD-V58.61) 16)  Atrial Fibrillation  (ICD-427.31) 17)  Congestive Heart Failure  (ICD-428.0)  Current Problems (verified): 1)  Chronic Systolic Heart Failure  (ICD-428.22) 2)  Automatic Implantable Cardiac Defibrillator Situ  (ICD-V45.02) 3)  Back Pain, Chronic, Intermittent  (ICD-724.5) 4)  Cellulitis and Abscess of Upper Arm and Forearm  (ICD-682.3) 5)  Cough  (ICD-786.2) 6)  Encounter For Therapeutic Drug Monitoring  (ICD-V58.83) 7)  Renal Insufficiency, Chronic  (ICD-585.9) 8)  Diverticulosis of Colon  (ICD-562.10) 9)  Dysmetabolic Syndrome  (ICD-277.7) 10)  Family History of Cad Male 1st Degree Relative <50  (ICD-V17.3) 11)  Osteoarthrosis, Local Nos, Lower Leg  (ICD-715.36) 12)  Hyperlipidemia  (ICD-272.4) 13)  Hypothyroidism  (ICD-244.9) 14)  Hypertension  (ICD-401.9) 15)  Anticoagulation Therapy  (ICD-V58.61) 16)  Atrial Fibrillation  (ICD-427.31) 17)  Congestive Heart Failure  (ICD-428.0)  Medications Prior to Update: 1)  Amiodarone Hcl 200 Mg Tabs (Amiodarone Hcl) .... Take 1 Tablet Daily 2)  Aspir-81 81 Mg Tbec (Aspirin) .... Take 1 Tablet By Mouth Once A Day 3)  Calcium 600-D 600-400 Mg-Unit Tabs (Calcium Carbonate-Vitamin  D) .... Two Times A Day 4)  Coumadin 2 Mg Tabs (Warfarin Sodium) .... 2mg  Once Daily 5)  Lasix 40 Mg Tabs (Furosemide) .Marland Kitchen.. 1 Tablet By Mouth Once A Day 6)  Potassium Chloride Crys Cr 20 Meq Tbcr (Potassium Chloride Crys Cr) .... Take 1 Tablet Once A Day 7)  Levothyroxine Sodium 100 Mcg Tabs (Levothyroxine Sodium) .... One By Mouth Daily 8)  Crestor 10 Mg Tabs (Rosuvastatin  Calcium) .... One By Mouth Daily 9)  Ramipril 10 Mg Caps (Ramipril) .Marland Kitchen.. 1 Once Daily 10)  Toprol Xl 25 Mg Xr24h-Tab (Metoprolol Succinate) .... Take 1 and 1/2 Tablets Daily For 2 Weeks Then 2 Tablets Daily  Current Medications (verified): 1)  Amiodarone Hcl 200 Mg Tabs (Amiodarone Hcl) .... Take 1 Tablet Daily 2)  Aspir-81 81 Mg Tbec (Aspirin) .... Take 1 Tablet By Mouth Once A Day 3)  Calcium 600-D 600-400 Mg-Unit Tabs (Calcium Carbonate-Vitamin D) .... Two Times A Day 4)  Coumadin 2 Mg Tabs (Warfarin Sodium) .... 2mg  Once Daily 5)  Lasix 40 Mg Tabs (Furosemide) .Marland Kitchen.. 1 Tablet By Mouth Once A Day 6)  Potassium Chloride Crys Cr 20 Meq Tbcr (Potassium Chloride Crys Cr) .... Take 1 Tablet Once A Day 7)  Levothyroxine Sodium 100 Mcg Tabs (Levothyroxine Sodium) .... One By Mouth Daily 8)  Crestor 10 Mg Tabs (Rosuvastatin Calcium) .... One By Mouth Daily 9)  Ramipril 10 Mg Caps (Ramipril) .Marland Kitchen.. 1 Once Daily 10)  Toprol Xl 50 Mg Xr24h-Tab (Metoprolol Succinate) .... One By Mouth Daily  Note The New Strength  Allergies (verified): 1)  ! Penicillin 2)  Amoxicillin (Amoxicillin) 3)  Bactrim Ds (Sulfamethoxazole-Trimethoprim) 4)  Vytorin (Ezetimibe-Simvastatin)  Past History:  Family History: Last updated: 04/13/2007 father Family History of CAD Male 1st degree relative <50 Family History Lung cancer  Social History: Last updated: 04/13/2007 Retired Married Former Smoker quit for 28 years  Risk Factors: Smoking Status: quit (02/27/2010)  Past medical, surgical, family and social histories (including risk factors) reviewed, and no changes noted (except as noted below).  Past Medical History: Reviewed history from 02/12/2007 and no changes required. nonischemic cardiomypathy tachycardia-induced cardiomypathy left bundle branch block Congestive heart failure Atrial fibrillation/s/p ablation Anticoagulation  therapy Hypertension Hypothyroidism AVR defibrillator Hyperlipidemia  Past Surgical History: Reviewed history from 04/13/2007 and no changes required. Aortic Valve Replacement:  Coronary artery bypass graft pacer/efibrillator 2006  Family History: Reviewed history from 04/13/2007 and no changes required. father Family History of CAD Male 1st degree relative <50 Family History Lung cancer  Social History: Reviewed history from 04/13/2007 and no changes required. Retired Married Former Smoker quit for 28 years  Review of Systems       The patient complains of decreased hearing and dyspnea on exertion.  The patient denies anorexia, fever, weight loss, weight gain, vision loss, hoarseness, chest pain, syncope, peripheral edema, prolonged cough, headaches, hemoptysis, abdominal pain, melena, hematochezia, severe indigestion/heartburn, hematuria, incontinence, genital sores, muscle weakness, suspicious skin lesions, transient blindness, difficulty walking, depression, unusual weight change, abnormal bleeding, enlarged lymph nodes, angioedema, breast masses, and testicular masses.    Physical Exam  General:  alert, well-developed, and dusky.   Head:  no abnormalities observed and no abnormalities palpated.   Eyes:  pupils equal and pupils round.   Ears:  R ear normal and L ear normal.   Nose:  no external deformity and no nasal discharge.   Neck:  No deformities, masses, or tenderness noted. Lungs:  normal respiratory effort and no wheezes.  Heart:  normal rate and regular rhythm.   Abdomen:  soft and non-tender.   Msk:  no joint swelling and no joint warmth.   Extremities:  trace left pedal edema and trace right pedal edema.   Neurologic:  alert & oriented X3 and finger-to-nose normal.     Impression & Recommendations:  Problem # 1:  RENAL INSUFFICIENCY, CHRONIC (ICD-585.9) monitering creatinine  Problem # 2:  HYPERTENSION (ICD-401.9)  His updated medication list for  this problem includes:    Lasix 40 Mg Tabs (Furosemide) .Marland Kitchen... 1 tablet by mouth once a day    Ramipril 10 Mg Caps (Ramipril) .Marland Kitchen... 1 once daily    Toprol Xl 50 Mg Xr24h-tab (Metoprolol succinate) ..... One by mouth daily  note the new strength  BP today: 138/80 Prior BP: 142/76 (02/13/2010)  Prior 10 Yr Risk Heart Disease: 27 % (10/31/2009)  Labs Reviewed: K+: 4.1 (01/14/2010) Creat: : 1.4 (01/14/2010)   Chol: 137 (10/31/2009)   HDL: 27.40 (10/31/2009)   LDL: 84 (02/28/2009)   TG: 119.0 (02/28/2009)  Problem # 3:  HYPERLIPIDEMIA (ICD-272.4)  His updated medication list for this problem includes:    Crestor 10 Mg Tabs (Rosuvastatin calcium) ..... One by mouth daily  Labs Reviewed: SGOT: 36 (05/09/2009)   SGPT: 27 (05/09/2009)  Lipid Goals: Chol Goal: 200 (07/15/2007)   HDL Goal: 40 (07/15/2007)   LDL Goal: 100 (07/15/2007)   TG Goal: 150 (07/15/2007)  Prior 10 Yr Risk Heart Disease: 27 % (10/31/2009)   HDL:27.40 (10/31/2009), 33 (08/10/2009)  LDL:84 (02/28/2009), DEL (16/05/9603)  Chol:137 (10/31/2009), 158 (08/10/2009)  Trig:119.0 (02/28/2009), 216 (10/09/2008)  Complete Medication List: 1)  Amiodarone Hcl 200 Mg Tabs (Amiodarone hcl) .... Take 1 tablet daily 2)  Aspir-81 81 Mg Tbec (Aspirin) .... Take 1 tablet by mouth once a day 3)  Calcium 600-d 600-400 Mg-unit Tabs (Calcium carbonate-vitamin d) .... Two times a day 4)  Coumadin 2 Mg Tabs (Warfarin sodium) .... 2mg  once daily 5)  Lasix 40 Mg Tabs (Furosemide) .Marland Kitchen.. 1 tablet by mouth once a day 6)  Potassium Chloride Crys Cr 20 Meq Tbcr (Potassium chloride crys cr) .... Take 1 tablet once a day 7)  Levothyroxine Sodium 100 Mcg Tabs (Levothyroxine sodium) .... One by mouth daily 8)  Crestor 10 Mg Tabs (Rosuvastatin calcium) .... One by mouth daily 9)  Ramipril 10 Mg Caps (Ramipril) .Marland Kitchen.. 1 once daily 10)  Toprol Xl 50 Mg Xr24h-tab (Metoprolol succinate) .... One by mouth daily  note the new strength  Other Orders: Protime  (54098JX) Fingerstick (91478)  Hypertension Assessment/Plan:      The patient's hypertensive risk group is category C: Target organ damage and/or diabetes.  His calculated 10 year risk of coronary heart disease is 27 %.  Today's blood pressure is 138/80.  His blood pressure goal is < 130/80.  Patient Instructions: 1)  Please schedule a follow-up appointment in 3 months. Prescriptions: TOPROL XL 50 MG XR24H-TAB (METOPROLOL SUCCINATE) one by mouth daily  note the new strength  #30 x 11   Entered and Authorized by:   Stacie Glaze MD   Signed by:   Stacie Glaze MD on 02/27/2010   Method used:   Electronically to        [facility]* (retail)       [address]       [place], [place]  [id number]       Ph: [phone]       Fax: [phone]  RxID:   [id number]    ANTICOAGULATION RECORD PREVIOUS REGIMEN & LAB RESULTS Anticoagulation Diagnosis:  v58.83,v58.61,427.31 on  07/18/2008 Previous INR Goal Range:  2.0-3.0 on  07/18/2008 Previous INR:  2.3 on  01/28/2010 Previous Coumadin Dose(mg):  2mg  QD on  02/05/2009 Previous Regimen:  same on  01/28/2010 Previous Coagulation Comments:  Hold for 7 days then 2mg  qd on  09/22/2008  NEW REGIMEN & LAB RESULTS Current INR: 2.2 Regimen: same  Repeat testing in: 4 weeks  Anticoagulation Visit Questionnaire Coumadin dose missed/changed:  No Abnormal Bleeding Symptoms:  No  Any diet changes including alcohol intake, vegetables or greens since the last visit:  No Any illnesses or hospitalizations since the last visit:  Yes Any signs of clotting since the last visit (including chest discomfort, dizziness, shortness of breath, arm tingling, slurred speech, swelling or redness in leg):  Yes      Signs of Clotting:  Trouble breathing.  MEDICATIONS AMIODARONE HCL 200 MG TABS (AMIODARONE HCL) Take 1 tablet daily ASPIR-81 81 MG TBEC (ASPIRIN) Take 1 tablet by mouth once a day CALCIUM 600-D 600-400 MG-UNIT TABS (CALCIUM  CARBONATE-VITAMIN D) two times a day COUMADIN 2 MG TABS (WARFARIN SODIUM) 2mg  once daily LASIX 40 MG TABS (FUROSEMIDE) 1 tablet by mouth once a day POTASSIUM CHLORIDE CRYS CR 20 MEQ TBCR (POTASSIUM CHLORIDE CRYS CR) Take 1 tablet once a day LEVOTHYROXINE SODIUM 100 MCG TABS (LEVOTHYROXINE SODIUM) one by mouth daily CRESTOR 10 MG TABS (ROSUVASTATIN CALCIUM) one by mouth daily RAMIPRIL 10 MG CAPS (RAMIPRIL) 1 once daily TOPROL XL 50 MG XR24H-TAB (METOPROLOL SUCCINATE) one by mouth daily  note the new strength    Laboratory Results   Blood Tests      INR: 2.2   (Normal Range: 0.88-1.12   Therap INR: 2.0-3.5) Comments: Rita Ohara  February 27, 2010 11:35 AM

## 2010-10-15 ENCOUNTER — Encounter (INDEPENDENT_AMBULATORY_CARE_PROVIDER_SITE_OTHER): Payer: Medicare Other | Admitting: Internal Medicine

## 2010-10-15 ENCOUNTER — Encounter: Payer: Self-pay | Admitting: Internal Medicine

## 2010-10-15 DIAGNOSIS — I4891 Unspecified atrial fibrillation: Secondary | ICD-10-CM

## 2010-10-15 DIAGNOSIS — I428 Other cardiomyopathies: Secondary | ICD-10-CM

## 2010-10-15 DIAGNOSIS — I5023 Acute on chronic systolic (congestive) heart failure: Secondary | ICD-10-CM

## 2010-10-22 NOTE — Assessment & Plan Note (Signed)
Summary: defib check.bsx.amber/s/p av node ablation/kl  Medications Added METOPROLOL TARTRATE 50 MG TABS (METOPROLOL TARTRATE) take 1 1/2 tablets by mouth bid        Visit Type:  Follow-up Primary Provider:  Stacie Glaze MD   History of Present Illness: This is a 77 year old white male patient who recently underwent successful insertion of a new [employer] ICD as well as a new left ventricular pacing lead for ICD device  ERI. He has had a lot of trouble with maintaining sinus rhythm and we have settled now for only rate control. Today he notes that he feels as well as he did when on amiodarone and in NSR.  No ICD shocks.  He denies peripheral edema.  His skin is improved with stopping amiodarone.   Current Medications (verified): 1)  Aspir-81 81 Mg Tbec (Aspirin) .... Take 1 Tablet By Mouth Once A Day 2)  Calcium 600-D 600-400 Mg-Unit Tabs (Calcium Carbonate-Vitamin D) .... Two Times A Day 3)  Coumadin 2 Mg Tabs (Warfarin Sodium) .... 2mg  Once Daily 4)  Lasix 40 Mg Tabs (Furosemide) .Marland Kitchen.. 1 Tablet By Mouth Once A Day 5)  Potassium Chloride Crys Cr 20 Meq Tbcr (Potassium Chloride Crys Cr) .... Take 1 Tablet Once A Day 6)  Levothyroxine Sodium 100 Mcg Tabs (Levothyroxine Sodium) .... One By Mouth Daily 7)  Crestor 10 Mg Tabs (Rosuvastatin Calcium) .... One By Mouth Daily 8)  Ramipril 10 Mg Caps (Ramipril) .Marland Kitchen.. 1 Once Daily 9)  Metoprolol Succinate 50 Mg Xr24h-Tab (Metoprolol Succinate) .... 3 Tabs By Mouth At Bedtime  Allergies: 1)  ! Penicillin 2)  Amoxicillin (Amoxicillin) 3)  Bactrim Ds (Sulfamethoxazole-Trimethoprim) 4)  Vytorin (Ezetimibe-Simvastatin)  Past History:  Past Medical History: Last updated: 02/12/2007 nonischemic cardiomypathy tachycardia-induced cardiomypathy left bundle branch block Congestive heart failure Atrial fibrillation/s/p ablation Anticoagulation therapy Hypertension Hypothyroidism AVR defibrillator Hyperlipidemia  Past Surgical  History: Last updated: 04/13/2007 Aortic Valve Replacement:  Coronary artery bypass graft pacer/efibrillator 2006  Review of Systems       The patient complains of dyspnea on exertion.  The patient denies chest pain, syncope, and peripheral edema.    Vital Signs:  Patient profile:   77 year old male Height:      65 inches Weight:      178 pounds BMI:     29.73 Pulse rate:   68 / minute BP sitting:   168 / 88  (left arm)  Vitals Entered By: Laurance Flatten CMA (October 15, 2010 3:38 PM)  Physical Exam  General:  average weight.   Head:  no abnormalities observed and no abnormalities palpated.   Eyes:  pupils equal and pupils round.   Neck:  No deformities, masses, or tenderness noted. Chest Wall:  Well healed ICD incision. Lungs:  normal respiratory effort.   slight end exp wheezing no rales Heart:  rregular rhythm with normal S1 and S2.    Abdomen:  soft and non-tender.   Msk:  no joint swelling and no joint warmth.   Pulses:  pulses normal in all 4 extremities Extremities:  No clubbing or cyanosis. Neurologic:  Alert and oriented x 3.    ICD Specifications Following MD:  Lewayne Bunting, MD     ICD Vendor:  [facility] [employer]     ICD Model Number:  [phone]     ICD Serial Number:  [id number] ICD DOI:  04/26/2010     ICD Implanting MD:  Lewayne Bunting, MD  Lead 1:    Location: RA  DOI: 03/11/2005     Model #: [id number]     Serial #: [id number]     Status: active Lead 2:    Location: RV     DOI: 03/11/2005     Model #: [id number]     Serial #: [id number]     Status: active Lead 3:    Location: LV     DOI: 03/11/2005     Model #: [id number]     Serial #: [id number]     Status: capped Lead 4:    Location: LV     DOI: 04/26/2010     Model #: [id number]     Serial #: [id number]     Status: active  Indications::  NICM, CHF  Explantation Comments: 04/26/10 [employer] [id number] explanted  ICD Follow Up ICD Dependent:  No      Episodes Coumadin:  Yes  Brady Parameters Mode VVIR     Lower Rate Limit:  80      Upper Rate Limit 120 PAV 180     Sensed AV Delay:  120  Tachy Zones VF:  240     VT:  200     VT1:  170     MD Comments:  Normal device function.  Impression & Recommendations:  Problem # 1:  CHRONIC SYSTOLIC HEART FAILURE (ICD-428.22) His symptoms remain Class 2. Will continue his current meds and maintain a low sodium diet. His updated medication list for this problem includes:    Aspir-81 81 Mg Tbec (Aspirin) .Marland Kitchen... Take 1 tablet by mouth once a day    Coumadin 2 Mg Tabs (Warfarin sodium) ..... 2mg  once daily    Lasix 40 Mg Tabs (Furosemide) .Marland Kitchen... 1 tablet by mouth once a day    Ramipril 10 Mg Caps (Ramipril) .Marland Kitchen... 1 once daily    Metoprolol Tartrate 50 Mg Tabs (Metoprolol tartrate) .Marland Kitchen... Take 1 1/2 tablets by mouth bid  Problem # 2:  AUTOMATIC IMPLANTABLE CARDIAC DEFIBRILLATOR SITU (ICD-V45.02) His device is working normally. Will recheck in several months.  Problem # 3:  HYPERTENSION (ICD-401.9) His blood pressure is elevated today but he claims it is normally well controlled. His updated medication list for this problem includes:    Aspir-81 81 Mg Tbec (Aspirin) .Marland Kitchen... Take 1 tablet by mouth once a day    Lasix 40 Mg Tabs (Furosemide) .Marland Kitchen... 1 tablet by mouth once a day    Ramipril 10 Mg Caps (Ramipril) .Marland Kitchen... 1 once daily    Metoprolol Tartrate 50 Mg Tabs (Metoprolol tartrate) .Marland Kitchen... Take 1 1/2 tablets by mouth bid  Patient Instructions: 1)  Your physician wants you to follow-up in: 12 months with Dr Court Joy will receive a reminder letter in the mail two months in advance. If you don't receive a letter, please call our office to schedule the follow-up appointment. Prescriptions: METOPROLOL TARTRATE 50 MG TABS (METOPROLOL TARTRATE) take 1 1/2 tablets by mouth bid  #90 x 11   Entered by:   Dennis Bast, RN, BSN   Authorized by:   Laren Boom, MD, [facility]   Signed by:   Dennis Bast, RN, BSN on 10/15/2010   Method used:   Electronically to        [facility]*  (retail)       [address]       [place], [place]  [id number]       Ph: [phone]       Fax: [phone]   RxID:  [id number]  

## 2010-10-25 ENCOUNTER — Other Ambulatory Visit (INDEPENDENT_AMBULATORY_CARE_PROVIDER_SITE_OTHER): Payer: Medicare Other | Admitting: Internal Medicine

## 2010-10-25 DIAGNOSIS — Z7901 Long term (current) use of anticoagulants: Secondary | ICD-10-CM

## 2010-10-31 NOTE — Cardiovascular Report (Signed)
Summary: Office Visit   Office Visit   Imported By: Roderic Ovens 10/25/2010 12:49:25  _____________________________________________________________________  External Attachment:    Type:   Image     Comment:   External Document

## 2010-11-05 LAB — PROTIME-INR: Prothrombin Time: 22.4 seconds — ABNORMAL HIGH (ref 11.6–15.2)

## 2010-11-07 LAB — BASIC METABOLIC PANEL
CO2: 28 mEq/L (ref 19–32)
Calcium: 9.1 mg/dL (ref 8.4–10.5)
Chloride: 106 mEq/L (ref 96–112)
Creatinine, Ser: 1.29 mg/dL (ref 0.4–1.5)
GFR calc Af Amer: >60 mL/min (ref >60)
Sodium: 142 mEq/L (ref 135–145)

## 2010-11-07 LAB — CBC
Hemoglobin: 12.8 g/dL — ABNORMAL LOW (ref 13.0–17.0)
Platelets: 173 10*3/uL (ref 150–400)
RBC: 5.4 MIL/uL (ref 4.22–5.81)
WBC: 9.2 10*3/uL (ref 4.0–10.5)

## 2010-11-07 LAB — SURGICAL PCR SCREEN
MRSA, PCR: NEGATIVE
Staphylococcus aureus: NEGATIVE

## 2010-11-07 LAB — PROTIME-INR
INR: 1.96 — ABNORMAL HIGH (ref 0.00–1.49)
Prothrombin Time: 22.4 seconds — ABNORMAL HIGH (ref 11.6–15.2)
Prothrombin Time: 22.5 seconds — ABNORMAL HIGH (ref 11.6–15.2)

## 2010-11-21 ENCOUNTER — Ambulatory Visit (INDEPENDENT_AMBULATORY_CARE_PROVIDER_SITE_OTHER): Payer: Medicare Other | Admitting: Internal Medicine

## 2010-11-21 ENCOUNTER — Other Ambulatory Visit: Payer: Self-pay | Admitting: Internal Medicine

## 2010-11-21 DIAGNOSIS — Z7901 Long term (current) use of anticoagulants: Secondary | ICD-10-CM

## 2010-11-21 NOTE — Patient Instructions (Signed)
2mg. Every day (Same Dose)  

## 2010-12-04 LAB — DIFFERENTIAL
Basophils Absolute: 0.1 10*3/uL (ref 0.0–0.1)
Basophils Relative: 1 % (ref 0–1)
Eosinophils Absolute: 0 10*3/uL (ref 0.0–0.7)
Eosinophils Relative: 0 % (ref 0–5)
Lymphs Abs: 0.9 10*3/uL (ref 0.7–4.0)
Neutrophils Relative %: 77 % (ref 43–77)

## 2010-12-04 LAB — CBC
HCT: 42.3 % (ref 39.0–52.0)
MCHC: 34.6 g/dL (ref 30.0–36.0)
MCV: 91.1 fL (ref 78.0–100.0)
Platelets: 149 10*3/uL — ABNORMAL LOW (ref 150–400)
RDW: 14.2 % (ref 11.5–15.5)

## 2010-12-04 LAB — URIC ACID: Uric Acid, Serum: 5.5 mg/dL (ref 4.0–7.8)

## 2010-12-06 ENCOUNTER — Other Ambulatory Visit: Payer: Self-pay | Admitting: Internal Medicine

## 2010-12-06 DIAGNOSIS — I10 Essential (primary) hypertension: Secondary | ICD-10-CM

## 2010-12-11 ENCOUNTER — Encounter: Payer: Self-pay | Admitting: Internal Medicine

## 2010-12-20 ENCOUNTER — Encounter: Payer: Self-pay | Admitting: Internal Medicine

## 2010-12-20 ENCOUNTER — Ambulatory Visit (INDEPENDENT_AMBULATORY_CARE_PROVIDER_SITE_OTHER): Payer: Medicare Other | Admitting: Internal Medicine

## 2010-12-20 VITALS — BP 140/84 | HR 80 | Temp 98.2°F | Resp 16 | Ht 66.0 in | Wt 180.0 lb

## 2010-12-20 DIAGNOSIS — I10 Essential (primary) hypertension: Secondary | ICD-10-CM

## 2010-12-20 DIAGNOSIS — Z7901 Long term (current) use of anticoagulants: Secondary | ICD-10-CM

## 2010-12-20 DIAGNOSIS — I4891 Unspecified atrial fibrillation: Secondary | ICD-10-CM

## 2010-12-20 DIAGNOSIS — M1710 Unilateral primary osteoarthritis, unspecified knee: Secondary | ICD-10-CM

## 2010-12-20 MED ORDER — HYLAN 8 MG/ML IX INJ
8.0000 mg | INJECTION | INTRA_ARTICULAR | Status: AC
  Administered 2010-12-20 – 2010-12-27 (×2): 8 mg via INTRA_ARTICULAR

## 2010-12-20 NOTE — Assessment & Plan Note (Signed)
Has seen the orthopedist and was told that it was arthritis and did not knee replacement Will arrange synvisc

## 2010-12-20 NOTE — Patient Instructions (Signed)
2mg. Every day (Same Dose)  

## 2010-12-20 NOTE — Assessment & Plan Note (Signed)
Stable blood pressure 

## 2010-12-20 NOTE — Assessment & Plan Note (Addendum)
In sinus rhythm today On chronic anticoagulations Last protime  2.5 Stable ofr knee injection

## 2010-12-26 ENCOUNTER — Other Ambulatory Visit: Payer: Self-pay | Admitting: *Deleted

## 2010-12-26 MED ORDER — FUROSEMIDE 40 MG PO TABS: 40.0000 mg | ORAL_TABLET | Freq: Every day | ORAL | Status: DC

## 2010-12-27 ENCOUNTER — Ambulatory Visit (INDEPENDENT_AMBULATORY_CARE_PROVIDER_SITE_OTHER): Payer: Medicare Other | Admitting: Internal Medicine

## 2010-12-27 ENCOUNTER — Ambulatory Visit: Payer: Medicare Other | Admitting: Internal Medicine

## 2010-12-27 VITALS — BP 130/80 | HR 72 | Temp 98.2°F | Resp 16

## 2010-12-27 DIAGNOSIS — M1710 Unilateral primary osteoarthritis, unspecified knee: Secondary | ICD-10-CM

## 2010-12-27 DIAGNOSIS — M129 Arthropathy, unspecified: Secondary | ICD-10-CM

## 2010-12-27 DIAGNOSIS — M1990 Unspecified osteoarthritis, unspecified site: Secondary | ICD-10-CM

## 2010-12-27 MED ORDER — HYLAN 8 MG/ML IX INJ: 16.0000 mg | INJECTION | INTRA_ARTICULAR | Status: DC

## 2010-12-27 NOTE — Progress Notes (Signed)
  Subjective:    Patient ID: [name], male    DOB: 09-04-1931, 77 y.o.   MRN: [id number]  HPI Patient presents for his second series of Synvisc injections for osteoarthritis   Review of Systems  he denies any swelling at the site of his knee injection he has had some pain relief and has increased ambulation    Objective:   Physical Exam    on physical examination he is no erythema or soft tissue swelling of the knee skin is intact    Assessment & Plan:  Patient gave informed consent and 2 cc of Synvisc was injected into his right knee patient tolerated the procedure aftercare was discussed with the patient

## 2011-01-03 ENCOUNTER — Ambulatory Visit (INDEPENDENT_AMBULATORY_CARE_PROVIDER_SITE_OTHER): Payer: Medicare Other | Admitting: Internal Medicine

## 2011-01-03 VITALS — BP 130/80 | HR 72 | Temp 98.2°F

## 2011-01-03 DIAGNOSIS — M1710 Unilateral primary osteoarthritis, unspecified knee: Secondary | ICD-10-CM

## 2011-01-03 MED ORDER — HYLAN 8 MG/ML IX INJ
16.0000 mg | INJECTION | INTRA_ARTICULAR | Status: DC
  Administered 2011-01-03: 16 mg via INTRA_ARTICULAR

## 2011-01-06 ENCOUNTER — Encounter: Payer: Self-pay | Admitting: Internal Medicine

## 2011-01-06 NOTE — Progress Notes (Signed)
  Subjective:    Patient ID: [name], male    DOB: 23-Jan-1932, 77 y.o.   MRN: [id number]  HPI   severe osteoarthritis of the right knee this is her third in a series of Synvisc injections.    Review of Systems  Constitutional: Negative.   HENT: Negative.   Eyes: Positive for discharge.  Respiratory: Negative.   Cardiovascular: Negative.   Skin: Negative.        Objective:   Physical Exam Blood pressure 130/80, pulse 72, temperature 98.2 F (36.8 C).     Patient has improved range of motion and less swelling in his right knee   Assessment & Plan:   Informed consent obtained and the patient's right knee was prepped with Betadine local anesthesia obtained with topical spray 2 cc of Synvisc was injected into the joint space the patient tolerated the injection well post injection care discussed with patient ice placed

## 2011-01-07 NOTE — Assessment & Plan Note (Signed)
[facility]                         GASTROENTEROLOGY OFFICE NOTE   NAME:[name], [name]                       MRN:          [id number]  DATE:03/17/2007                            DOB:          1932-06-24    REASON FOR REFERRAL:  To consider screening colonoscopy.  Patient is at  high risk given his Coumadin and defibrillator.   [name] is a very pleasant 77 year old man who has been on chronic  Coumadin therapy since an aortic St. Jude valve was placed several years  ago.  He also had a defibrillator placed 2 years ago.  He has CHF I  believe with an ejection fraction in the 30% to 50% range.  He generally  has no problems with his bowels, no constipation, no diarrhea.  He does  notice a very mild intermittent rectal bleeding.  He had a flexible  sigmoidoscopy 10 to 15 years ago, but has not had screening colonoscopy  previously.   REVIEW OF SYSTEMS:  Essentially normal and is available on his nursing  intake sheet.   PAST MEDICAL HISTORY:  Hypertension, aortic St. Jude valve in the 1990s,  status post pacemaker defibrillator placement, [employer]. Hypothyroid,  elevated cholesterol.   CURRENT MEDICINES:  Calcium, [facility].  Potassium, aspirin, Lasix, Welchol, amiodarone, Synthroid, metoprolol,  Azor.   ALLERGIES:  To PENICILLIN and VYTORIN.   SOCIAL HISTORY:  Married, lives with his wife, former truck driver,  nonsmoker, nondrinker.   FAMILY HISTORY:  No colon cancer, colon polyps in family.   PHYSICAL EXAMINATION:  VITAL SIGNS:  Stable.  CONSTITUTIONAL:  Alert and oriented x3.  EYES:  Extraocular movements intact.  MOUTH:  Oropharynx moist, no lesions.  NECK:  Supple.  No lymphadenopathy.  CARDIOVASCULAR:  Heart regular rate and rhythm.  Defibrillator,  pacemaker in left chest.  LUNGS:  Clear to auscultation bilaterally.  ABDOMEN:  Soft, nontender, nondistended.  Normal bowel sounds.  EXTREMITIES:  No lower  extremity edema.  SKIN:  No rashes or lesions on visible extremities.   ASSESSMENT AND PLAN:  A 77 year old man at routine risk for colorectal  cancer and at elevated risk for complications given ongoing Coumadin use  and defibrillator placement.   We will communicate with his cardiologist Dr. Lewayne Bunting to discuss  the safest way to bridge him off of Coumadin and then we will enact that  plan via his [facility] contact, Shelby Dubin.  We will also contact  [employer] to arrange for the defibrillator to be temporarily turned off  during the time of his colonoscopy.  I see no reason for any further  blood tests or imaging studies prior to then.     Rachael Fee, MD  Electronically Signed    DPJ/MedQ  DD: 03/17/2007  DT: 03/17/2007  Job #: [id number]   cc:   Stacie Glaze, MD  Doylene Canning. Ladona Ridgel, MD

## 2011-01-07 NOTE — Assessment & Plan Note (Signed)
[name]                         ELECTROPHYSIOLOGY OFFICE NOTE   NAME:ROBBINSJorel, Bryan                       MRN:          [id number]  DATE:09/05/2008                            DOB:          1931/10/25    [name] returns today for followup.  He is a very pleasant middle-  aged male with nonischemic cardiomyopathy, congestive heart failure, and  atrial fib and flutter.  He has a left bundle-branch block and is status  post BiV ICD insertion carried out back in July 2006.  He has had  refractory AFib, but we have controlled it very nicely with fairly high  doses of amiodarone (300 daily) for the last several years.  He returns  today for followup.  He denies chest pain or shortness of breath.  He  has had no palpitations or other symptoms of AFib.  His heart failure is  well controlled.   His medicines include:  1. Ramipril 10 a day.  2. Synthroid 100 mcg daily.  3. Crestor 10 a day.  4. Potassium 20 a day.  5. Aspirin 81 a day.  6. Furosemide 40 a day.  7. Amiodarone 300 a day.  8. Benicar 40/12.5 daily.  9. Metoprolol ER 50 twice daily.   PHYSICAL EXAMINATION:  GENERAL:  He is a pleasant, well-appearing man in  no distress.  VITAL SIGNS:  Blood pressure was 132/83, the pulse was 63 and regular,  respirations were 18; and the weight was 178 pounds, unchanged from his  visit a year ago.  NECK:  No jugular venous distention.  LUNGS:  Clear bilaterally to auscultation.  No wheezes, rales, or  rhonchi are present.  CARDIOVASCULAR:  Regular rate and rhythm.  Normal S1 and S2.  I do not  appreciate any murmurs.  ABDOMEN:  Soft and nontender.  EXTREMITIES:  No edema.  SKIN:  Amiodarone changes with a bluish tint over the face, particularly  of the nose.   Interrogation of his defibrillator demonstrates a Guidant Contak H-170.  There were no P-waves and no R-waves secondary to underlying sinus  bradycardia and a high-grade heart block.  The  impedance was 627 in the  atrium, 569 in the ventricle, 645 in the left ventricle with threshold  0.8 at 0.5 in the right atrium, 0.6 at 0.5 in the RV, and 2.8 at 1.2 in  the LV.  Today, we changed his LV outputs to 3-1/2 at 1.2 to help  improve battery longevity and ensure capture.  Battery voltage today was  2.58 V.   IMPRESSION:  1. Nonischemic cardiomyopathy.  2. Congestive heart failure.  3. Status post biventricular implantable cardioverter-defibrillator      insertion.   DISCUSSION:  Overall, [name] is stable.  His defibrillator is  working normally.  We will see the patient back for ICD followup in 1  year.     Doylene Canning. Bryan Ridgel, MD  Electronically Signed    GWT/MedQ  DD: 09/05/2008  DT: 09/06/2008  Job #: [id number]

## 2011-01-07 NOTE — Assessment & Plan Note (Signed)
[facility]                         ELECTROPHYSIOLOGY OFFICE NOTE   NAME:ROBBINSAundra, Espin                       MRN:          [id number]  DATE:09/21/2007                            DOB:          28-Dec-1931    Mr. Schnackenberg returned today for follow-up.  He is a very pleasant 77-year-  old male with a nonischemic cardiomyopathy, congestive heart failure and  recurrent atrial arrhythmias along with ventricular tachycardia who is  status post BiV ICD insertion.  He returns today for follow up.  Overall, he continues to do well with heart failure symptoms that are  class I.  Repeat echo done over a year ago following upgrade to a BiV  ICD and following restoration of sinus rhythm resulted in normal LV  systolic function.  He continues to do quite well and had basically no  heart failure symptoms.  He is interested in joining an exercise club.   MEDICATIONS:  1. Calcium supplements.  2. Coumadin as directed.  3. Potassium 20 mEq daily.  4. Aspirin 81 a day.  5. Furosemide 40 mg daily.  6. Amiodarone 300 mg daily.  7. Metoprolol succinate 50 mg twice daily.  8. Azor 5/40 daily.  9. Synthroid 100 mcg daily.  10.Lipitor 20 mg daily.   PHYSICAL EXAMINATION:  GENERAL:  He is a pleasant well-appearing man in  no distress.  VITAL SIGNS:  Blood pressure today was 150/90, pulse was 61 and regular,  respirations were 18, weight was 178 pounds.  NECK:  No jugular distention.  LUNGS:  Clear bilaterally to auscultation.  No wheezes, rales or rhonchi  were present.  CARDIOVASCULAR:  Regular rate and rhythm with normal S1 and a mechanical  S2.  EXTREMITIES:  No edema.   Interrogation of his defibrillator demonstrates a Guidant Contak H-170.  The P-waves were not present secondary to sinus node dysfunction.  The R-  waves were also not present secondary to complete heart block.  Battery  voltage was 2.82 volts.  The patient was programmed DDDR at low rate of  60.   He was 100% A paced and 100% V paced.   IMPRESSION:  1. Nonischemic cardiomyopathy now with normalization of his left      ventricular function after BiV ICD implantation.  2. Atrial flutter status post ablation.  3. Atrial fibrillation maintaining sinus on amiodarone.  4. Complete heart block.   DISCUSSION:  Overall, Mr. Dom is stable.  His defibrillator is  working normally, and his heart failure is well controlled.  I recommend  that he be allowed to restart an exercise program.  Will see him back in  1 year, sooner should he have any additional medical problems.     Doylene Canning. Ladona Ridgel, MD  Electronically Signed    GWT/MedQ  DD: 09/21/2007  DT: 09/21/2007  Job #: [id number]

## 2011-01-10 NOTE — Discharge Summary (Signed)
[name], [name]                ACCOUNT NO.:  [id number]   MEDICAL RECORD NO.:  [id number]          PATIENT TYPE:  INP   LOCATION:  6527                         FACILITY:  [facility]   PHYSICIAN:  [name], M.D.  DATE OF BIRTH:  1932/07/06   DATE OF ADMISSION:  03/11/2005  DATE OF DISCHARGE:  03/12/2005                                 DISCHARGE SUMMARY   DISCHARGE DIAGNOSES:  1.  Discharging day #1, status post implantable Guidant Contak Renewal 3      model H170 cardioverter defibrillator with left ventricular lead      placement.  2.  Nonischemic cardiomyopathy at catheterization April 2006.  3.  Ejection fraction 25%.  4.  Hospitalized April 2006 for congestive heart failure precipitated by      atrial fibrillation and rapid ventricular rate.  5.  Status post DC cardioversion under amiodarone April 2006, failed      conversion.  6.  Class III congestive heart failure.  7.  Persistent atrial fibrillation despite amiodarone.   SECONDARY DIAGNOSES:  1.  Status post aortic valve replacement.  2.  Chronic Coumadin therapy.  3.  Hypothyroidism.   OPERATION/PROCEDURE:  March 11, 2005 implantation of Guidant cardioverter  defibrillator with biventricular lead augmentation, [name].   The patient has had a mild swelling at pocket site.  The patient is on  Coumadin.  His INR the day of discharge, July 19, is 2.2.  His complete  blood count the day of discharge, white cells 8.1, hemoglobin 14.1,  hematocrit 42.4, platelets 137.  [name] held pressure for five to eight  minutes at the cardioverter defibrillator site and there seemed to be some  resolution in the swelling.  The patient is not having undue pain there.   DISCHARGE MEDICATIONS:  1.  He is to hold the Coumadin the night of July 19 and restart Thursday,      July 20.  2.  Amiodarone 200 mg daily.  3.  Synthroid 50 mcg daily.  4.  Potassium chloride 20 mEq daily.  5.  Furosemide 40 mg daily.  6.   Enteric-coated aspirin 81 mg daily.  7.  Toprol-XL 50 mg daily.  8.  Crestor 2 mg daily at bedtime.   He is asked to keep his incision dry for the next seven days and to sponge  bathe until Tuesday July 25.  For pain, Tylenol 325 mg one to two tabs every  four to six hours.  He is asked not to drive for the next week, not to lift  any heavy weights for the next two weeks.  Mobility has been discussed with  the patient.  Chest x-ray shows that all three leads of this patient's  device are in appropriate position.  No pneumothorax.  The patient's device  has been interrogated and all __________ within normal limits.   FOLLOW UP:  1.  He is to follow up at [facility] at [address].  This      is [name] office.  He will see [name] Friday, October 20, at  2:30 p.m.  2.  At the ICD clinic Wednesday, March 26, 2005, at 9 a.m.   BRIEF HISTORY:  The patient is a 77 year old male.  He has a history of  congestive heart failure, left bundle branch block, an now persistent atrial  fibrillation.  The patient presented in April of this year with  decompensation of congestive heart failure secondary to atrial fibrillation  with rapid ventricular rate.  At that time he underwent left heart  catheterization which showed nononbstructive coronary artery disease.  At  that time an echocardiogram was done showing 20-30% ejection fraction.  The  patient was cardioverted under amiodarone.  However, subsequently failed  cardioversion.  The patient persists with severe left ventricular  dysfunction with ejection fraction of 25%.  Once again, the patient does  have left bundle branch block with a QRS of about 180 msec.  The patient is  definitely a candidate for cardioverter defibrillator with biventricular  implantation on elective basis.   HOSPITAL COURSE:  The patient presents on July 18 for elective implantation  of cardioverter defibrillator with biventricular left  ventricular pacing.  Procedure was done without difficulty.  He has slight hematoma with pocket  swelling notable at the post procedure day #1.  Coumadin  is on hold for  July 19.  He may restart it July 20 and discharge with medications and to  follow up as dictated.      [name]   GM/MEDQ  D:  03/12/2005  T:  03/12/2005  Job:  [id number]   cc:   [name], M.D. [facility]

## 2011-01-10 NOTE — Assessment & Plan Note (Signed)
[facility]                         ELECTROPHYSIOLOGY OFFICE NOTE   NAME:[name], [name]                       MRN:          [id number]  DATE:08/31/2006                            DOB:          [birth year]    [name] returns today for followup.  He is a very pleasant, middle-  age male with aortic valve replacement, atrial flutter status post  ablation, recurrent atrial fibrillation and tachycardia-induced  cardiomyopathy, who has been maintained in sinus rhythm now for several  months on amiodarone.  The patient had severe LV dysfunction for which  he underwent 2-D echocardiogram after returned to sinus rhythm,  demonstrating that it had normalized.  He has had no recurrent heart  failure symptoms, presently Class I.   PHYSICAL EXAMINATION:  GENERAL: On exam today, he is a pleasant, well-  appearing man in no acute distress.  VITAL SIGNS: Blood pressure was 136/84, pulse 64 and regular,  respirations 18.  The weight was 181 pounds.  NECK:  Revealed no jugular venous distention.  LUNGS: Clear bilaterally to auscultation.  No wheezes, rales, or  rhonchi.  CARDIOVASCULAR:  Regular rate and rhythm with normal S1 and S2.  EXTREMITIES:  Demonstrated no edema.   Interrogation of the defibrillator demonstrates a Guidant Contact 8170.  There were no P waves.  There were no R waves.  The impedance was 679 in  the atrium, 584 in the ventricle, 578 in the left ventricle.  Threshold  0.8 at 0.5 in the atrium, 0.6 at 0.5 in the right ventricle, 1.6 at 0.8  in the left ventricle.   IMPRESSION:  1. Nonischemic cardiomyopathy.  2. Congestive heart failure.  3. Atrial fibrillation.  4. Chronic amiodarone therapy.   DISCUSSION:  Overall, [name] is stable.  He underwent recent  pulmonary function testing demonstrating near normal DLCOs, FEV1s, and  FEV1 to FVC ratios.  He will continue on his present medical therapy and  follow back up with [place] in clinic  in one year and follow up with our  [place] program every 3 months.     Doylene Canning. Ladona Ridgel, MD  Electronically Signed    GWT/MedQ  DD: 08/31/2006  DT: 08/31/2006  Job #: [id number]

## 2011-01-10 NOTE — H&P (Signed)
NAMEELOISE, MULA NO.:  [id number]   MEDICAL RECORD NO.:  [id number]          PATIENT TYPE:  INP   LOCATION:  1825                         FACILITY:  [facility]   PHYSICIAN:  Vida Roller, M.D.   DATE OF BIRTH:  10-28-1931   DATE OF ADMISSION:  12/02/2004  DATE OF DISCHARGE:                                HISTORY & PHYSICAL   PRIMARY CARE PHYSICIAN:  Darryll Capers, M.D.   CARDIOLOGIST:  Doylene Canning. Ladona Ridgel, M.D.   HISTORY OF PRESENT ILLNESS:  Mr. Porreca is a 77 year old man who, over the  course of last month, has noticed dyspnea on exertion. It began relatively  insidiously with his daily exercise routine. He found that he would come to  a hill and find that he was a little bit short of breath after it. This  progressed over the course of a few weeks to the point where he was barely  able to make it up the hill. On Friday while mowing the lawn, he had  progressive dyspnea which did not resolve when he rested as he had  previously done. He denied any chest discomfort or neck pain, however, found  that he was extremely short of breath over the weekend, had an episode of  PND and three pillow orthopnea requiring him to sit up in his easy chair to  sleep, and he came to the emergency department today and was found to be in  atrial fibrillation with rapid ventricular response with evidence of  clinical heart failure.   PAST MEDICAL HISTORY:  Significant for aortic valvular disease. He is status  post aortic valve replacement with a St. Jude valve in 1992. The details of  that hospitalization are not available to [place]. It appears that a couple of  years ago, his LV function was mildly depressed at 35-40% on a  transesophageal echo that was done for unknown reasons. He has a history of  atrial flutter status post ablation back in 1992 after his aortic valve  replacement. He is on chronic Coumadin therapy with chronic left bundle  branch block. He has had a history of  congestive heart failure in the past  but this has been many years ago. He has chronic renal insufficiency. His  mitral valve has some moderate regurgitation.   MEDICATIONS:  Medications upon his presentation are hydrochlorothiazide at  an unknown dose, potassium at an unknown dose, amiodarone 100 mg a day,  Synthroid unknown dose, Coumadin as managed by our Coumadin Clinic, and  Crestor at an unknown dose and he takes the calcium carbonate.  He lives in  [place] with his wife. He is a retired [employer] architect. He has two children,  both of whom are healthy, no grandchildren. He used to smoke but he quit a  number of years ago. He will drink an occasional glass of wine, but rarely  drinks any alcohol other than that, and does not use illicit drugs.   FAMILY HISTORY:  His mother died at age 9 of old age. Father died at age 67  of heart attack. He has two  brothers, one of whom had a stroke, the other  one has hypertension.   REVIEW OF SYSTEMS:  No changes in his weight. No fever or chills, no recent  illness. He has no headache, sinus congestion, nasal discharge,  vocal  changes, or photophobia. He has no rashes on his skin, no shortness of  breath or dyspnea on exertion beyond that described in the history present  illness. He has no urinary frequency or urgency. He has chronic nocturia  which has been usually about three times an evening he will have to get up.  He denies any weakness, numbness, mood disturbances, anxiety, or depression.  He does have mild knee arthralgias from mild DJD but otherwise no other  musculoskeletal complaints. GI: No nausea, vomiting, bright red blood or  melena, no hematochezia, no changes in his bowel habits. He does not have  any polyuria, polydipsia, heat intolerance, and the remainder of his review  of systems is stable and negative.   PHYSICAL EXAMINATION:  GENERAL:  He is a well-developed, well-nourished  white male in no apparent distress who is  alert and oriented x4, and a  reasonably good historian.  VITAL SIGNS:  His blood pressure 152/90. His heart rates in atrial  fibrillation varies between 103-140. His respiratory rate is 18. He is  saturating 91% on 2 liters nasal cannula. He weighs 172.9 pounds. He is  afebrile.  HEENT:  Unremarkable.  NECK:  Supple. There is no jugular venous distension or carotid bruits  noted.  LUNGS:  Decreased breath sounds bilaterally at the bases with mild dullness  to percussion.  HEART:  He has a normal first heart sound. He has a mechanical second heart  sound. There is a 2/6 systolic murmur heard best in the upper right sternal  border.  ABDOMEN:  Soft, nontender.  LOWER EXTREMITIES: Without clubbing, cyanosis, or edema. His pulses were 1+  .  GU/RECTAL:  Deferred.  MUSCULOSKELETAL:  Unremarkable.  NEUROLOGIC:  Nonfocal.   LABORATORY DATA:  Chest x-ray shows mild cardiomegaly with early congestive  heart failure. Electrocardiogram shows atrial fibrillation with a left  bundle branch block and a heart rate of about 124. His laboratories are all  pending. His initial point of care enzymes were negative.   ASSESSMENT AND PLAN:  Problem 1. This is a man with atrial fibrillation with  rapid ventricular response. He is hemodynamically currently stable. This  appears to be a chronic problem and he is on amiodarone for this.   Problem 2. His second issue and a much bigger one is the dyspnea on  exertion. He has multiple cardiac risk factors and also has mildly depressed  LV systolic function, so, we will need to further evaluate this. He has  hypertension which is poorly controlled and hyperlipidemia. My plan is to  rate control him.  We will try initially a beta blocker to see if he will  tolerate this. His heart failure does not appear to be substantial and my  feeling is that this may benefit him. We will cycle his enzymes and will check an echocardiogram to assess his left ventricular  systolic function and  depending on the results of these findings, we will consider whether a  catheter or an adenosine Myoview would be indicated in his case. Will  obviously follow him aggressively.      JH/MEDQ  D:  12/02/2004  T:  12/02/2004  Job:  [id number]

## 2011-01-10 NOTE — Op Note (Signed)
NAMEJARRICK, [name]                ACCOUNT NO.:  [id number]   MEDICAL RECORD NO.:  [id number]          PATIENT TYPE:  INP   LOCATION:  4737                         FACILITY:  [facility]   PHYSICIAN:  Doylene Canning. Ladona Ridgel, M.D.  DATE OF BIRTH:  April 26, 1932   DATE OF PROCEDURE:  12/11/2004  DATE OF DISCHARGE:                                 OPERATIVE REPORT   PROCEDURE PERFORMED:  DC cardioversion.   INDICATIONS:  Persistent atrial fibrillation.   INTRODUCTION:  The patient is a 77 year old male with a history of  nonischemic cardiomyopathy, left bundle branch block, persistent atrial  fibrillation and atrial flutter status post flutter ablation who was  admitted to hospital after one month of increasing shortness of breath.  He  was in congestive heart failure. He was in atrial fibrillation with rapid  ventricular response. He was treated with diuretics and rate controlling  medications, and his Amiodarone dose was increased. He underwent right and  left heart catheterization demonstrating markedly elevated pulmonary  capillary wedge pressures. He is now referred for DC cardioversion.   PROCEDURE:  After informed consent was obtained, the patient was taken to  the diagnostic EP lab in a fasting state.  After the usual preparation, he  was sedated with 5 milligrams of Versed and 50 mcg of fentanyl. He was  cardioverted with 200 joules of synchronized biphasic energy through  electrodispersive pads in the anterior posterior position. This resulted in  restoration of sinus rhythm (sinus bradycardia). The patient tolerated  procedure well. He was returned to his room in satisfactory condition.   COMPLICATIONS:  There were no immediate procedure complications.   RESULTS:  This time successful DC cardioversion in a patient was persistent  atrial fibrillation.      GWT/MEDQ  D:  12/11/2004  T:  12/11/2004  Job:  [id number]   cc:   Stacie Glaze, M.D. [facility]

## 2011-01-10 NOTE — Cardiovascular Report (Signed)
[name], [name]                ACCOUNT NO.:  [id number]   MEDICAL RECORD NO.:  [id number]          PATIENT TYPE:  INP   LOCATION:  4737                         FACILITY:  [facility]   PHYSICIAN:  Carole Binning, M.D. LHCDATE OF BIRTH:  [birth year]   DATE OF PROCEDURE:  12/10/2004  DATE OF DISCHARGE:                              CARDIAC CATHETERIZATION   PROCEDURE PERFORMED:  Right and left heart catheterization with coronary  angiography.   INDICATIONS:  The patient is a 77 year old male with a history of previous  aortic valve replacement and cardiomyopathy. He presented with atrial  fibrillation and congestive heart failure. He was found by echocardiogram to  have an ejection fraction in the 20-30% range. He was referred for right  heart catheterization to assess his hemodynamics and coronary arteriography  to assess his coronary anatomy and rule out ischemic etiology of this  cardiomyopathy.   PROCEDURAL NOTE:  A 7-French sheath was placed in the right femoral vein, 6-  French sheath in the right femoral artery. Right heart catheterization was  performed with a Swan-Ganz catheter. Coronary arteriography was performed  with a 6-French, JL-5 and JR-4 catheters. We did not cross the aortic valve  as the patient has a St. Jude mechanical aortic valve. Contrast was  Visipaque. There are no complications.   RESULTS:  Hemodynamics right atrial mean pressure 25, right ventricular  pressure 53/80. Pulmonary artery pressure 56/40, pulmonary capillary wedge  mean pressure 47, aortic pressure 126/100.   Cardiac output by the thermodilution method is 3.6. Cardiac index 1.9.  Cardiac output by the Fick method is 2.9. Cardiac index 1.5.   CORONARY ARTERIOGRAPHY (CODOMINANT):  1.  Left main is normal.  2.  Left anterior descending artery has a diffuse 50% stenosis in mid      vessel, followed by a 40% in the distal vessel, and 50% stenosis in the      apical LAD. The LAD gives rise to  single small normal-sized diagonal      branch.  3.  Left circumflex is a large codominant vessel. It gives rise to large      branching ramus intermedius, a normal-sized obtuse marginal branch, and      2 normal size posterolateral branch. There is a 40% stenosis in the      ostium of the left circumflex, 50% stenosis in the mid vessel just after      the obtuse marginal branch. In the distal vessel there is a 40%      stenosis. The ramus intermedius has a 50% stenosis in the mid vessel.      The second posterior lateral branch has a diffuse 30% stenosis.  4.  Right coronary artery.  There is a small caliber codominant vessel      ending there is a very small posterior descending artery. There is a      diffuse 30% stenosis in the proximal mid vessel.   IMPRESSIONS:  1.  Significantly elevated right and left heart filling pressures with      significant pulmonary hypertension.  2.  Decreased cardiac output.  3.  Moderate diffuse, but nonobstructive coronary artery disease.   PLAN:  For medical therapy for his coronary disease and congestive heart  failure.      MWP/MEDQ  D:  12/10/2004  T:  12/10/2004  Job:  [id number]   cc:   Stacie Glaze, M.D. [facility]   Doylene Canning. Ladona Ridgel, M.D.

## 2011-01-10 NOTE — Discharge Summary (Signed)
[name], [name]                            ACCOUNT NO.:  [id number]   MEDICAL RECORD NO.:  [id number]                   PATIENT TYPE:  INP   LOCATION:  4702                                 FACILITY:  [facility]   PHYSICIAN:  Doylene Canning. Ladona Ridgel, M.D. [facility]           DATE OF BIRTH:  [birth year]   DATE OF ADMISSION:  04/12/2002  DATE OF DISCHARGE:  04/17/2002                           DISCHARGE SUMMARY - REFERRING   DISCHARGE DIAGNOSES:  1. Atrial fibrillation, status post direct current cardioversion.  Currently     in normal sinus rhythm.  2. Respiratory infection, treated.  3. Status post aortic valve replacement with a St. Jude valve in the past.  4. Long-term Coumadin use.  5. Hypertension, controlled.  6. Chronic left bundle branch block.  7. Chronic renal insufficiency.   ALLERGIES:  PENICILLIN.   HISTORY OF PRESENT ILLNESS:  The patient is a 77 year old male patient who  was admitted on April 12, 2002, with fever, chills, and profound weakness.  In addition, he was complaining of palpitations.  In the emergency room, he  was found to be in atrial fibrillation with rapid ventricular response.  Despite IV Cardizem, he remained in atrial fibrillation.  Amiodarone was  started and he did convert into atrial flutter with a 2:1 block.  At this  point, it was decided that he would benefit from [facility] and this was performed.  One hundred joules were delivered and the patient reverted to sinus rhythm  briefly and then back again to atrial fibrillation.  He required a second  countershock of 100 joules and remained in normal sinus rhythm.   HOSPITAL COURSE:  He did undergo a transesophageal echocardiogram and this  did reveal LV with mild dilatation with an EF of 35-40%, St. June mechanical  prosthesis in an aortic valve position, moderate valvular MR, LA dilatation,  RV mildly dilated, mild TR, and RA dilatation.  One concern was that the  patient did spike a fever and that he could have  an infection around the  valve, however, the 2-D echocardiogram did reveal a well-seated aortic valve  prosthesis without obvious vegetations or dehiscence.   The patient did undergo blood cultures which revealed no growth.  The urine  culture revealed no growth.  The respiratory culture did reveal rare gram-  positive cocci, but sensitivities or final diagnosis are still pending.  The  chest x-ray did reveal an opacity at the bases.  He was treated with IV  Rocephin and then switched over to azithromycin.   During his hospitalization, he was maintained on his Coumadin for  anticoagulation for his aortic valve and PAF.   By discharge, the patient had remained afebrile for approximately 48 hours.  He was feeling much better and felt like he was able to go home.  We  concurred with this decision.   DISPOSITION:  The patient was discharged to home in stable condition on  the  following medications.   DISCHARGE MEDICATIONS:  1. Coumadin, resume previous dose.  2. Zithromax 250 mg one p.o. q.d. for five days.  3. Amiodarone 400 mg a day.  4. Restart Micardis.  5. Hold Toprol for now.  6. Tylenol as needed for pain.   ACTIVITY:  No strenuous activity for two days.  May gradually increase his  activity as tolerated.   DIET:  Remain on a low-fat diet.   FOLLOW-UP:  He will need to be seen in the Coumadin Clinic this week to  assure that his pro time remains therapeutic and also to make sure that his  pro time does not prolong under the treatment with amiodarone and Zithromax.  The office will call him with an appointment for two weeks.  He will need a  blood pressure check at that time to assure that we do not need to add any  further blood pressure medications since he will be off of the Toprol for  now.   LABORATORY DATA:  Laboratory studies during this stay included a potassium  of 3.7, BUN 9, and creatinine 1.3.  The pro time on discharge was 23.9 with  an INR of 2.5.   Hemoglobin 13.6, hematocrit 40.5, white count 6.1, platelets  156.  TSH 3.028.     Guy Franco, P.A. [facility]                      Doylene Canning. Ladona Ridgel, M.D. [facility]    LB/MEDQ  D:  04/17/2002  T:  04/19/2002  Job:  [id number]

## 2011-01-10 NOTE — Discharge Summary (Signed)
[name], Bryan                ACCOUNT NO.:  [id number]   MEDICAL RECORD NO.:  [id number]          PATIENT TYPE:  INP   LOCATION:  4737                         FACILITY:  [facility]   PHYSICIAN:  Doylene Canning. Ladona Ridgel, M.D.  DATE OF BIRTH:  03/30/1932   DATE OF ADMISSION:  12/02/2004  DATE OF DISCHARGE:  12/11/2004                                 DISCHARGE SUMMARY   ADDENDUM:  The addendum concerns a pause noted on telemetry at about 1700  hours on December 11, 2004, just prior to the patient's discharge.  The patient  was discharging December 11, 2004, a 2.03 second pause was noted.  The reason  for the pause was that it was a post atrial fibrillation termination pause  of 2.03 seconds and the patient converting back into sinus rhythm.  The  question is why is there atrial fibrillation after a DC cardioversion just  briefly earlier that day.  Hopefully the patient will stay in sinus rhythm.  He is on amiodarone higher doses, also on metoprolol for rate control.  The  patient will have an electrocardiogram taken at the office when he presents  to the Coumadin clinic on Monday, December 16, 2004.      GM/MEDQ  D:  12/11/2004  T:  12/11/2004  Job:  [id number]   cc:   Doylene Canning. Ladona Ridgel, M.D.   Stacie Glaze, M.D. [facility]

## 2011-01-10 NOTE — Discharge Summary (Signed)
[name], [name]                ACCOUNT NO.:  [id number]   MEDICAL RECORD NO.:  [id number]          PATIENT TYPE:  INP   LOCATION:  4737                         FACILITY:  [facility]   PHYSICIAN:  Maple Mirza, P.A. DATE OF BIRTH:  25-Jan-1932   DATE OF ADMISSION:  12/02/2004  DATE OF DISCHARGE:  12/11/2004                                 DISCHARGE SUMMARY   DISCHARGE DIAGNOSES:  1. Admitted with progressive dyspnea over the course of weeks until      patient had dyspnea at rest.  2. Chest x-ray showing mild cardiomegaly, early congestive heart failure,      left pleural effusion.  BNP on admission was 564.7.  3. Atrial fibrillation with rapid ventricular rate exacerbating his      symptoms of congestive heart failure.  4. Let and right heart catheterization December 10, 2004, significant      pulmonary hypertension, moderate diffuse nonobstructive coronary artery      disease.  5. 2-D echocardiogram December 03, 2004.  Ejection fraction 20-30%.  Severe      diffuse left ventriculohypokinesis, decreased left ventricular      components, St. Jude prosthesis, with sewing ring normal.  No      perivalvular regurgitation, mild mitral regurgitation.  6. 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  7. Discharged from [facility] April 19, status post DC cardioversion April      19.  8. Lovenox bridging until therapeutic on Coumadin as an outpatient.  9. The patient presented with dyspnea as well as nausea and abdominal      pain.  Abdominal ultrasound April 12 shows gallbladder wall thickening,      no gallstones, no hydronephrosis.  Abdominal aorta  is 2.4 cm,  left      pleural effusion.  10.Amiodarone on admission was 100 mg daily, now increased to 400 m twice      daily.  We will reduce dose at future office visit.  11.Renal insufficiency.  Creatinine of 1.8 on April 19 as a result of      aggressive  diuresis.  He will have recheck lab work done on April 26.      He is asked to hold hydrochlorothiazide and Micardis at this point.     SECONDARY DIAGNOSES:  1. Status post aortic valve replacement with a St. Jude's mechanical      prosthesis in 1992 for aortic insufficiency.  2. Status post atrial flutter ablation in 2002.  3. Status post DC cardioversion and atrial fibrillation in 1992, also in      August 2003.  In August cardioversion ejection fraction was 40-50%     PROCEDURES:  1. 2-D echocardiogram was taken on April 1`1, 2006 showing ejection      fraction decreased at 20-30% severe, diffuse, left ventricular      hypokinesis, decreased left ventricular compliance.  Sewing ring on the      St. Jude prosthesis was normal.  No perivalvular regurgitation, mild      mitral regurgitation.  2. Left  heart catheterization, December 10, 2004.  Significant pulmonary      hypertension.  The LAD had tandem 50/70% stenosis after the diagonal.      Left circumflex had an ostial 40% stenosis.  There was a 50% stenosis      at the first obtuse marginal followed by the 40% stenosis.  There was a      30% stenosis at the second obtuse marginal.  A ramus intermediate had      the 50% stenosis at a bifurcation branch.  The right coronary artery      had a 30% proximal stenosis.  There was no left ventriculogram taken at      this catheterization.  3. December 11, 2004:  DC cardioversion in the presence of amiodarone,      increased dosing, with 200 joules of synchronized biphasic energy      applied, converting to sinus rhythm.  The patient is discharging in      sinus rhythm.     DISPOSITION:  Mr. Monday is discharging April 19, hospital day #10.  He had  undergone cardiac catheterization which showed moderate diffuse  nonobstructive coronary artery disease.  He had successfully diuresed seven  pounds of volume overload with increased respiratory reserve in capacity.  He has been cardioverted  on the day of discharge, April 19, and maintains  sinus rhythm.  He was discharged on the following medications:   DISCHARGE MEDICATIONS:  1. Levothyroxine 50 mcg daily.  2. Metoprolol 50 mg three times daily.  3. Calcium tablets three times daily.  4. Enteric-coated aspirin 81 mg daily.  5. Furosemide 40 mg twice daily (new medication).  6. Potassium chloride 20 mEq daily (new medication).  7. Coumadin 4 mg tablets, taking 4 mg on Thursday, 2 mg then daily      starting Friday, April 21.  8. Amiodarone 200 mg tablets, two tabs in the morning, two tabs in the      evening.  9. Lovenox 90 mg injected, seven o'clock in the morning, seven o'clock in      the evening.  He is asked to hold off on taking hydrochlorothiazide and      Micardis at this time for renal insufficiency.  10.Crestor 10 mg daily at bedtime.     DIET:  Low sodium, low cholesterol, 1500 cc fluid restriction diet which is  the equivalent of three 12 ounce Coke cans daily.  He is to weigh himself  daily and to call [phone] if he gains two or three pounds in 24 hours.   FOLLOW UP:  He has office visit with [facility], [address] for blood work Wednesday, December 18, 2004.  PT/ INR, BMET, and a CBC  will be taken.  He will see Dr. Ladona Ridgel Monday, Dec 23, 2004, at 3:15 p.m.  At  that time his amiodarone may be modified from 400 mg twice daily to perhaps  200 mg twice daily.   HISTORY OF PRESENT ILLNESS:  [name] is a 77 year old male who one month  course prior to this admission, had increasing dyspnea on exertion.  This  progressed until he was unable to make it up a hill where he usually takes  his walk.  While mowing the lawn the day before this admission he had  progressive dyspnea which did not resolve with rest.  He denied any chest  discomfort or neck pain.  He had episodes of paroxysmal nocturnal dyspnea. He has to  use three pillows in order to become comfortable.  He also had to  sit  up in his easy chair to sleep.  He presented to the emergency room on  April 10.  He was found to be in atrial fibrillation with rapid ventricular  rate with his chest x-ray showing evidence of clinical heart failure, as  well as elevated BNP.  The patient will be admitted.  His atrial  fibrillation will be treated for rate control.  He has left bundle branch  block.  His hypertension is poorly controlled.  He will be tried on a beta  blocker for control.  Enzymes will be cycled and an echocardiogram will be  obtained.  Depending on the results of the findings, cardiac catheterization  or stress test will be considered.  He will have aggressive diuresis with  Lasix.   HOSPITAL COURSE:  The patient presented with volume overload respiratory  distress, progressive over one month period until there was dyspnea at rest  and paroxysmal nocturnal dyspnea.  On April 10 he underwent aggressive  diuresis with Lasix which resulted in overall loss of seven pounds during  this hospitalization.  His amiodarone was increased from 100 mg daily to 200  mg at first and then escalated prior to discharge to 400 mg twice daily.  His hydrochlorothiazide and Micardis were held secondary to elevated  creatinine, raising his  __________ to 1.8 prior to discharge.  He had not  been on a beta blocker prior to this admission, but this was initiated upon  his admission and pushed to a level of 50 mg three times daily for rate  control.  The patient's Coumadin was held and he was ridged with Lovenox  until a subtherapeutic INR acceptable for left heart catheterization could  be obtained.  This ws obtained on April 18.  The patient underwent cardiac  catheterization that day with the results as dictated above, essentially a  nonobstructive, moderate, diffuse picture of coronary artery disease,  requiring continued medical therapy.  The patient then the following day  underwent DC cardioversion to a sinus rhythm with  concurrent amiodarone.  The patient was discharged with the medications and follow-up as dictated.      GM/MEDQ  D:  12/11/2004  T:  12/11/2004  Job:  [id number]   cc:   Doylene Canning. Ladona Ridgel, M.D.   Stacie Glaze, M.D. [facility]

## 2011-01-10 NOTE — Assessment & Plan Note (Signed)
[facility]                           ELECTROPHYSIOLOGY OFFICE NOTE   NAME:Sussman, OLUWADEMILADE MCKIVER                       MRN:          [id number]  DATE:03/13/2006                            DOB:          [birth year]    Mr. Sprong returns today for followup.  He is a very pleasant middle-aged  male with multiple medical problems, including aortic valve replacement,  atrial flutter, status post ablation, atrial fibrillation, which has been  very difficult to control on initial low dose amiodarone therapy, severe  congestive heart failure, status post BiV ICD implantation.  Mr. Hannen  returns for followup.  He was cardioverted just over a month ago, back to  sinus rhythm.  He has been on fairly high dose amiodarone at 400 mg daily  since then and returns for followup.  Prior to his cardioversion, the  patient had severe heart failure, at least class 3 and was not able to use  his LV pacing lead secondary to his rapid ventricular response in A fib.  He, of course, his underlying left bundle branch block.   The patient returns today feeling much improved, and he is back walking  without limitation.  He denies palpitations.   PHYSICAL EXAMINATION:  GENERAL:  He is a pleasant middle-aged man in no  distress.  VITAL SIGNS:  The blood pressure today was 150/90, pulse 60 and regular,  respirations 18.  Weight was 177 pounds.  NECK:  No jugular venous distention.  LUNGS:  Clear bilaterally to auscultation.  CARDIOVASCULAR:  Regular rate and rhythm with normal S1 and S2.  There was a  mechanical aortic valve closure sound present.  EXTREMITIES:  No edema.   Interrogation of his defibrillator demonstrates a Ar[employer]ist,  Model H170.  There were no underlying P or R waves secondary to pacemaker  dependence.  His pacing impedence was 709 in the atrium, 591 in the right  ventricle, 793 in the left ventricle.  The threshold is 0.6 at .5 in the  atrium and  in the right ventricle and 2.2 at 0.5 in the left ventricle.  __________ voltage was 3.15 volts.  There are no intercurrent ICD therapies.  The patient was maintained in sinus rhythm since his cardioversion.   IMPRESSION:  1.  Congestive heart failure.  2.  Atrial fibrillation with a rapid ventricular response.  3.  Left bundle branch block.  4.  Status post BiV ICD insertion.   DISCUSSION:  Mr. Fouse continues to do well with regard to his symptoms of  heart failure and his control of A fib.  Unfortunately, he has required  fairly high dose amiodarone to maintain sinus rhythm.  As of yet, he has had  no problems with this amiodarone.  I will plan to keep him on 400 a day for  the next three months unless he has problems.  At this point, we will plan  to decrease his amiodarone dose to try to minimize lung toxicity or other  side effects from  amiodarone.  He is instructed to maintain a low sodium diet and take his  other medications.  His BiV defibrillator is working normally.                                   Doylene Canning. Ladona Ridgel, MD   GWT/MedQ  DD:  03/13/2006  DT:  03/13/2006  Job #:  [id number]

## 2011-01-10 NOTE — Discharge Summary (Signed)
[place]. [facility]  Patient:    [name], [name]                         MRN: [id number] Adm. Date:  [id number] Disc. Date: [id number] Attending:  Lewayne Bunting Dictator:   Annett Fabian, P.A. CC:         Doylene Canning. Ladona Ridgel, M.D. [facility]  Maisie Fus D. Riley Kill, M.D. [facility]  Stacie Glaze, M.D. [facility]   Discharge Summary  DISCHARGE MEDICATIONS: 1. Lovenox 80 mg one injection every 12 hours start on October 18, 2000. 2. Toprol XL 100 mg q.d. 3. Micardis 40 mg q.d. 4. Coumadin 4 mg q.d.  DISCHARGE INSTRUCTIONS:  Patient was advised to avoid driving, heavy lifting, tub baths, sexual activity until October 19, 2000.  Patient is to follow a low fat, low cholesterol diet.  Patient was advised to watch for pain, bleeding, or swelling of the catheterization site and to call the [place] office for any problems.  The patient was advised not to take his digoxin from home.  Patient is to follow up in the Coumadin clinic on Monday.  The office is to call.  He is also instructed to call the Coumadin clinic if he had not heard by noon.  While at the [facility] office, patient is to make an appointment for followup with Dr. Ladona Ridgel in three to four weeks.  LABORATORY VALUES:  TSH 2.122.  White count 9.5, hemoglobin 15.2, hematocrit 45.5, platelet count 160.  PT 18.1, INR of 1.8.  Sodium 136, potassium 3.7, chloride 106, CO2 25, BUN 21, creatinine 1.1, with a glucose of 108.  Chest x-ray revealed previous median sternotomy with cardiomegaly.  There is some mild perihilar and bibasilar interstitial pulmonary edematous changes. There were no focal infiltrates. DD:  10/17/00 TD:  10/19/00 Job: [id number]

## 2011-01-10 NOTE — Discharge Summary (Signed)
[place]. [facility]  Patient:    [name], [name]                         MRN: [id number] Adm. Date:  10/15/00 Disc. Date: 10/17/00 Attending:  Lewayne Bunting Dictator:   Annett Fabian, P.A.-C. CC:         Doylene Canning. Ladona Ridgel, M.D. [facility]   Discharge Summary  DISCHARGE DIAGNOSES: 1. Atrial flutter, status post catheter ablation. 2. History of aortic valve replacement.  HOSPITAL COURSE:  Patient was admitted to the hospital on October 15, 2000, for an expected catheter ablation the following day.  On February 22, the patient was taken to the EP lab by Dr. Sharrell Ku.  Normal sinus rhythm was noted after two radiofrequency applications.  ______ was noticed after the third application.  Two further bonus applications were made.  The patient was observed to have no isthmus conduction after the procedure.  After the procedure, Dr. Ladona Ridgel ordered the patient to be restarted on his Coumadin and to receive Lovenox the following morning.  The next day, the patient reported no chest pain or shortness of breath. However, he did complain of his neck being slightly sore.  Dr. Ladona Ridgel ordered the patient to be given Lovenox again that evening.  He was to be sent home on Lovenox until followed in the Coumadin clinic.  Otherwise, he was stable for discharge.  DISCHARGE MEDICATIONS: 1. Lovenox 80 mg subcutaneously every 12 hours. 2. Toprol XL 100 mg daily. 3. Micardis 40 mg q.d. 4. Coumadin 4 mg q.d.  DISCHARGE INSTRUCTIONS: 1. Patient was advised to avoid driving, heavy lifting, tub bath, sexual    activity until February 25. 2. He is advised to go on a low-fat, low-cholesterol diet. 3. He is to watch the site for pain, bleeding, or swelling, and to call the    [place] office for any of these problems. 4. He is advised to discontinue taking digoxin. 5. He is to follow up in the Coumadin clinic the following Monday. 6. He is to call the [place] office the following  Monday for an appointment    with Dr. Ladona Ridgel within three to four weeks.  LAB TESTS:  White count 9.5, hemoglobin 15.2, hematocrit 45.5, RDW 14.2, platelets 160, PT 18.1, INR 1.8, PTT 35.  Sodium 136, potassium 3.7, chloride 106, CO2 25, BUN 21, creatinine 1.1, glucose 108, TSH 2.122.  Chest x-ray revealed congestive heart failure with some mild bibasilar interstitial pulmonary edema.  A 2-D electrocardiogram was performed on February 22.  Overall left ventricular systolic function was mildly decreased.  Ejection fraction was estimated to be between 40 and 50%.  There was noted to be mild diffuse left ventricular hypokinesis as well as hypokinesis of the inferior septal wall. The left ventricular wall thickness was noted to be at the upper limits of normal.  The aortic valve prosthesis appeared to be seated and functioning normally.  The mean transaortic valve gradient was noted to be 8 mmHg.  There was mild mitral regurgitation.  There was mild dilation of the left atrium. There was mild to moderate tricuspid regurgitation.  The right atrium was noted to be mildly to moderately dilated. DD:  11/20/00 TD:  11/22/00 Job: [id number]

## 2011-01-10 NOTE — Discharge Summary (Signed)
[place]. [facility]  Patient:    [name], [name]                         MRN: [id number] Adm. Date:  [id number] Disc. Date: [id number] Attending:  Lewayne Bunting Dictator:   Brita Romp, P.A.                           Discharge Summary  DISCHARGE DIAGNOSES: 1. Atrial flutter, status post ablation. 2. Status post aortic valve replacement. 3. Chronic Coumadin therapy. 4. Hypertension.  HOSPITAL COURSE: The patient presented to the hospital on October 16, 2000, for a scheduled admission.  The patient was in atrial flutter with a 2:1 conduction at the time of admission.  The following morning, the patient underwent a 2-D echocardiogram.  Overall, the left ventricular systolic function was mildly decreased with an ejection fraction estimated between 40-50%.  There was mild diffuse left ventricular hypokinesis and inferior septal wall hypokinesis.  Left ventricular wall thickness was evidenced as normal.  The aortic prosthesis appeared to be in place with trivial periprosthetic aortic regurgitation.  There was mild mitral valve regurgitation.  The left atrium was mildly dilated.  There was mild to moderate tricuspid regurgitation.  The right atrium was mildly to moderately dilated.  Later that afternoon, the patient was taken to the electrophysiology lab by Dr. Lewayne Bunting.  Dr. Ladona Ridgel performed electrophysiology study and radiofrequency ablation.  A total of five radiofrequency energy applications were made.  Three of these were regular with two bonus applications.  The patient converted to a normal sinus rhythm without any complications.  The patient was also enrolled in the "Conductr" study.  The following day, the patient was again seen by Dr. Ladona Ridgel.  The patient remained in normal sinus rhythm.  The patients Coumadin was restarted.  He was also given one dose of subcu Lovenox 80 mg.  He is to get subsequent doses the day after discharge with follow-up in  the Coumadin Clinic on Monday. DD:  10/17/00 TD:  10/19/00 Job: [id number]

## 2011-01-10 NOTE — Op Note (Signed)
[name], NULL                ACCOUNT NO.:  [id number]   MEDICAL RECORD NO.:  [id number]          PATIENT TYPE:  AMB   LOCATION:  ENDO                         FACILITY:  [facility]   PHYSICIAN:  Doylene Canning. Ladona Ridgel, M.D.  DATE OF BIRTH:  [birth year]   DATE OF PROCEDURE:  12/22/2005  DATE OF DISCHARGE:                                 OPERATIVE REPORT   PROCEDURE PERFORMED:  Direct current cardioversion.   INDICATIONS:  Atrial fibrillation with tachycardia-induced cardiomyopathy.   INTRODUCTION:  The patient is a 77 year old man with a nonischemic  cardiomyopathy and incessant atrial fibrillation and flutter, who is status  post AVR with left bundle branch block and severe congestive heart failure.  He underwent bi-V ICD implantation with transient left ventricular high  pacing thresholds.  The patient developed severe congestive heart failure,  as he did not feel his heart racing and had in part a tachycardia-induced  cardiomyopathy with very difficult-to-control ventricular rates.  The  patient has been coagulated now for several weeks.  Approximately three  weeks ago he had an INR of 1.9.  Since then he has been therapeutic two  occasions with his INR.  The patient underwent TEE by Dr. Jens Som, where  smoke was seen in his left atrium and the left atrium was enlarged, but  there was no clear-cut thrombus noted in the left atrial appendage or else  where in the left atrium.  He is now referred for DC cardioversion.   PROCEDURE:  After informed consent was obtained, the patient was prepped and  positioned with electrodispersive pads in the anterior-posterior position.  After the anesthesia service was utilized to obtain anesthesia with sodium  Pentothal, the patient was cardioverted with 200 joules of synchronized  biphasic energy.  After this it was found that the timing intervals were  such that his LV was 170 msec activation after the RV.  As such, his device  was reprogrammed so that  the LV with the 60 msec earlier than the RV for  optimization of bi-V pacing.  He was returned to the holding area in  satisfactory condition.   COMPLICATIONS:  There were no immediate procedural complications.   RESULTS:  This demonstrate successful direct current cardioversion in a  patient with a tachycardia-induced cardiomyopathy as well as a valvular  heart disease-induced cardiomyopathy.           ______________________________  Doylene Canning. Ladona Ridgel, M.D.     GWT/MEDQ  D:  12/22/2005  T:  12/23/2005  Job:  [id number]

## 2011-01-10 NOTE — Op Note (Signed)
NAMECHESTON, COURY                ACCOUNT NO.:  [id number]   MEDICAL RECORD NO.:  [id number]          PATIENT TYPE:  INP   LOCATION:  6527                         FACILITY:  [facility]   PHYSICIAN:  Doylene Canning. Ladona Ridgel, M.D.  DATE OF BIRTH:  [birth year]   DATE OF PROCEDURE:  03/11/2005  DATE OF DISCHARGE:                                 OPERATIVE REPORT   PROCEDURE PERFORMED:  Implantation of a biventricular ICD.   INTRODUCTION:  The patient is a 77 year old male with a nonischemic  cardiomyopathy. He has had paroxysmal atrial fibrillation and left bundle  branch block in the past but over the last several months, has had  increasing heart failure symptoms associated with uncontrolled atrial  fibrillation. This was despite fairly high dose Amiodarone (300 milligrams  daily). Because of his ongoing worsening congestive heart failure symptoms  and despite maximal medical therapy, the patient is referred for  biventricular ICD implantation. Of note, the patient has chronic renal  insufficiency and has been intolerant of ACE inhibitors or ARB's in the  past.   PROCEDURE IN DETAIL:  After informed consent was obtained, the patient was  taken to the diagnostic EP lab in the fasting state. After the usual  preparation and draping, intravenous fentanyl and midazolam were given for  sedation. Lidocaine 30 mL was infiltrated in the left infraclavicular  region. A 9 cm incision was carried out over this region. Electrocautery was  utilized to dissect down to the fascial plane. Contrast 10 mL was injected  into the left upper extremity venous system and demonstrated a patent left  subclavian vein. It was subsequently punctured x3 and the Guidant model [id number]  [id number] active fixation defibrillation lead was advanced into the right  ventricle. The Guidant model [id number] active fixation bipolar pacing lead  was advanced to the right atrium. Mapping was carried out in the right  ventricle and at the final  site on the RV septum, the R-waves measured 13  mV, the pacing impedance was 712 ohms, and the pacing threshold was 0.6  volts at 0.5 milliseconds once the lead was actively fixed. With the right  ventricular lead in satisfactory position, attention was then turned to  placement of the atrial lead. It was placed in the residual of the right  atrial appendage where fibrillation waves initially measured 2 mV and the  AOO pacing impedance with the lead actively fixed was 703 ohms. With the  patient's underlying fibrillation, the pacing threshold could not be  measured. With the right atrial and defibrillation leads in satisfactory  position, attention was then turned to placement of the left ventricular  pacing lead. The coronary sinus was cannulated with some difficulty. The  coronary sinus offset fairly low in the floor of the right atrium.  Venography of the coronary sinus was carried out. This demonstrated two  acceptable veins for LV pacing. The chosen vein was the lateral vein. It was  cannulated with the angioplasty guidewire. The Guidant EZ tract 2 IS1  passive fixation LV pacing lead serial number [id number] was then advanced  over the guidewire into  the lateral wall of the left ventricle. At this  location, the LV waves measured greater than 20 mV and the pacing impedance  was 1200 ohms. Pacing threshold was 0.8 volts at 0.5 milliseconds. Ten volt  pacing did not stimulate the diaphragm. With the left ventricular lead in  satisfactory position, all the leads were secured to the subpectoralis  fascia with a figure-of-eight silk suture. In addition, the sleeve was  secured with a silk suture. Electrocautery was utilized to make subcutaneous  pocket. Kanamycin irrigation was utilized to irrigate the pocket, and  electrocautery utilized to assure hemostasis. The Guidant contact renewal  III model H170 biventricular ICD serial number [id number] was connected to the  right atrial, right  ventricular, and left ventricular pacing and  defibrillation leads and placed in the subcutaneous pocket. Generator was  secured with a silk suture. The patient was more deeply sedated and  defibrillation threshold testing carried out.   After the patient was deeply sedated with fentanyl and Versed, VF was  induced with T-wave shock. A 14 joules shock was then delivered, which  terminated ventricular fibrillation, as well as atrial fibrillation,  restoring sinus rhythm. At this point, the P-waves and sinus rhythm measured  4 mV in the atrial threshold and was less 0.5 volts at 0.5 milliseconds.  After 5 minutes was elapsed, a second DFT test was carried out. Again, VF  was induced with a T-wave shock and again, a 14 joules shock was delivered  which terminated ventricular fibrillation and restored sinus rhythm.  Following this, no additional defibrillation threshold testing was carried  out and the incision was closed with a layer of 2-0 Vicryl followed by a  layer of 3-0 Vicryl. Benzoin was pained on the skin, Steri-Strips were  applied, and a pressure dressing was placed. The patient was returned to his  room in satisfactory condition.   COMPLICATIONS:  There were no immediate procedure complications.   RESULTS:  This demonstrates successful implantation of a Guidant  biventricular ICD in a patient with a nonischemic cardiomyopathy, class III  heart failure, atrial fibrillation, and left bundle branch block with a QRS  duration of 180 milliseconds.       GWT/MEDQ  D:  03/11/2005  T:  03/12/2005  Job:  [id number]   cc:   Stacie Glaze, M.D. [facility]

## 2011-01-10 NOTE — H&P (Signed)
[name], [name]                            ACCOUNT NO.:  [id number]   MEDICAL RECORD NO.:  [id number]                   PATIENT TYPE:  INP   LOCATION:  2930                                 FACILITY:  [facility]   PHYSICIAN:  Learta Codding, M.D. [facility]             DATE OF BIRTH:  1932/06/16   DATE OF ADMISSION:  04/12/2002  DATE OF DISCHARGE:                                HISTORY & PHYSICAL   CURRENT COMPLAINTS:  1. Fever and chills and profound weakness.  2. Palpitations.   HISTORY OF PRESENT ILLNESS:  The patient is a 77 year old male with a  history of aortic valvular disease, status post #25 St. Jude aortic valve  replacement in 1992 for severe aortic insufficiency.  The patient has no  known coronary artery disease, although he has not had a cardiac  catheterization in the last several years.  Over the last two years, he has  had recurrent problems with atrial tachyarrhythmias.  He was seen on several  occasions by Dr. Doylene Canning. Ladona Ridgel in consultation.  Most recently, he was  evaluated on October 15, 2000 when he presented with recurrent atrial  flutter and congestive heart failure.  Subsequently, the patient underwent  radiofrequency ablation for isthmus-dependent type 1 atrial flutter.  The  patient did well and was free of arrhythmias for a prolonged period of time,  up until this current admission.  In reviewing the patient's chart, he also  has been in atrial fibrillation in the past and Dr. Ladona Ridgel had recommended  on October 12, 2000, the possible use of Tikosyn if he developed recurrent  radiofrequency ablation for atrial flutter.  The patient is on chronic  Coumadin therapy for his aortic valve replacement.  Most recent INR was 2.5  two weeks ago.   The patient is now admitted through the emergency room because of recurrent  palpitations which he has noticed for the last several days.  In the ER, he  was found to be in atrial fibrillation with rapid ventricular response  and  wide QRS secondary to chronic left bundle branch block.  The patient  complained of marked weakness, lethargy and fatigue over the last several  days; he also reports fevers which have been going on since this weekend,  with temperatures up to 103.7.  Despite feeling poor, he continued to work  outside and today, felt extremely weak.  He then decided to come to the  emergency room for further evaluation.  He denies substernal chest pain but  does report increased shortness of breath over the last three days on  exertion.  He also reports fever and chills as outlined above.  He reports  nausea but no frank vomiting.  In the ER, the patient is in atrial  fibrillation with rapid ventricular response and was given intravenous  Cardizem.  This was ineffective and then amiodarone and Lopressor were  started, however,  the patient became gradually more hypotensive and also  suddenly reverted into atrial flutter with a heart rate of 150 beats per  minute with 2:1 block; this appeared to be a type 1 flutter.  The patient  was then informed by Dr. Learta Codding that the only option for further  treatment was to proceed with synchronized electrical cardioversion.  The  risks and benefits of the procedure were explained to the patient -- his  wife was also notified about this -- and he agreed to proceed with this.  Anesthesia was called and due to the fact that the patient had some water  just prior to cardioversion, decision was made to intubate the patient for  airway protection prior to cardioversion.  Subsequently, 100 joules were  delivered in a synchronized fashion with the patches placed anterior and  posterior.  The patient reverted briefly to normal sinus rhythm as well as a  junctional rhythm.  He initially was normotensive; he then quickly reverted  back again to atrial fibrillation and required a second countershock of 100  joules.  In the interim, the patient was also given Versed and  fentanyl for  sedation.  After his second cardioversion, he became more hypotensive and IV  pressors with norepinephrine had to be instituted.  The patient also  remained profoundly diaphoretic and temperature was recorded at 101.5.  All  appropriate blood cultures were done but due to the fact that the patient  has an aortic valve replaced, there was a concern of bacterial endocarditis.  While the patient was intubated on mechanical ventilation, decision was made  to proceed with transesophageal echocardiography to rule out prosthetic  valve endocarditis.  Although there was severe biatrial enlargement and  severe spaces in both atria, there did not appear to be any gross or  significant vegetations.  The aortic valve demonstrated a very mild  perivalvular leak and there was mild mitral regurgitation.  There was  moderate LV dysfunction and the right ventricle appeared to be enlarged and  hypokinetic.  There was no obvious thrombus in the left atrial appendage,  although this was difficult to visualize.  Subsequently, the patient was  transferred to the CCU after a central line was placed in the right femoral  vein and an A line was placed in the radial artery by respiratory therapy.  Eventually, the patient's blood pressure stabilized after he was given  several units of intravenous Hespan.  The patient is currently in sinus  bradycardia with a  blood pressure of 110/60.  All the procedures were  carefully explained to the patient's wife.   MEDICATIONS:  1. Toprol-XL 20 mg p.o. q.d.  2. Micardis 40 mg p.o. q.d.  3. Lanoxin 0.125 mg p.o. q.d.  4. Warfarin.   PAST MEDICAL HISTORY:  1. History of paroxysmal atrial fibrillation and flutter, status post     radiofrequency ablation in February of 2002 by Dr. Ladona Ridgel.  2. History of chronic left bundle branch block.  3. History of aortic valve replacement secondary to severe aortic    insufficiency (probable bicuspid valve) with #25 St.  Jude on May 03, 1991.  4. History of chronic Coumadin therapy.  5. History of congestive heart failure secondary to recurrent atrial     fibrillation on October 15, 2000.  6. History of chronic renal insufficiency.  7. History of mild perivalvular aortic leak.   ALLERGIES:  PENICILLIN.   SOCIAL HISTORY:  The patient is married.  His wife is a Engineer, civil (consulting).  He does not  drink.  He does not consume tobacco.   FAMILY HISTORY:  Father died of a heart attack at age 1.  Mother died at  the age of 42 of lung disease.  He has two brothers with hypertension.   REVIEW OF SYSTEMS:  Please refer to the history of present illness.  There  is no abdominal pain, no melena or hematochezia.  Denies dysuria and  frequency.  Fever and chills as outlined above.   PHYSICAL EXAMINATION:  VITAL SIGNS:  Blood pressure initially 68/36 in the  emergency room, now 128/67 and a heart rate of 45 to 50 beats per minute in  sinus bradycardia.  Temperature 101.5.  GENERAL:  A white male in moderate distress in the emergency room, pale and  diaphoretic.  HEENT:  Pupils isocoric.  Conjunctivae clear.  NECK:  No JVD or abdominojugular reflux.  No carotid bruits.  LUNGS:  Coarse rhonchi but otherwise clear.  No wheezing.  HEART:  Very distant heart sounds but regular rate and rhythm.  Normal  closing click of S1 and normal S2.  No obvious S3.  There were no obvious  murmurs but auscultation was very difficult.  ABDOMEN:  Abdomen was soft and nontender with no rebound or guarding and  good bowel sounds.  EXTREMITIES:  Peripheral pulses were palpable.  There was no cyanosis or  clubbing.  NEUROLOGIC:  The patient was alert and oriented and grossly nonfocal prior  to intubation and mechanical ventilation.   LABORATORY AND ACCESSORY CLINICAL DATA:  Twelve-lead electrocardiogram:  Atrial fibrillation with rapid ventricular rate, left bundle branch block.   Chest x-ray:  Mild cardiomegaly.  First x-ray did not  demonstrate congestive  heart failure.  Second x-ray was notable for increased interstitial  markings.   Labs:  White count was 14.7, hemoglobin was 18.1, hematocrit 53.1, MCV 92,  platelet count 163,000.  Sodium 134, potassium 4.5, chloride 99, CO2 22,  glucose 169, BUN 29, creatinine is 1.8, total bilirubin is 1.9, alkaline  phosphatase 49, SGOT 37, SGPT 16.  CK is 302, CK-MB is 1.5, troponin is  0.11.  INR is 1.7, PTT is 41.  UA:  Specific gravity 1.026, pH 6, large  amount of protein, large amount of blood, negative for nitrate and trace  leukocyte.  D-dimer is 1.09 and magnesium level is 2.1.   ASSESSMENT:  1. Incessant atrial tachyarrhythmias with atrial flutter and atrial     fibrillation, status post direct-current cardioversion with restoration     of normal sinus rhythm.  2. Hypothyroidism and shock secondary to sepsis and #1.  3. Rule out pneumonia. 4. Rule out bacterial endocarditis in setting of fever with aortic valve     replacement.  No definite vegetations by transesophageal     echocardiography.  5. Status post aortic valve replacement in 1992 secondary to severe aortic     insufficiency, status post #12 St. Jude bileaflet prosthesis.  6. Chronic anticoagulation for #5.  7. History of radiofrequency ablation for atrial flutter, February of 2002.  8. Chronic left bundle branch block.  9. History of congestive heart failure secondary to #1, known mild left     ventricular systolic dysfunction and right ventricular enlargement.  10.      Mild chronic renal insufficiency.   PLAN:  Hypotension appears to be secondary to sepsis.  The source of the  infection is not clear but could either be lung or endocarditis.  Hemodynamic support is provided with intravenous fluids and Hespan as well  as IV pressors with norepinephrine, avoiding dopamine or other beta  agonists.  Empiric antibiotic therapy has been started, both for bacterial  endocarditis and pneumonia, with IV  vancomycin, gentamicin and ceftriaxone;  further dosing will be per pharmacy.  The patient will stay overnight on  mechanical ventilation.  If there is no evidence clinically and  radiologically for congestive heart failure in the a.m. and the patient has  a normal CK, blood pressure and static compliance, we can probably proceed  with extubation in the morning.  Atrial tachyarrhythmias have now settled  down with intravenous amiodarone, which should be continued at 0.5 mg/min.  If the patient becomes excessively bradycardic with heart rates below 40 to  45 beats per minute, we will discontinue this.  However, I would be  aggressive with maintaining him as a high risk of reverting back to atrial  flutter/atrial fibrillation.  Anticoagulation  with heparin will be continued and mild sedation provided overnight.  I have  explained the situation to the patient's wife and she understands his  condition is critical.  However, once he has defervesced and infection is  treated and we can maintain him in normal sinus rhythm, I do anticipate  possible extubation in the morning.                                                Learta Codding, M.D. [facility]    GED/MEDQ  D:  04/13/2002  T:  04/13/2002  Job:  [id number]   cc:   Doylene Canning. Ladona Ridgel, M.D. [facility]   Maisie Fus D. Riley Kill, M.D. [facility]   Madolyn Frieze. Jens Som, M.D. [facility]

## 2011-01-10 NOTE — Assessment & Plan Note (Signed)
[facility]                           ELECTROPHYSIOLOGY OFFICE NOTE   NAME:[name], [name]                       MRN:          [id number]  DATE:06/09/2006                            DOB:          02-Jun-1932    [name] returns today for followup.  He is a very pleasant middle-aged  man with multiple medical problems, including congestive heart failure,  atrial fibrillation with a rapid ventricular response contributing to a  cardiomyopathy, left bundle branch block, who is status post BiV ICD  implantation with lead revision in the past.  The patient had severe heart  failure with class 3-4 symptoms back in the springtime and ultimately  underwent revision of his defibrillator.  He has done well since then,  denying palpitations and shortness of breath once he has returned to sinus  rhythm.   His medications include Coumadin, Synthroid, potassium, aspirin, Toprol 50  twice daily, furosemide 40 daily, amiodarone 400 daily, and Benicar 40/12.5  daily.   PHYSICAL EXAMINATION:  GENERAL:  Notable for a pleasant, well-appearing  middle-aged man in no acute distress.  VITAL SIGNS:  Blood pressure 141/85, pulse 70 and regular, respirations 18.  The weight was 178 pounds.  NECK:  Jugular venous distention 7 cm.  LUNGS:  Clear bilaterally to auscultation.  There are no rales, rhonchi or  wheezes.  CARDIOVASCULAR:  Regular rate and rhythm with normal S1 and a mechanical  aortic valve closure sound.  EXTREMITIES:  No clubbing, cyanosis or edema.   Interrogation of his defibrillator demonstrates a Guidant Contact H170 with  no P, no R waves secondary to his underlying pacemaker/defibrillator  dependence.  The impedence was 679, the atrium 607 in the ventricle, and 550  in the left ventricle.  The threshold is 0.8 at 0.5 in the atrium, 1 at 0.5  in the right ventricle, and 1.4 at 0.8 in the left ventricle.  Today we  turned his outputs down from 4 at  0.8, to 3 at 0.8 in the left ventricle.  There were no intercurrent ISD therapies, and there was no A fib.   IMPRESSION:  1. Nonischemic cardiomyopathy.  2. Tachycardia-induced cardiomyopathy.  3. Congestive heart failure.  4. Left bundle branch block.  5. Atrial flutter, status post ablation.  6. Chronic amiodarone therapy.  7. Chronic Coumadin therapy.   DISCUSSION:  Overall, [name] is relatively stable.  Today, I have asked  that we repeat his 2D echo to see how his LV function has improved since he  was severely decompensated.  I have also recommended that we check a liver  function and thyroid function tests and pulmonary  function tests.  I plan to see him back in the office in several months.  In  addition, I have asked that he decrease his amiodarone from 400 to 300 mg  daily.            ______________________________  Doylene Canning. Ladona Ridgel, MD     GWT/MedQ  DD:  06/09/2006  DT:  06/10/2006  Job #:  [id number]   cc:   Stacie Glaze, MD

## 2011-01-16 ENCOUNTER — Encounter (INDEPENDENT_AMBULATORY_CARE_PROVIDER_SITE_OTHER): Payer: Medicare Other | Admitting: *Deleted

## 2011-01-16 DIAGNOSIS — I428 Other cardiomyopathies: Secondary | ICD-10-CM

## 2011-01-17 ENCOUNTER — Ambulatory Visit: Payer: Medicare Other

## 2011-01-17 ENCOUNTER — Encounter: Payer: Self-pay | Admitting: *Deleted

## 2011-01-17 DIAGNOSIS — Z7901 Long term (current) use of anticoagulants: Secondary | ICD-10-CM

## 2011-01-17 NOTE — Patient Instructions (Signed)
2mg . Every day (Same Dose)

## 2011-01-24 ENCOUNTER — Other Ambulatory Visit: Payer: Self-pay | Admitting: *Deleted

## 2011-01-24 MED ORDER — POTASSIUM CHLORIDE CRYS ER 20 MEQ PO TBCR: 20.0000 meq | EXTENDED_RELEASE_TABLET | Freq: Every day | ORAL | Status: DC

## 2011-02-17 ENCOUNTER — Ambulatory Visit (INDEPENDENT_AMBULATORY_CARE_PROVIDER_SITE_OTHER): Payer: Medicare Other

## 2011-02-17 DIAGNOSIS — Z7901 Long term (current) use of anticoagulants: Secondary | ICD-10-CM

## 2011-02-17 NOTE — Patient Instructions (Signed)
2mg. Every day (Same Dose)  

## 2011-03-19 ENCOUNTER — Ambulatory Visit (INDEPENDENT_AMBULATORY_CARE_PROVIDER_SITE_OTHER): Payer: Medicare Other | Admitting: Internal Medicine

## 2011-03-19 DIAGNOSIS — Z7901 Long term (current) use of anticoagulants: Secondary | ICD-10-CM

## 2011-03-19 LAB — POCT INR: INR: 2.2

## 2011-03-19 NOTE — Patient Instructions (Signed)
2mg. Every day (Same Dose) check in 4 weeks 

## 2011-04-09 ENCOUNTER — Ambulatory Visit (INDEPENDENT_AMBULATORY_CARE_PROVIDER_SITE_OTHER): Payer: Medicare Other | Admitting: Internal Medicine

## 2011-04-09 ENCOUNTER — Encounter: Payer: Self-pay | Admitting: Internal Medicine

## 2011-04-09 VITALS — BP 140/80 | HR 68 | Temp 98.2°F | Resp 16 | Ht 66.0 in | Wt 180.0 lb

## 2011-04-09 DIAGNOSIS — I10 Essential (primary) hypertension: Secondary | ICD-10-CM

## 2011-04-09 DIAGNOSIS — Z7901 Long term (current) use of anticoagulants: Secondary | ICD-10-CM

## 2011-04-09 DIAGNOSIS — I5022 Chronic systolic (congestive) heart failure: Secondary | ICD-10-CM

## 2011-04-09 DIAGNOSIS — E8881 Metabolic syndrome: Secondary | ICD-10-CM

## 2011-04-09 DIAGNOSIS — M1710 Unilateral primary osteoarthritis, unspecified knee: Secondary | ICD-10-CM

## 2011-04-09 DIAGNOSIS — I4891 Unspecified atrial fibrillation: Secondary | ICD-10-CM

## 2011-04-09 LAB — BASIC METABOLIC PANEL
BUN: 22 mg/dL (ref 6–23)
Chloride: 106 mEq/L (ref 96–112)
GFR: 58.98 mL/min — ABNORMAL LOW (ref >60.00)
Glucose, Bld: 160 mg/dL — ABNORMAL HIGH (ref 70–99)
Potassium: 3.6 mEq/L (ref 3.5–5.1)

## 2011-04-09 LAB — CBC WITH DIFFERENTIAL/PLATELET
Basophils Relative: 0.5 % (ref 0.0–3.0)
Eosinophils Relative: 9.2 % — ABNORMAL HIGH (ref 0.0–5.0)
Hemoglobin: 14.9 g/dL (ref 13.0–17.0)
MCV: 84.7 fl (ref 78.0–100.0)
Monocytes Absolute: 0.7 10*3/uL (ref 0.1–1.0)
Neutro Abs: 4.3 10*3/uL (ref 1.4–7.7)
Neutrophils Relative %: 59.4 % (ref 43.0–77.0)
RBC: 5.3 Mil/uL (ref 4.22–5.81)
WBC: 7.2 10*3/uL (ref 4.5–10.5)

## 2011-04-09 MED ORDER — METHYLPREDNISOLONE ACETATE 40 MG/ML IJ SUSP: 40.0000 mg | Freq: Once | INTRAMUSCULAR | Status: DC

## 2011-04-09 NOTE — Progress Notes (Signed)
  Subjective:    Patient ID: [name], male    DOB: [birth year], 77 y.o.   MRN: [id number]  HPI Has loss of balance after working in the yard... Fell into chair and had significant contusions. He states that he can step on a garden hose and fall. He cannot take quick movement without falling. No dizziness. No LOC of syncope The has severe DJD of the knee. He states that the injections with synvisc we did one year ago were effective but he has developed progressive mobility issues with his knee.  He believes that this is the etiology of his fall he has no evidence congestive heart failure his blood pressures well controlled and he is on Coumadin at a well-controlled INR he did not have excessive bruising from his fall   Review of Systems  Constitutional: Negative for fever and fatigue.  HENT: Negative for hearing loss, congestion, neck pain and postnasal drip.   Eyes: Negative for discharge, redness and visual disturbance.  Respiratory: Negative for cough, shortness of breath and wheezing.   Cardiovascular: Negative for leg swelling.  Gastrointestinal: Negative for abdominal pain, constipation and abdominal distention.  Genitourinary: Negative for urgency and frequency.  Musculoskeletal: Positive for back pain, joint swelling and gait problem. Negative for arthralgias.  Skin: Negative for color change and rash.  Neurological: Negative for weakness and light-headedness.       Unsteady gate  Hematological: Negative for adenopathy.  Psychiatric/Behavioral: Negative for behavioral problems.       Objective:   Physical Exam  Constitutional: He appears well-developed and well-nourished.  HENT:  Head: Normocephalic and atraumatic.  Eyes: Conjunctivae are normal. Pupils are equal, round, and reactive to light.  Neck: Normal range of motion. Neck supple.  Cardiovascular: Normal rate and regular rhythm.   Pulmonary/Chest: Effort normal and breath sounds normal.  Abdominal: Soft. Bowel  sounds are normal.  Musculoskeletal:       degerative changes to knees right greater that left          Assessment & Plan:  Severe DJD of the right knee with falls  Informed consent obtained and the patient'sright knee was prepped Betadine local anesthesia obtained with topical spray 40 mg of Depo-Medrol and 1/2 cc of lidocaine was injected into the joint space the patient tolerated the injection well post injection care discussed with patient ice placed   The patient's hypertension is stable his current medications , his congestive heart failure is also stable.  His thyroid will be monitored today he has no signs of current congestive heart  He is stable on his metoprolol furosemide and potassium potassium will be monitored today

## 2011-04-17 ENCOUNTER — Encounter: Payer: Self-pay | Admitting: Internal Medicine

## 2011-04-17 ENCOUNTER — Ambulatory Visit (INDEPENDENT_AMBULATORY_CARE_PROVIDER_SITE_OTHER): Payer: Medicare Other | Admitting: *Deleted

## 2011-04-17 ENCOUNTER — Other Ambulatory Visit: Payer: Self-pay | Admitting: Internal Medicine

## 2011-04-17 DIAGNOSIS — I428 Other cardiomyopathies: Secondary | ICD-10-CM

## 2011-04-24 LAB — REMOTE ICD DEVICE
AL AMPLITUDE: 2.4 mv
AL IMPEDENCE ICD: 560 Ohm
ATRIAL PACING ICD: 0 pct
CHARGE TIME: 8.7 s
RV LEAD AMPLITUDE: 25 mv
TZAT-0001FASTVT: 2
TZAT-0002FASTVT: NEGATIVE
TZAT-0013FASTVT: 1
TZAT-0013SLOWVT: 2
TZAT-0018FASTVT: NEGATIVE
TZAT-0018FASTVT: NEGATIVE
TZAT-0018SLOWVT: NEGATIVE
TZST-0001FASTVT: 4
TZST-0001FASTVT: 5
TZST-0001SLOWVT: 3
TZST-0001SLOWVT: 4
TZST-0001SLOWVT: 7
TZST-0003FASTVT: 23 J
TZST-0003FASTVT: 31 J
TZST-0003FASTVT: 41 J
TZST-0003FASTVT: 41 J
TZST-0003SLOWVT: 21 J
TZST-0003SLOWVT: 41 J
TZST-0003SLOWVT: 41 J
VENTRICULAR PACING ICD: 100 pct

## 2011-05-06 NOTE — Progress Notes (Signed)
ICD was checked by remote  

## 2011-05-07 ENCOUNTER — Encounter: Payer: Self-pay | Admitting: *Deleted

## 2011-05-14 ENCOUNTER — Ambulatory Visit (INDEPENDENT_AMBULATORY_CARE_PROVIDER_SITE_OTHER): Payer: Medicare Other | Admitting: Internal Medicine

## 2011-05-14 DIAGNOSIS — Z7901 Long term (current) use of anticoagulants: Secondary | ICD-10-CM

## 2011-05-14 LAB — POCT INR: INR: 1.9

## 2011-05-14 NOTE — Patient Instructions (Signed)
2mg . Every day (Same Dose) check in 4 weeks

## 2011-05-16 ENCOUNTER — Other Ambulatory Visit: Payer: Self-pay | Admitting: Internal Medicine

## 2011-06-11 ENCOUNTER — Ambulatory Visit (INDEPENDENT_AMBULATORY_CARE_PROVIDER_SITE_OTHER): Payer: Medicare Other | Admitting: Internal Medicine

## 2011-06-11 DIAGNOSIS — I4891 Unspecified atrial fibrillation: Secondary | ICD-10-CM

## 2011-06-11 DIAGNOSIS — Z7901 Long term (current) use of anticoagulants: Secondary | ICD-10-CM

## 2011-06-25 ENCOUNTER — Other Ambulatory Visit: Payer: Self-pay | Admitting: Internal Medicine

## 2011-07-07 ENCOUNTER — Other Ambulatory Visit: Payer: Self-pay | Admitting: *Deleted

## 2011-07-07 MED ORDER — RAMIPRIL 10 MG PO CAPS: 10.0000 mg | ORAL_CAPSULE | Freq: Every day | ORAL | Status: DC

## 2011-07-09 ENCOUNTER — Ambulatory Visit (INDEPENDENT_AMBULATORY_CARE_PROVIDER_SITE_OTHER): Payer: Medicare Other | Admitting: Internal Medicine

## 2011-07-09 ENCOUNTER — Encounter: Payer: Self-pay | Admitting: Internal Medicine

## 2011-07-09 VITALS — BP 155/90 | HR 80 | Temp 98.2°F | Resp 16 | Ht 66.0 in | Wt 181.0 lb

## 2011-07-09 DIAGNOSIS — I4891 Unspecified atrial fibrillation: Secondary | ICD-10-CM

## 2011-07-09 DIAGNOSIS — E8881 Metabolic syndrome: Secondary | ICD-10-CM

## 2011-07-09 DIAGNOSIS — Z7901 Long term (current) use of anticoagulants: Secondary | ICD-10-CM

## 2011-07-09 DIAGNOSIS — N189 Chronic kidney disease, unspecified: Secondary | ICD-10-CM

## 2011-07-09 DIAGNOSIS — E039 Hypothyroidism, unspecified: Secondary | ICD-10-CM

## 2011-07-09 DIAGNOSIS — I10 Essential (primary) hypertension: Secondary | ICD-10-CM

## 2011-07-09 DIAGNOSIS — Z79899 Other long term (current) drug therapy: Secondary | ICD-10-CM

## 2011-07-09 LAB — BASIC METABOLIC PANEL
BUN: 20 mg/dL (ref 6–23)
Calcium: 9 mg/dL (ref 8.4–10.5)
GFR: 64.2 mL/min (ref >60.00)
Glucose, Bld: 110 mg/dL — ABNORMAL HIGH (ref 70–99)
Potassium: 4 mEq/L (ref 3.5–5.1)

## 2011-07-09 NOTE — Patient Instructions (Addendum)
  Latest dosing instructions   Total Sun Mon Tue Wed Thu Fri Sat   14 2 mg 2 mg 2 mg 2 mg 2 mg 2 mg 2 mg    (2 mg1) (2 mg1) (2 mg1) (2 mg1) (2 mg1) (2 mg1) (2 mg1)        The patient is instructed to continue all medications as prescribed. Schedule followup with check out clerk upon leaving the clinic eliminate sweets, sweet tea, soft drinks.Marland KitchenMarland KitchenMarland Kitchen

## 2011-07-09 NOTE — Progress Notes (Signed)
Subjective:    Patient ID: [name], male    DOB: [birth year], 77 y.o.   MRN: [id number]  HPI Elevations in cbgs worrisome for DM He has hx of AF,HTN hyperlipidemia and dysmetabolic syndrome He admits to poor diet He has gained weight "has a sweet tooth" We reviewed risks of DM and the complications of these drugs with his other medications   Review of Systems  Constitutional: Negative for fever and fatigue.  HENT: Negative for hearing loss, congestion, neck pain and postnasal drip.   Eyes: Negative for discharge, redness and visual disturbance.  Respiratory: Negative for cough, shortness of breath and wheezing.   Cardiovascular: Negative for leg swelling.  Gastrointestinal: Negative for abdominal pain, constipation and abdominal distention.  Genitourinary: Negative for urgency and frequency.  Musculoskeletal: Negative for joint swelling and arthralgias.  Skin: Negative for color change and rash.  Neurological: Negative for weakness and light-headedness.  Hematological: Negative for adenopathy.  Psychiatric/Behavioral: Negative for behavioral problems.   Past Medical History  Diagnosis Date  . Cardiomyopathy, nonischemic   . Tachycardia induced cardiomyopathy   . LBBB (left bundle branch block)   . CHF (congestive heart failure)   . S/P ablation of atrial fibrillation   . Encounter for long-term (current) use of anticoagulants   . Hypertension   . Thyroid disease   . S/P AVR (aortic valve replacement) and aortoplasty   . Defibrillator activation   . Hyperlipidemia     History   Social History  . Marital Status: Married    Spouse Name: N/A    Number of Children: N/A  . Years of Education: N/A   Occupational History  . Not on file.   Social History Main Topics  . Smoking status: Former Smoker    Quit date: 08/25/1968  . Smokeless tobacco: Not on file  . Alcohol Use: No  . Drug Use: No  . Sexually Active: Yes   Other Topics Concern  . Not on file    Social History Narrative  . No narrative on file    Past Surgical History  Procedure Date  . Aortic valve replacement   . Coronary artery bypass graft   . Pacmaker,defibr 2006    Family History  Problem Relation Age of Onset  . Heart disease Mother     Allergies  Allergen Reactions  . Amoxicillin     REACTION: unspecified  . Ezetimibe-Simvastatin     REACTION: unspecified  . Penicillins   . Sulfamethoxazole W/Trimethoprim     REACTION: nausia    Current Outpatient Prescriptions on File Prior to Visit  Medication Sig Dispense Refill  . aspirin 81 MG tablet Take 81 mg by mouth daily.        . calcium carbonate (OS-CAL) 600 MG TABS Take 600 mg by mouth 2 (two) times daily with a meal.        . CRESTOR 10 MG tablet TAKE 1 TABLET ONCE DAILY.  30 each  11  . metoprolol (LOPRESSOR) 50 MG tablet Take 75 mg by mouth 2 (two) times daily.        . ramipril (ALTACE) 10 MG capsule Take 1 capsule (10 mg total) by mouth daily.  30 capsule  11  . SYNTHROID 100 MCG tablet TAKE 1 TABLET ONCE DAILY.  90 each  3    BP 155/90  Pulse 80  Temp 98.2 F (36.8 C)  Resp 16  Ht 5\' 6"  (1.676 m)  Wt 181 lb (82.101 kg)  BMI 29.21  kg/m2       Objective:   Physical Exam  Nursing note and vitals reviewed. Constitutional: He appears well-developed and well-nourished.  HENT:  Head: Normocephalic and atraumatic.  Eyes: Conjunctivae are normal. Pupils are equal, round, and reactive to light.  Neck: Normal range of motion. Neck supple.  Cardiovascular: Normal rate and regular rhythm.   Pulmonary/Chest: Effort normal and breath sounds normal.  Abdominal: Soft. Bowel sounds are normal.          Assessment & Plan:  High risk patient with elevating a1c Attempt diet and weight loss control He is on an ace He will need an eye exam moniter renal function, next visit order a1c and microalbumin Consider oral agent if A1c persists above 7

## 2011-07-24 ENCOUNTER — Encounter: Payer: Self-pay | Admitting: Internal Medicine

## 2011-07-24 ENCOUNTER — Ambulatory Visit (INDEPENDENT_AMBULATORY_CARE_PROVIDER_SITE_OTHER): Payer: Medicare Other | Admitting: *Deleted

## 2011-07-24 ENCOUNTER — Other Ambulatory Visit: Payer: Self-pay | Admitting: Internal Medicine

## 2011-07-24 DIAGNOSIS — I428 Other cardiomyopathies: Secondary | ICD-10-CM

## 2011-07-24 DIAGNOSIS — Z95810 Presence of automatic (implantable) cardiac defibrillator: Secondary | ICD-10-CM

## 2011-07-24 DIAGNOSIS — I4891 Unspecified atrial fibrillation: Secondary | ICD-10-CM

## 2011-07-25 ENCOUNTER — Other Ambulatory Visit: Payer: Self-pay | Admitting: Internal Medicine

## 2011-07-25 LAB — REMOTE ICD DEVICE
AL AMPLITUDE: 1.7 mv
ATRIAL PACING ICD: 0 pct
ATRIAL PACING ICD: 0 pct
BRDY-0002RA: 80 {beats}/min
CHARGE TIME: 8.8 s
DEVICE MODEL ICD: 480118
DEVICE MODEL ICD: 480118
FVT: 0
FVT: 0
LV LEAD IMPEDENCE ICD: 492 Ohm
PACEART VT: 0
RV LEAD IMPEDENCE ICD: 523 Ohm
TOT-0006: 20120823000000
TOT-0006: 20120823000000
TZAT-0001FASTVT: 1
TZAT-0001FASTVT: 1
TZAT-0001FASTVT: 2
TZAT-0001SLOWVT: 1
TZAT-0001SLOWVT: 1
TZAT-0001SLOWVT: 2
TZAT-0013SLOWVT: 2
TZAT-0013SLOWVT: 2
TZAT-0018FASTVT: NEGATIVE
TZAT-0018FASTVT: NEGATIVE
TZAT-0018FASTVT: NEGATIVE
TZAT-0018SLOWVT: NEGATIVE
TZAT-0018SLOWVT: NEGATIVE
TZON-0003FASTVT: 300 ms
TZON-0003FASTVT: 300 ms
TZON-0003SLOWVT: 352.9 ms
TZON-0003SLOWVT: 352.9 ms
TZST-0001FASTVT: 3
TZST-0001FASTVT: 3
TZST-0001FASTVT: 6
TZST-0001FASTVT: 6
TZST-0001FASTVT: 7
TZST-0001FASTVT: 7
TZST-0001SLOWVT: 3
TZST-0001SLOWVT: 5
TZST-0001SLOWVT: 5
TZST-0001SLOWVT: 6
TZST-0001SLOWVT: 6
TZST-0003FASTVT: 23 J
TZST-0003FASTVT: 31 J
TZST-0003FASTVT: 41 J
TZST-0003FASTVT: 41 J
TZST-0003FASTVT: 41 J
TZST-0003SLOWVT: 21 J
TZST-0003SLOWVT: 31 J
TZST-0003SLOWVT: 41 J
VENTRICULAR PACING ICD: 99 pct
VENTRICULAR PACING ICD: 99 pct

## 2011-07-29 ENCOUNTER — Other Ambulatory Visit: Payer: Self-pay

## 2011-07-29 MED ORDER — FUROSEMIDE 40 MG PO TABS: 40.0000 mg | ORAL_TABLET | Freq: Every day | ORAL | Status: DC

## 2011-07-29 NOTE — Progress Notes (Signed)
Remote icd check  

## 2011-08-06 ENCOUNTER — Ambulatory Visit (INDEPENDENT_AMBULATORY_CARE_PROVIDER_SITE_OTHER): Payer: Medicare Other

## 2011-08-06 DIAGNOSIS — Z7901 Long term (current) use of anticoagulants: Secondary | ICD-10-CM

## 2011-08-06 LAB — POCT INR: INR: 1.9

## 2011-08-06 NOTE — Patient Instructions (Signed)
  Latest dosing instructions   Total Sun Mon Tue Wed Thu Fri Sat   14 2 mg 2 mg 2 mg 2 mg 2 mg 2 mg 2 mg    (2 mg1) (2 mg1) (2 mg1) (2 mg1) (2 mg1) (2 mg1) (2 mg1)        

## 2011-08-21 ENCOUNTER — Other Ambulatory Visit: Payer: Self-pay

## 2011-08-21 MED ORDER — POTASSIUM CHLORIDE CRYS ER 20 MEQ PO TBCR: 20.0000 meq | EXTENDED_RELEASE_TABLET | Freq: Every day | ORAL | Status: DC

## 2011-09-08 ENCOUNTER — Encounter: Payer: Self-pay | Admitting: Internal Medicine

## 2011-09-08 ENCOUNTER — Ambulatory Visit (INDEPENDENT_AMBULATORY_CARE_PROVIDER_SITE_OTHER): Payer: Medicare Other | Admitting: Internal Medicine

## 2011-09-08 VITALS — BP 130/80 | HR 76 | Temp 98.3°F | Resp 16 | Ht 66.0 in | Wt 178.0 lb

## 2011-09-08 DIAGNOSIS — M1710 Unilateral primary osteoarthritis, unspecified knee: Secondary | ICD-10-CM

## 2011-09-08 DIAGNOSIS — I4891 Unspecified atrial fibrillation: Secondary | ICD-10-CM

## 2011-09-08 DIAGNOSIS — Z7901 Long term (current) use of anticoagulants: Secondary | ICD-10-CM

## 2011-09-08 DIAGNOSIS — R7309 Other abnormal glucose: Secondary | ICD-10-CM

## 2011-09-08 DIAGNOSIS — I10 Essential (primary) hypertension: Secondary | ICD-10-CM

## 2011-09-08 LAB — POCT INR: INR: 2.1

## 2011-09-08 LAB — BASIC METABOLIC PANEL
BUN: 18 mg/dL (ref 6–23)
Chloride: 98 mEq/L (ref 96–112)
Potassium: 3.8 mEq/L (ref 3.5–5.1)
Sodium: 141 mEq/L (ref 135–145)

## 2011-09-08 MED ORDER — METHYLPREDNISOLONE ACETATE PF 40 MG/ML IJ SUSP: 40.0000 mg | Freq: Once | INTRAMUSCULAR | Status: DC

## 2011-09-08 MED ORDER — WARFARIN SODIUM 2 MG PO TABS: 2.0000 mg | ORAL_TABLET | Freq: Every day | ORAL | Status: DC

## 2011-09-08 NOTE — Progress Notes (Signed)
Addended by: Bonnye Fava on: 09/08/2011 12:42 PM   Modules accepted: Orders

## 2011-09-08 NOTE — Patient Instructions (Addendum)
The patient is instructed to continue all medications as prescribed. Schedule followup with check out clerk upon leaving the clinic You have received a steroid injection into a joint space. It will take up to 48 hours before you notice a difference in the pain in the joint. For the next few hours keep ice on the site of the injection. Do not exert the injected joint for the next 24 hours.    Latest dosing instructions   Total Sun Mon Tue Wed Thu Fri Sat   14 2 mg 2 mg 2 mg 2 mg 2 mg 2 mg 2 mg    (2 mg1) (2 mg1) (2 mg1) (2 mg1) (2 mg1) (2 mg1) (2 mg1)

## 2011-09-08 NOTE — Progress Notes (Signed)
Subjective:    Patient ID: [name], male    DOB: 1931/09/26, 78 y.o.   MRN: [id number]  HPI  The pt has severe DJD of right knee that he wants to continue to "put off" He has lost weight and stopped eating sugar and has loset weight and with good change in blod pressure as well. He remains on coumadin blood pressure is stable The patient denies any abnormal shortness of breath swelling of his legs or exercise intolerance at this point.  He is due for a pro time today for Coumadin management he is taking 2 mg   Review of Systems  Constitutional: Negative for fever and fatigue.  HENT: Negative for hearing loss, congestion, neck pain and postnasal drip.   Eyes: Negative for discharge, redness and visual disturbance.  Respiratory: Negative for cough, shortness of breath and wheezing.   Cardiovascular: Negative for leg swelling.  Gastrointestinal: Negative for abdominal pain, constipation and abdominal distention.  Genitourinary: Negative for urgency and frequency.  Musculoskeletal: Negative for joint swelling and arthralgias.  Skin: Negative for color change and rash.  Neurological: Negative for weakness and light-headedness.  Hematological: Negative for adenopathy.  Psychiatric/Behavioral: Negative for behavioral problems.   Past Medical History  Diagnosis Date  . Cardiomyopathy, nonischemic   . Tachycardia induced cardiomyopathy   . LBBB (left bundle branch block)   . CHF (congestive heart failure)   . S/P ablation of atrial fibrillation   . Encounter for long-term (current) use of anticoagulants   . Hypertension   . Thyroid disease   . S/P AVR (aortic valve replacement) and aortoplasty   . Defibrillator activation   . Hyperlipidemia     History   Social History  . Marital Status: Married    Spouse Name: N/A    Number of Children: N/A  . Years of Education: N/A   Occupational History  . Not on file.   Social History Main Topics  . Smoking status: Former  Smoker    Quit date: 08/25/1968  . Smokeless tobacco: Not on file  . Alcohol Use: No  . Drug Use: No  . Sexually Active: Yes   Other Topics Concern  . Not on file   Social History Narrative  . No narrative on file    Past Surgical History  Procedure Date  . Aortic valve replacement   . Coronary artery bypass graft   . Pacmaker,defibr 2006    Family History  Problem Relation Age of Onset  . Heart disease Mother     Allergies  Allergen Reactions  . Amoxicillin     REACTION: unspecified  . Ezetimibe-Simvastatin     REACTION: unspecified  . Penicillins   . Sulfamethoxazole W/Trimethoprim     REACTION: nausia    Current Outpatient Prescriptions on File Prior to Visit  Medication Sig Dispense Refill  . aspirin 81 MG tablet Take 81 mg by mouth daily.        . calcium carbonate (OS-CAL) 600 MG TABS Take 600 mg by mouth 2 (two) times daily with a meal.        . COUMADIN 2 MG tablet TAKE 2 TABLETS (4MG ) ON TUES AND FRI, AND 1 TABLET (2MG ) ON ALL OTHERDAYS.  60 each  11  . CRESTOR 10 MG tablet TAKE 1 TABLET ONCE DAILY.  30 each  11  . furosemide (LASIX) 40 MG tablet Take 1 tablet (40 mg total) by mouth daily.  30 tablet  2  . metoprolol (LOPRESSOR) 50 MG  tablet Take 75 mg by mouth 2 (two) times daily.        . potassium chloride SA (K-DUR,KLOR-CON) 20 MEQ tablet Take 1 tablet (20 mEq total) by mouth daily.  30 tablet  2  . ramipril (ALTACE) 10 MG capsule Take 1 capsule (10 mg total) by mouth daily.  30 capsule  11  . SYNTHROID 100 MCG tablet TAKE 1 TABLET ONCE DAILY.  90 each  3    BP 130/80  Pulse 76  Temp 98.3 F (36.8 C)  Resp 16  Ht 5\' 6"  (1.676 m)  Wt 178 lb (80.74 kg)  BMI 28.73 kg/m2       Objective:   Physical Exam  Nursing note and vitals reviewed. Constitutional: He appears well-developed and well-nourished.  HENT:  Head: Normocephalic and atraumatic.  Eyes: Conjunctivae are normal. Pupils are equal, round, and reactive to light.  Neck: Normal range  of motion. Neck supple.  Cardiovascular: Normal rate and regular rhythm.   Pulmonary/Chest: Effort normal and breath sounds normal.  Abdominal: Soft. Bowel sounds are normal.          Assessment & Plan:  Monitor protime and adjust Coumadin as indicated continue Coumadin 2 mg daily.  He is on Crestor 10 mg by mouth daily and we measured his liver and cholesterol cholesterol was visit he is at goal.  His blood pressure stable his current medicines and his thyroid was stable per the last measurement in November.  We'll see him again in 4 months at which time we'll consider monitoring his thyroid.  Today will draw a hemoglobin A1c and a basic metabolic panel to measure his blood glucose he has given up soft drinks and candy and I believe will see a definite improvement.  Patient also has continued pain in his right knee he will eventually need a total knee replacement but today we will inject the knee with 40 mg of Depo-Medrol and half cc of lidocaine for pain control.    Informed consent obtained and the patient's knee was prepped with betadine. Local anesthesia was obtained with topical spray. Then 40 mg of Depo-Medrol and 1/2 cc of lidocaine was injected into the joint space. The patient tolerated the procedure without complications. Post injection care discussed with patient.

## 2011-10-09 ENCOUNTER — Ambulatory Visit (INDEPENDENT_AMBULATORY_CARE_PROVIDER_SITE_OTHER): Payer: Medicare Other | Admitting: Internal Medicine

## 2011-10-09 DIAGNOSIS — I4891 Unspecified atrial fibrillation: Secondary | ICD-10-CM

## 2011-10-09 NOTE — Patient Instructions (Signed)
  Latest dosing instructions   Total Sun Mon Tue Wed Thu Fri Sat   14 2 mg 2 mg 2 mg 2 mg 2 mg 2 mg 2 mg    (2 mg1) (2 mg1) (2 mg1) (2 mg1) (2 mg1) (2 mg1) (2 mg1)        

## 2011-10-20 ENCOUNTER — Encounter: Payer: Self-pay | Admitting: Internal Medicine

## 2011-10-20 ENCOUNTER — Ambulatory Visit (INDEPENDENT_AMBULATORY_CARE_PROVIDER_SITE_OTHER): Payer: Medicare Other | Admitting: Internal Medicine

## 2011-10-20 DIAGNOSIS — I10 Essential (primary) hypertension: Secondary | ICD-10-CM

## 2011-10-20 DIAGNOSIS — Z95810 Presence of automatic (implantable) cardiac defibrillator: Secondary | ICD-10-CM

## 2011-10-20 DIAGNOSIS — I5022 Chronic systolic (congestive) heart failure: Secondary | ICD-10-CM

## 2011-10-20 LAB — ICD DEVICE OBSERVATION
AL AMPLITUDE: 0.9 mv
AL IMPEDENCE ICD: 622 Ohm
ATRIAL PACING ICD: 0 pct
DEVICE MODEL ICD: 480118
LV LEAD THRESHOLD: 1 V
RV LEAD THRESHOLD: 1 V
TZAT-0001FASTVT: 2
TZAT-0001SLOWVT: 2
TZAT-0002FASTVT: NEGATIVE
TZAT-0013FASTVT: 1
TZAT-0013SLOWVT: 2
TZAT-0018FASTVT: NEGATIVE
TZAT-0018SLOWVT: NEGATIVE
TZST-0001FASTVT: 4
TZST-0001FASTVT: 5
TZST-0001FASTVT: 8
TZST-0001SLOWVT: 3
TZST-0001SLOWVT: 4
TZST-0001SLOWVT: 7
TZST-0003FASTVT: 23 J
TZST-0003FASTVT: 41 J
TZST-0003FASTVT: 41 J
TZST-0003SLOWVT: 31 J
TZST-0003SLOWVT: 41 J
TZST-0003SLOWVT: 41 J
TZST-0003SLOWVT: 41 J
VENTRICULAR PACING ICD: 100 pct

## 2011-10-20 NOTE — Assessment & Plan Note (Signed)
His device is working normally. Today we turned down his base rate and increased his rate response.

## 2011-10-20 NOTE — Assessment & Plan Note (Signed)
His blood pressure is well controlled. Despite this, he admits to dietary indiscretion. Today we discussed the importance of reducing his sodium intake. He will continue his medical therapy.

## 2011-10-20 NOTE — Assessment & Plan Note (Signed)
His symptoms are currently class II. He will maintain a low-sodium diet.

## 2011-10-20 NOTE — Progress Notes (Signed)
HPI [name] returns today for followup. He is a very pleasant 78 year old man with a nonischemic cardiomyopathy, chronic atrial fibrillation, complete heart block, status post biventricular ICD implantation. He has a chronic systolic heart failure class II. He denies chest pain. He occasionally gets dizzy particularly when he stands up quickly. No syncope, and no recent ICD shock. Allergies  Allergen Reactions  . Amoxicillin     REACTION: unspecified  . Ezetimibe-Simvastatin     REACTION: unspecified  . Penicillins   . Sulfamethoxazole W/Trimethoprim     REACTION: nausia     Current Outpatient Prescriptions  Medication Sig Dispense Refill  . aspirin 81 MG tablet Take 81 mg by mouth daily.        . calcium carbonate (OS-CAL) 600 MG TABS Take 600 mg by mouth 2 (two) times daily with a meal.        . CRESTOR 10 MG tablet TAKE 1 TABLET ONCE DAILY.  30 each  11  . furosemide (LASIX) 40 MG tablet Take 1 tablet (40 mg total) by mouth daily.  30 tablet  2  . potassium chloride SA (K-DUR,KLOR-CON) 20 MEQ tablet Take 1 tablet (20 mEq total) by mouth daily.  30 tablet  2  . ramipril (ALTACE) 10 MG capsule Take 1 capsule (10 mg total) by mouth daily.  30 capsule  11  . SYNTHROID 100 MCG tablet TAKE 1 TABLET ONCE DAILY.  90 each  3  . warfarin (COUMADIN) 2 MG tablet Take 1 tablet (2 mg total) by mouth daily.      . metoprolol (LOPRESSOR) 50 MG tablet Take 75 mg by mouth 2 (two) times daily.         Current Facility-Administered Medications  Medication Dose Route Frequency Provider Last Rate Last Dose  . methylPREDNISolone acetate PF (DEPO-MEDROL) injection 40 mg  40 mg Intra-articular Once Carrie Mew, MD         Past Medical History  Diagnosis Date  . Cardiomyopathy, nonischemic   . Tachycardia induced cardiomyopathy   . LBBB (left bundle branch block)   . CHF (congestive heart failure)   . S/P ablation of atrial fibrillation   . Encounter for long-term (current) use of  anticoagulants   . Hypertension   . Thyroid disease   . S/P AVR (aortic valve replacement) and aortoplasty   . Defibrillator activation   . Hyperlipidemia   . Chronic renal insufficiency   . Aortic valve disease     ROS:   All systems reviewed and negative except as noted in the HPI.   Past Surgical History  Procedure Date  . Aortic valve replacement   . Coronary artery bypass graft   . Pacmaker,defibr 2006  . Cardiac catheterization 12/04/04  . Aortic valve surgery 1992    [employer] Valve     Family History  Problem Relation Age of Onset  . Heart disease Mother   . Lung disease Mother   . Heart attack Father   . Stroke Brother   . Hypertension Brother      History   Social History  . Marital Status: Married    Spouse Name: N/A    Number of Children: N/A  . Years of Education: N/A   Occupational History  . Not on file.   Social History Main Topics  . Smoking status: Former Smoker    Quit date: 08/25/1968  . Smokeless tobacco: Not on file  . Alcohol Use: No  . Drug Use: No  .  Sexually Active: Yes   Other Topics Concern  . Not on file   Social History Narrative  . No narrative on file     BP 130/86  Pulse 76  Wt 78.98 kg (174 lb 1.9 oz)  Physical Exam:  Well appearing elderly man, NAD HEENT: Unremarkable Neck:  No JVD, no thyromegally Lungs:  Clear with no wheezes, rales, or rhonchi. Well-healed ICD incision. HEART:  Regular rate rhythm, no murmurs, no rubs, no clicks Abd:  soft, positive bowel sounds, no organomegally, no rebound, no guarding Ext:  2 plus pulses, no edema, no cyanosis, no clubbing Skin:  No rashes no nodules Neuro:  CN II through XII intact, motor grossly intact  DEVICE  Normal device function.  See PaceArt for details.   Assess/Plan:

## 2011-10-20 NOTE — Patient Instructions (Addendum)
Remote monitoring is used to monitor your Pacemaker of ICD from home. This monitoring reduces the number of office visits required to check your device to one time per year. It allows [place] to keep an eye on the functioning of your device to ensure it is working properly. You are scheduled for a device check from home on Jan 22, 2012. You may send your transmission at any time that day. If you have a wireless device, the transmission will be sent automatically. After your physician reviews your transmission, you will receive a postcard with your next transmission date.  Your physician wants you to follow-up in: 12 months with Dr Court Joy will receive a reminder letter in the mail two months in advance. If you don't receive a letter, please call our office to schedule the follow-up appointment.

## 2011-11-03 ENCOUNTER — Other Ambulatory Visit: Payer: Self-pay

## 2011-11-03 MED ORDER — FUROSEMIDE 40 MG PO TABS: 40.0000 mg | ORAL_TABLET | Freq: Every day | ORAL | Status: DC

## 2011-11-03 MED ORDER — METOPROLOL TARTRATE 50 MG PO TABS: 75.0000 mg | ORAL_TABLET | Freq: Two times a day (BID) | ORAL | Status: DC

## 2011-11-03 NOTE — Telephone Encounter (Signed)
..   Requested Prescriptions   Signed Prescriptions Disp Refills  . furosemide (LASIX) 40 MG tablet 30 tablet 11    Sig: Take 1 tablet (40 mg total) by mouth daily.    Authorizing Provider: Marinus Maw    Ordering User: Christella Hartigan, Cameryn Schum M  . metoprolol (LOPRESSOR) 50 MG tablet 90 tablet 12    Sig: Take 1.5 tablets (75 mg total) by mouth 2 (two) times daily.    Authorizing Provider: Marinus Maw    Ordering User: Christella Hartigan, Ketan Renz Judie Petit

## 2011-11-04 ENCOUNTER — Encounter: Payer: Self-pay | Admitting: Internal Medicine

## 2011-11-04 ENCOUNTER — Telehealth: Payer: Self-pay | Admitting: *Deleted

## 2011-11-04 ENCOUNTER — Ambulatory Visit (INDEPENDENT_AMBULATORY_CARE_PROVIDER_SITE_OTHER): Payer: Medicare Other | Admitting: Internal Medicine

## 2011-11-04 DIAGNOSIS — N189 Chronic kidney disease, unspecified: Secondary | ICD-10-CM

## 2011-11-04 DIAGNOSIS — I5022 Chronic systolic (congestive) heart failure: Secondary | ICD-10-CM

## 2011-11-04 DIAGNOSIS — I10 Essential (primary) hypertension: Secondary | ICD-10-CM

## 2011-11-04 DIAGNOSIS — I4891 Unspecified atrial fibrillation: Secondary | ICD-10-CM

## 2011-11-04 LAB — POCT INR: INR: 2

## 2011-11-04 NOTE — Progress Notes (Signed)
  Subjective:    Patient ID: [name], male    DOB: [birth year], 78 y.o.   MRN: [id number]  HPI  BP Readings from Last 3 Encounters:  11/04/11 142/98  10/20/11 130/86  09/08/11 130/80    Review of Systems     Objective:   Physical Exam        Assessment & Plan:

## 2011-11-04 NOTE — Telephone Encounter (Signed)
Per dr Nada Maclachlan padonda see him today

## 2011-11-04 NOTE — Patient Instructions (Addendum)
  Latest dosing instructions   Total Sun Mon Tue Wed Thu Fri Sat   14 2 mg 2 mg 2 mg 2 mg 2 mg 2 mg 2 mg    (2 mg1) (2 mg1) (2 mg1) (2 mg1) (2 mg1) (2 mg1) (2 mg1)       Limit your sodium (Salt) intake  Please check your blood pressure on a regular basis.  If it is consistently greater than 150/90, please make an office appointment. Return in one month for follow-up

## 2011-11-04 NOTE — Progress Notes (Signed)
  Subjective:    Patient ID: [name], male    DOB: 1932-05-08, 78 y.o.   MRN: [id number]  HPI  78 year old patient who has a history of atrial fibrillation treated hypertension chronic kidney disease status post ICD. For the past 3 days he has had elevated home blood pressure readings. Systolics have been as high as 190 and diastolics persistently greater than 100. After lunch today he had an essentially normal blood pressure. He generally feels well he has had a recent cardiology followup last month and is scheduled to see his primary care provider within the next 4 weeks    Review of Systems     Objective:   Physical Exam  Constitutional: He appears well-developed and well-nourished. No distress.       Blood pressure 130 to 140 systolic over 80-90 diastolic  Cardiovascular: Normal rate, regular rhythm and normal heart sounds.   Pulmonary/Chest: Effort normal and breath sounds normal. He has no rales.  Musculoskeletal: He exhibits no edema.          Assessment & Plan:   Hypertension. Blood pressure has been elevated the last 3 days but seems to be improving today. We'll continue to monitor home blood pressure readings he'll call if readings become elevated again and we'll consider up titration of metoprolol. He will followup with PCP in 3-4 weeks

## 2011-11-04 NOTE — Telephone Encounter (Signed)
Appt scheduled with Dr. K. 

## 2011-11-04 NOTE — Telephone Encounter (Signed)
Pt's wife called stating husband's BP has been elevated the past few days.  Yesterday it was 190/108, and today it has been 158/98.

## 2011-11-18 ENCOUNTER — Other Ambulatory Visit: Payer: Self-pay | Admitting: *Deleted

## 2011-11-18 MED ORDER — POTASSIUM CHLORIDE CRYS ER 20 MEQ PO TBCR: 20.0000 meq | EXTENDED_RELEASE_TABLET | Freq: Every day | ORAL | Status: DC

## 2011-12-08 ENCOUNTER — Encounter: Payer: Self-pay | Admitting: Internal Medicine

## 2011-12-08 ENCOUNTER — Ambulatory Visit (INDEPENDENT_AMBULATORY_CARE_PROVIDER_SITE_OTHER): Payer: Medicare Other | Admitting: Internal Medicine

## 2011-12-08 VITALS — BP 142/80 | HR 72 | Temp 98.6°F | Resp 16 | Ht 65.0 in | Wt 174.0 lb

## 2011-12-08 DIAGNOSIS — I4891 Unspecified atrial fibrillation: Secondary | ICD-10-CM

## 2011-12-08 DIAGNOSIS — M1710 Unilateral primary osteoarthritis, unspecified knee: Secondary | ICD-10-CM

## 2011-12-08 DIAGNOSIS — I10 Essential (primary) hypertension: Secondary | ICD-10-CM

## 2011-12-08 DIAGNOSIS — T50901A Poisoning by unspecified drugs, medicaments and biological substances, accidental (unintentional), initial encounter: Secondary | ICD-10-CM

## 2011-12-08 DIAGNOSIS — T50904A Poisoning by unspecified drugs, medicaments and biological substances, undetermined, initial encounter: Secondary | ICD-10-CM

## 2011-12-08 DIAGNOSIS — I482 Chronic atrial fibrillation, unspecified: Secondary | ICD-10-CM

## 2011-12-08 LAB — BASIC METABOLIC PANEL
BUN: 17 mg/dL (ref 6–23)
CO2: 27 mEq/L (ref 19–32)
Calcium: 9.2 mg/dL (ref 8.4–10.5)
GFR: 70.34 mL/min (ref >60.00)
Glucose, Bld: 136 mg/dL — ABNORMAL HIGH (ref 70–99)
Potassium: 4.1 mEq/L (ref 3.5–5.1)
Sodium: 140 mEq/L (ref 135–145)

## 2011-12-08 MED ORDER — METHYLPREDNISOLONE ACETATE 40 MG/ML IJ SUSP: 40.0000 mg | Freq: Once | INTRAMUSCULAR | Status: DC

## 2011-12-08 NOTE — Progress Notes (Signed)
Subjective:    Patient ID: [name], male    DOB: Oct 03, 1931, 78 y.o.   MRN: [id number]  HPI this is seen for followup of blood pressure his blood pressures improved.  He has a history of chronic atrial fibrillation has a history of systolic heart failure his history of renal insufficiency he is doing stable from the standpoint of those but had noticed gradual increase in his blood pressure part of this is related to weight and diet he has modified his diet significantly cutting out many of the carbohydrates and sugars and is following a weight loss program and has lost 3 pounds since that time his blood pressure has improved    Review of Systems  Constitutional: Negative for fever and fatigue.  HENT: Negative for hearing loss, congestion, neck pain and postnasal drip.   Eyes: Negative for discharge, redness and visual disturbance.  Respiratory: Positive for shortness of breath. Negative for cough and wheezing.   Cardiovascular: Positive for leg swelling.  Gastrointestinal: Negative for abdominal pain, constipation and abdominal distention.  Genitourinary: Negative for urgency and frequency.  Musculoskeletal: Positive for joint swelling and arthralgias.  Skin: Negative for color change and rash.  Neurological: Negative for weakness and light-headedness.  Hematological: Negative for adenopathy.  Psychiatric/Behavioral: Negative for behavioral problems.   Past Medical History  Diagnosis Date  . Cardiomyopathy, nonischemic   . Tachycardia induced cardiomyopathy   . LBBB (left bundle branch block)   . CHF (congestive heart failure)   . S/P ablation of atrial fibrillation   . Encounter for long-term (current) use of anticoagulants   . Hypertension   . Thyroid disease   . S/P AVR (aortic valve replacement) and aortoplasty   . Defibrillator activation   . Hyperlipidemia   . Chronic renal insufficiency   . Aortic valve disease     History   Social History  . Marital Status:  Married    Spouse Name: N/A    Number of Children: N/A  . Years of Education: N/A   Occupational History  . Not on file.   Social History Main Topics  . Smoking status: Former Smoker    Quit date: 08/25/1968  . Smokeless tobacco: Never Used  . Alcohol Use: No  . Drug Use: No  . Sexually Active: Yes   Other Topics Concern  . Not on file   Social History Narrative  . No narrative on file    Past Surgical History  Procedure Date  . Aortic valve replacement   . Coronary artery bypass graft   . Pacmaker,defibr 2006  . Cardiac catheterization 12/04/04  . Aortic valve surgery 1992    St. Jude Valve    Family History  Problem Relation Age of Onset  . Heart disease Mother   . Lung disease Mother   . Heart attack Father   . Stroke Brother   . Hypertension Brother     Allergies  Allergen Reactions  . Amoxicillin     REACTION: unspecified  . Ezetimibe-Simvastatin     REACTION: unspecified  . Penicillins   . Sulfamethoxazole W-Trimethoprim     REACTION: nausia    Current Outpatient Prescriptions on File Prior to Visit  Medication Sig Dispense Refill  . aspirin 81 MG tablet Take 81 mg by mouth daily.        . CRESTOR 10 MG tablet TAKE 1 TABLET ONCE DAILY.  30 each  11  . furosemide (LASIX) 40 MG tablet Take 1 tablet (40 mg total)  by mouth daily.  30 tablet  11  . metoprolol (LOPRESSOR) 50 MG tablet Take 1.5 tablets (75 mg total) by mouth 2 (two) times daily.  90 tablet  12  . ramipril (ALTACE) 10 MG capsule Take 1 capsule (10 mg total) by mouth daily.  30 capsule  11  . warfarin (COUMADIN) 2 MG tablet Take 1 tablet (2 mg total) by mouth daily.      . calcium carbonate (OS-CAL) 600 MG TABS Take 1 tablet (600 mg total) by mouth daily.  60 tablet    . levothyroxine (SYNTHROID) 100 MCG tablet Take 1 tablet (100 mcg total) by mouth daily.  90 tablet  3  . potassium chloride SA (K-DUR,KLOR-CON) 20 MEQ tablet Take 1 tablet (20 mEq total) by mouth daily.  30 tablet  2   No  current facility-administered medications on file prior to visit.    BP 142/80  Pulse 72  Temp 98.6 F (37 C)  Resp 16  Ht 5\' 5"  (1.651 m)  Wt 174 lb (78.926 kg)  BMI 28.96 kg/m2       Objective:   Physical Exam  Constitutional: He appears well-developed and well-nourished.  HENT:  Head: Normocephalic and atraumatic.  Eyes: Conjunctivae normal are normal. Pupils are equal, round, and reactive to light.  Neck: Normal range of motion. Neck supple.  Cardiovascular: Normal rate and regular rhythm.   Pulmonary/Chest: Effort normal and breath sounds normal.  Abdominal: Soft. Bowel sounds are normal.  Musculoskeletal: He exhibits edema and tenderness.          Assessment & Plan:  I think the elevated blood pressure that he was sensing was due to the irregular heartbeats when he is having lots of PACs the meter registers a higher blood pressure than his stable blood pressure.  I believe he is stable at this point we will measure his renal function today to make sure that it is stable as well as his potassium    Informed consent obtained and the patient'sright knee was prepped with betadine. Local anesthesia was obtained with topical spray. Then 40 mg of Depo-Medrol and 1/2 cc of lidocaine was injected into the joint space. The patient tolerated the procedure without complications. Post injection care discussed with patient.

## 2011-12-08 NOTE — Patient Instructions (Addendum)
The patient is instructed to continue all medications as prescribed. Schedule followup with check out clerk upon leaving the clinic     Latest dosing instructions   Total Sun Mon Tue Wed Thu Fri Sat   14 2 mg 2 mg 2 mg 2 mg 2 mg 2 mg 2 mg    (2 mg1) (2 mg1) (2 mg1) (2 mg1) (2 mg1) (2 mg1) (2 mg1)

## 2012-01-07 ENCOUNTER — Ambulatory Visit (INDEPENDENT_AMBULATORY_CARE_PROVIDER_SITE_OTHER): Payer: Medicare Other | Admitting: Family

## 2012-01-07 DIAGNOSIS — I4891 Unspecified atrial fibrillation: Secondary | ICD-10-CM

## 2012-01-07 NOTE — Patient Instructions (Signed)
  Latest dosing instructions   Total Sun Mon Tue Wed Thu Fri Sat   14 2 mg 2 mg 2 mg 2 mg 2 mg 2 mg 2 mg    (2 mg1) (2 mg1) (2 mg1) (2 mg1) (2 mg1) (2 mg1) (2 mg1)        

## 2012-01-22 ENCOUNTER — Encounter: Payer: Medicare Other | Admitting: *Deleted

## 2012-01-27 ENCOUNTER — Ambulatory Visit (INDEPENDENT_AMBULATORY_CARE_PROVIDER_SITE_OTHER): Payer: Medicare Other | Admitting: *Deleted

## 2012-01-27 ENCOUNTER — Encounter: Payer: Self-pay | Admitting: Internal Medicine

## 2012-01-27 DIAGNOSIS — Z95810 Presence of automatic (implantable) cardiac defibrillator: Secondary | ICD-10-CM

## 2012-01-27 DIAGNOSIS — I5022 Chronic systolic (congestive) heart failure: Secondary | ICD-10-CM

## 2012-01-27 LAB — REMOTE ICD DEVICE
ATRIAL PACING ICD: 0 pct
DEVICE MODEL ICD: 480118
FVT: 0
TOT-0006: 20130225000000
TZAT-0001FASTVT: 1
TZAT-0001SLOWVT: 1
TZAT-0001SLOWVT: 2
TZAT-0013SLOWVT: 2
TZAT-0018FASTVT: NEGATIVE
TZON-0003FASTVT: 300 ms
TZON-0003SLOWVT: 352.9 ms
TZST-0001FASTVT: 3
TZST-0001FASTVT: 6
TZST-0001FASTVT: 8
TZST-0001SLOWVT: 5
TZST-0001SLOWVT: 6
TZST-0003FASTVT: 41 J
TZST-0003FASTVT: 41 J
TZST-0003SLOWVT: 31 J
TZST-0003SLOWVT: 41 J
VENTRICULAR PACING ICD: 99 pct
VF: 0

## 2012-01-28 ENCOUNTER — Ambulatory Visit (INDEPENDENT_AMBULATORY_CARE_PROVIDER_SITE_OTHER): Payer: Medicare Other | Admitting: Family

## 2012-01-28 DIAGNOSIS — I4891 Unspecified atrial fibrillation: Secondary | ICD-10-CM

## 2012-01-28 NOTE — Patient Instructions (Signed)
  Latest dosing instructions   Total Sun Mon Tue Wed Thu Fri Sat   14 2 mg 2 mg 2 mg 2 mg 2 mg 2 mg 2 mg    (2 mg1) (2 mg1) (2 mg1) (2 mg1) (2 mg1) (2 mg1) (2 mg1)      Continue with 2 mg everyday. Check in 4 weeks.

## 2012-02-16 ENCOUNTER — Other Ambulatory Visit: Payer: Self-pay | Admitting: *Deleted

## 2012-02-16 ENCOUNTER — Encounter: Payer: Self-pay | Admitting: *Deleted

## 2012-02-16 MED ORDER — POTASSIUM CHLORIDE CRYS ER 20 MEQ PO TBCR: 20.0000 meq | EXTENDED_RELEASE_TABLET | Freq: Every day | ORAL | Status: DC

## 2012-02-17 ENCOUNTER — Encounter: Payer: Self-pay | Admitting: Internal Medicine

## 2012-02-17 ENCOUNTER — Telehealth: Payer: Self-pay | Admitting: Internal Medicine

## 2012-02-17 ENCOUNTER — Ambulatory Visit (INDEPENDENT_AMBULATORY_CARE_PROVIDER_SITE_OTHER): Payer: Medicare Other | Admitting: Internal Medicine

## 2012-02-17 VITALS — BP 118/80 | Temp 98.1°F | Wt 169.0 lb

## 2012-02-17 DIAGNOSIS — M549 Dorsalgia, unspecified: Secondary | ICD-10-CM

## 2012-02-17 DIAGNOSIS — N189 Chronic kidney disease, unspecified: Secondary | ICD-10-CM

## 2012-02-17 DIAGNOSIS — I4891 Unspecified atrial fibrillation: Secondary | ICD-10-CM

## 2012-02-17 DIAGNOSIS — I5022 Chronic systolic (congestive) heart failure: Secondary | ICD-10-CM

## 2012-02-17 DIAGNOSIS — I10 Essential (primary) hypertension: Secondary | ICD-10-CM

## 2012-02-17 MED ORDER — HYDROCODONE-ACETAMINOPHEN 5-500 MG PO TABS: 1.0000 | ORAL_TABLET | Freq: Three times a day (TID) | ORAL | Status: DC | PRN

## 2012-02-17 NOTE — Progress Notes (Signed)
  Subjective:    Patient ID: [name], male    DOB: [birth year], 78 y.o.   MRN: [id number]  HPI  78 year old patient who is in her medical problems including systolic heart failure this metabolic syndrome hypertension and Coumadin anticoagulation. He also has chronic kidney disease. He does have a history chronic intermittent low back pain and he presents with a chief complaint of lumbar discomfort for the past 3 days. Pain is located of lumbar region without radiation. He is cuff rest but pain is aggravated by movement. No systemic complaints    Review of Systems  Musculoskeletal: Positive for back pain.       Objective:   Physical Exam  Constitutional: He is oriented to person, place, and time. He appears well-developed and well-nourished. No distress.       Patient was able to stand from a sitting position and transferred to the examining table but appeared  to be uncomfortable with the transfer  HENT:  Head: Normocephalic.  Right Ear: External ear normal.  Left Ear: External ear normal.  Eyes: Conjunctivae and EOM are normal.  Abdominal: Soft. Bowel sounds are normal. He exhibits no distension. There is no tenderness.  Musculoskeletal: He exhibits no edema and no tenderness.       Straight leg testing didn't cause lumbar pain bilaterally there's no radiation of the pain  Decreased range of motion of both hips  Neurological: He is alert and oriented to person, place, and time.  Psychiatric: He has a normal mood and affect. His behavior is normal.          Assessment & Plan:   Flare of intermittent low back pain. In view of his multiple comorbidities we'll treat with rest and analgesics. Will avoid anti-inflammatories. Will call if unimproved

## 2012-02-17 NOTE — Telephone Encounter (Signed)
Appt made with Dr. Kirtland Bouchard 10:15 - pt aware

## 2012-02-17 NOTE — Patient Instructions (Signed)
Limit your sodium (Salt) intake  Most patients with low back pain will improve with time over the next two  weeks.  Keep active but avoid any activities that cause pain.  Apply moist heat to the low back area several times daily.  Use pain medications as directed  Call or return to clinic prn if these symptoms worsen or fail to improve as anticipated.

## 2012-02-17 NOTE — Telephone Encounter (Signed)
aller: Suzanne/Spouse; PCP: Darryll Capers; CB#: [phone]; ; ; Call regarding Back Pain THE PATIENT REFUSED 911; Pt is calling with severe back pain - since Saturday 11/14/11. Pt denies an injury. Pt has been seen for similar pain in the past which has been dx as a  kidney infection. Pt is not able to get out of bed now. Pt is having back pain on both sides of the back of his waist. RN advised appt and checked schedule. Dr. Lovell Sheehan and Orvan Falconer are full. RN sent office note to see if office will do a work in. OFFICE PLEASE CALL PT BACK AND ADVISE IF APPT OPENING.

## 2012-02-25 ENCOUNTER — Ambulatory Visit (INDEPENDENT_AMBULATORY_CARE_PROVIDER_SITE_OTHER): Payer: Medicare Other | Admitting: Family

## 2012-02-25 DIAGNOSIS — I4891 Unspecified atrial fibrillation: Secondary | ICD-10-CM

## 2012-02-25 LAB — POCT INR: INR: 2.4

## 2012-02-25 NOTE — Patient Instructions (Addendum)
Continue with 2 mg everyday. Check in 4 weeks.    Latest dosing instructions   Total Sun Mon Tue Wed Thu Fri Sat   14 2 mg 2 mg 2 mg 2 mg 2 mg 2 mg 2 mg    (2 mg1) (2 mg1) (2 mg1) (2 mg1) (2 mg1) (2 mg1) (2 mg1)

## 2012-03-08 ENCOUNTER — Ambulatory Visit (INDEPENDENT_AMBULATORY_CARE_PROVIDER_SITE_OTHER): Payer: Medicare Other | Admitting: Internal Medicine

## 2012-03-08 ENCOUNTER — Encounter: Payer: Self-pay | Admitting: Internal Medicine

## 2012-03-08 VITALS — BP 110/74 | HR 72 | Temp 98.2°F | Resp 16 | Ht 66.0 in | Wt 171.0 lb

## 2012-03-08 DIAGNOSIS — M25569 Pain in unspecified knee: Secondary | ICD-10-CM

## 2012-03-08 DIAGNOSIS — I129 Hypertensive chronic kidney disease with stage 1 through stage 4 chronic kidney disease, or unspecified chronic kidney disease: Secondary | ICD-10-CM

## 2012-03-08 DIAGNOSIS — N189 Chronic kidney disease, unspecified: Secondary | ICD-10-CM

## 2012-03-08 DIAGNOSIS — M549 Dorsalgia, unspecified: Secondary | ICD-10-CM

## 2012-03-08 LAB — BASIC METABOLIC PANEL
Calcium: 9.7 mg/dL (ref 8.4–10.5)
Creatinine, Ser: 1.3 mg/dL (ref 0.4–1.5)
GFR: 58.3 mL/min — ABNORMAL LOW (ref >60.00)
Glucose, Bld: 132 mg/dL — ABNORMAL HIGH (ref 70–99)
Sodium: 139 mEq/L (ref 135–145)

## 2012-03-08 MED ORDER — CALCIUM CARBONATE 600 MG PO TABS: 600.0000 mg | ORAL_TABLET | Freq: Every day | ORAL | Status: AC

## 2012-03-08 MED ORDER — HYDROCODONE-ACETAMINOPHEN 5-500 MG PO TABS: 1.0000 | ORAL_TABLET | Freq: Three times a day (TID) | ORAL | Status: AC | PRN

## 2012-03-08 NOTE — Progress Notes (Signed)
Subjective:    Patient ID: [name], male    DOB: 01-Nov-1931, 78 y.o.   MRN: [id number]  HPI patient is a 78 year old male followed for hypertension hyperlipidemia with a history of renal disease and was seen by my partner for low back pain.  He states that the low back pain responded to the medications that he was given and today we are going to make sure that he did not have any effect on his renal disease as well as his blood pressure which appears stable. I am concerned with his excessive use of nonsteroidals in the setting of renal disease.  Also I believe that they may affect that her mentally his hypertensive control his history of chronic systolic heart failure and atrial fibrillation.      Review of Systems  Constitutional: Negative.   HENT: Positive for congestion and tinnitus.   Eyes: Negative.   Respiratory: Negative.   Cardiovascular: Negative for chest pain, palpitations and leg swelling.  Genitourinary: Positive for urgency.  Musculoskeletal: Positive for myalgias, back pain and joint swelling.   Past Medical History  Diagnosis Date  . Cardiomyopathy, nonischemic   . Tachycardia induced cardiomyopathy   . LBBB (left bundle branch block)   . CHF (congestive heart failure)   . S/P ablation of atrial fibrillation   . Encounter for long-term (current) use of anticoagulants   . Hypertension   . Thyroid disease   . S/P AVR (aortic valve replacement) and aortoplasty   . Defibrillator activation   . Hyperlipidemia   . Chronic renal insufficiency   . Aortic valve disease     History   Social History  . Marital Status: Married    Spouse Name: N/A    Number of Children: N/A  . Years of Education: N/A   Occupational History  . Not on file.   Social History Main Topics  . Smoking status: Former Smoker    Quit date: 08/25/1968  . Smokeless tobacco: Never Used  . Alcohol Use: No  . Drug Use: No  . Sexually Active: Yes   Other Topics Concern  . Not on  file   Social History Narrative  . No narrative on file    Past Surgical History  Procedure Date  . Aortic valve replacement   . Coronary artery bypass graft   . Pacmaker,defibr 2006  . Cardiac catheterization 12/04/04  . Aortic valve surgery 1992    St. Jude Valve    Family History  Problem Relation Age of Onset  . Heart disease Mother   . Lung disease Mother   . Heart attack Father   . Stroke Brother   . Hypertension Brother     Allergies  Allergen Reactions  . Amoxicillin     REACTION: unspecified  . Ezetimibe-Simvastatin     REACTION: unspecified  . Penicillins   . Sulfamethoxazole W-Trimethoprim     REACTION: nausia    Current Outpatient Prescriptions on File Prior to Visit  Medication Sig Dispense Refill  . aspirin 81 MG tablet Take 81 mg by mouth daily.        . calcium carbonate (OS-CAL) 600 MG TABS Take 1 tablet (600 mg total) by mouth daily.  60 tablet    . CRESTOR 10 MG tablet TAKE 1 TABLET ONCE DAILY.  30 each  11  . furosemide (LASIX) 40 MG tablet Take 1 tablet (40 mg total) by mouth daily.  30 tablet  11  . metoprolol (LOPRESSOR) 50 MG tablet Take  1.5 tablets (75 mg total) by mouth 2 (two) times daily.  90 tablet  12  . potassium chloride SA (K-DUR,KLOR-CON) 20 MEQ tablet Take 1 tablet (20 mEq total) by mouth daily.  30 tablet  2  . ramipril (ALTACE) 10 MG capsule Take 1 capsule (10 mg total) by mouth daily.  30 capsule  11  . warfarin (COUMADIN) 2 MG tablet Take 1 tablet (2 mg total) by mouth daily.      Marland Kitchen levothyroxine (SYNTHROID) 100 MCG tablet Take 1 tablet (100 mcg total) by mouth daily.  90 tablet  3   Current Facility-Administered Medications on File Prior to Visit  Medication Dose Route Frequency Provider Last Rate Last Dose  . methylPREDNISolone acetate (DEPO-MEDROL) injection 40 mg  40 mg Intra-articular Once Stacie Glaze, MD        BP 110/74  Pulse 72  Temp 98.2 F (36.8 C)  Resp 16  Ht 5\' 6"  (1.676 m)  Wt 171 lb (77.565 kg)  BMI  27.60 kg/m2       Objective:   Physical Exam  Nursing note and vitals reviewed. Constitutional: He appears well-developed and well-nourished.  HENT:  Head: Normocephalic and atraumatic.  Eyes: Conjunctivae normal are normal. Pupils are equal, round, and reactive to light.  Neck: Normal range of motion. Neck supple.  Cardiovascular: Normal rate and regular rhythm.   Pulmonary/Chest: Effort normal and breath sounds normal.  Abdominal: Soft. Bowel sounds are normal.  Musculoskeletal: He exhibits tenderness.  Neurological: He displays normal reflexes.          Assessment & Plan:  Chronic arthritis with a history of hypertension atrial fibrillation and anticoagulation on Coumadin Recommend Ultram as a primary pain reliever rather than nonsteroidals both do to the above diagnoses and also 2 renal insufficiency measure a basic metabolic panel today to assess potassium creatinine and BUN The patient has an elevated creatinine Altace may have to be adjusted temporarily Avoidance of nonsteroidals discussed with patient

## 2012-03-08 NOTE — Patient Instructions (Addendum)
The patient is instructed to continue all medications as prescribed. Schedule followup with check out clerk upon leaving the clinic  

## 2012-03-26 ENCOUNTER — Ambulatory Visit (INDEPENDENT_AMBULATORY_CARE_PROVIDER_SITE_OTHER): Payer: Medicare Other | Admitting: Family

## 2012-03-26 DIAGNOSIS — I4891 Unspecified atrial fibrillation: Secondary | ICD-10-CM

## 2012-03-26 NOTE — Patient Instructions (Addendum)
Continue with 2 mg everyday. Check in 6 weeks.    Latest dosing instructions   Total Sun Mon Tue Wed Thu Fri Sat   14 2 mg 2 mg 2 mg 2 mg 2 mg 2 mg 2 mg    (2 mg1) (2 mg1) (2 mg1) (2 mg1) (2 mg1) (2 mg1) (2 mg1)        

## 2012-04-27 ENCOUNTER — Ambulatory Visit (INDEPENDENT_AMBULATORY_CARE_PROVIDER_SITE_OTHER): Payer: Medicare Other | Admitting: Family Medicine

## 2012-04-27 ENCOUNTER — Encounter: Payer: Self-pay | Admitting: Family Medicine

## 2012-04-27 ENCOUNTER — Telehealth: Payer: Self-pay | Admitting: Internal Medicine

## 2012-04-27 VITALS — BP 220/110 | HR 64 | Temp 99.8°F | Wt 164.0 lb

## 2012-04-27 DIAGNOSIS — L0390 Cellulitis, unspecified: Secondary | ICD-10-CM

## 2012-04-27 MED ORDER — CIPROFLOXACIN HCL 500 MG PO TABS: 500.0000 mg | ORAL_TABLET | Freq: Two times a day (BID) | ORAL | Status: DC

## 2012-04-27 MED ORDER — DOXYCYCLINE HYCLATE 100 MG PO CAPS: 100.0000 mg | ORAL_CAPSULE | Freq: Two times a day (BID) | ORAL | Status: DC

## 2012-04-27 MED ORDER — CEFTRIAXONE SODIUM 1 G IJ SOLR
1.0000 g | INTRAMUSCULAR | Status: AC
  Administered 2012-04-27: 1 g via INTRAMUSCULAR

## 2012-04-27 NOTE — Telephone Encounter (Signed)
Caller: Suzanne/Spouse; Patient Name: [name]; PCP: Darryll Capers (Adults only); Best Callback Phone Number: [phone]. Wife reports this gentleman developed redness, warmth and swelling of his right hand over the weekend. No known injury and skin is intact. Throbbing pain. Afebrile. [name] takes Coumadin. Caller would like an appointment to see MD. Jethro Bolus reports same occurence in his ankle a few weeks ago that resolved on it's own. Afebrile. Per Hand Non-Injury, New painful tightness or marked swelling in palm, wrist, hand or fingers, See in 4 hours. PCP is not in the office today and [facility] appts are for Coumadin checks only. Caller advised a note will be sent re an appt (Per Practice Profile) and she will get a callback.

## 2012-04-27 NOTE — Addendum Note (Signed)
Addended by: Kern Reap B on: 04/27/2012 03:41 PM   Modules accepted: Orders

## 2012-04-27 NOTE — Telephone Encounter (Signed)
Called pt's wife to make aware of appt today at 2:15 pm with Dr. Clent Ridges.

## 2012-04-27 NOTE — Progress Notes (Signed)
  Subjective:    Patient ID: MAZIN EMMA, male    DOB: 1932/01/13, 78 y.o.   MRN: [id number]  HPI Here for the sudden onset 4 days ago of swelling and pain in the right hand and wrist. No recent trauma, no skin breaks, no animal bites, etc. Now the swelling is spreading up the forearm to the elbow. He has a fever. Using Tylenol prn. Interestingly, he had an episode very similar to this in April 2010 in the left hand and forearm.    Review of Systems  Constitutional: Positive for fever.  Respiratory: Negative.   Cardiovascular: Negative.   Musculoskeletal: Positive for joint swelling and arthralgias.       Objective:   Physical Exam  Constitutional: He appears well-developed and well-nourished. No distress.  Cardiovascular: Normal rate, regular rhythm, normal heart sounds and intact distal pulses.   Pulmonary/Chest: Effort normal and breath sounds normal.       No adenopathy in the right axilla  Musculoskeletal:       His right hand and wrist are swollen, warm, red, and tender. There are a few red streaks extending up the right forearm.           Assessment & Plan:  Cellulitis of uncertain etiology. This does not appear to be a septic joint. The fever makes gout unlikely. Given Rocephin IM and started on Doxycycline and Cipro. Recheck this Friday.

## 2012-04-29 ENCOUNTER — Other Ambulatory Visit: Payer: Self-pay | Admitting: Internal Medicine

## 2012-04-29 ENCOUNTER — Ambulatory Visit (INDEPENDENT_AMBULATORY_CARE_PROVIDER_SITE_OTHER): Payer: Medicare Other | Admitting: *Deleted

## 2012-04-29 DIAGNOSIS — I5022 Chronic systolic (congestive) heart failure: Secondary | ICD-10-CM

## 2012-04-29 DIAGNOSIS — Z95810 Presence of automatic (implantable) cardiac defibrillator: Secondary | ICD-10-CM

## 2012-04-30 ENCOUNTER — Encounter: Payer: Self-pay | Admitting: Family Medicine

## 2012-04-30 ENCOUNTER — Ambulatory Visit (INDEPENDENT_AMBULATORY_CARE_PROVIDER_SITE_OTHER): Payer: Medicare Other | Admitting: Family Medicine

## 2012-04-30 VITALS — BP 134/84 | HR 70 | Temp 98.0°F | Wt 162.0 lb

## 2012-04-30 DIAGNOSIS — L0291 Cutaneous abscess, unspecified: Secondary | ICD-10-CM

## 2012-04-30 DIAGNOSIS — L0390 Cellulitis, unspecified: Secondary | ICD-10-CM

## 2012-04-30 MED ORDER — CEFTRIAXONE SODIUM 1 G IJ SOLR
1.0000 g | Freq: Once | INTRAMUSCULAR | Status: AC
  Administered 2012-04-30: 1 g via INTRAMUSCULAR

## 2012-04-30 NOTE — Addendum Note (Signed)
Addended by: Aniceto Boss A on: 04/30/2012 12:29 PM   Modules accepted: Orders

## 2012-04-30 NOTE — Progress Notes (Signed)
  Subjective:    Patient ID: [name], male    DOB: [birth year], 78 y.o.   MRN: [id number]  HPI Here to follow up cellulitis to the right hand and forearm. He was here several days ago and got a shot of Rocephin. He was started on Doxycycline and Cipro, and he is feeling better. He swelling is down and the pain is much better. No fever.    Review of Systems  Constitutional: Negative.   Respiratory: Negative.   Cardiovascular: Negative.   Skin: Positive for color change.       Objective:   Physical Exam  Constitutional: He appears well-developed and well-nourished.  Musculoskeletal:       The right hand is swollen but has improved quite a bit from the last visit. Less erythematous, much less tender.           Assessment & Plan:  The cellulitis is improving. Given another Rocephin shot today. Finish out the antibiotics and recheck prn.

## 2012-05-04 ENCOUNTER — Encounter: Payer: Medicare Other | Admitting: *Deleted

## 2012-05-07 ENCOUNTER — Ambulatory Visit (INDEPENDENT_AMBULATORY_CARE_PROVIDER_SITE_OTHER): Payer: Medicare Other | Admitting: Family

## 2012-05-07 DIAGNOSIS — I4891 Unspecified atrial fibrillation: Secondary | ICD-10-CM

## 2012-05-07 LAB — POCT INR: INR: 2.4

## 2012-05-07 NOTE — Patient Instructions (Addendum)
Continue with 2 mg everyday. Check in 6 weeks.    Latest dosing instructions   Total Sun Mon Tue Wed Thu Fri Sat   14 2 mg 2 mg 2 mg 2 mg 2 mg 2 mg 2 mg    (2 mg1) (2 mg1) (2 mg1) (2 mg1) (2 mg1) (2 mg1) (2 mg1)        

## 2012-05-11 ENCOUNTER — Other Ambulatory Visit: Payer: Self-pay | Admitting: *Deleted

## 2012-05-11 MED ORDER — LEVOTHYROXINE SODIUM 100 MCG PO TABS: 100.0000 ug | ORAL_TABLET | Freq: Every day | ORAL | Status: DC

## 2012-05-17 ENCOUNTER — Ambulatory Visit (INDEPENDENT_AMBULATORY_CARE_PROVIDER_SITE_OTHER): Payer: Medicare Other | Admitting: Family Medicine

## 2012-05-17 ENCOUNTER — Telehealth: Payer: Self-pay | Admitting: Internal Medicine

## 2012-05-17 ENCOUNTER — Encounter: Payer: Self-pay | Admitting: Family Medicine

## 2012-05-17 VITALS — BP 134/86 | HR 73 | Temp 98.5°F | Wt 162.0 lb

## 2012-05-17 DIAGNOSIS — L02519 Cutaneous abscess of unspecified hand: Secondary | ICD-10-CM

## 2012-05-17 DIAGNOSIS — L03119 Cellulitis of unspecified part of limb: Secondary | ICD-10-CM

## 2012-05-17 MED ORDER — CIPROFLOXACIN HCL 500 MG PO TABS: 500.0000 mg | ORAL_TABLET | Freq: Two times a day (BID) | ORAL | Status: AC

## 2012-05-17 MED ORDER — DOXYCYCLINE HYCLATE 100 MG PO CAPS: 100.0000 mg | ORAL_CAPSULE | Freq: Two times a day (BID) | ORAL | Status: AC

## 2012-05-17 NOTE — Progress Notes (Signed)
  Subjective:    Patient ID: [name], male    DOB: 11/28/1931, 78 y.o.   MRN: [id number]  HPI Here to follow up on cellulitis in the right hand. He had 2 Rocephin shots and took 10 days each of Cipro and Doxycycline. He finished these up about 10 days ago. His infection was clearing nicely, and he has had no pain or redness in the arm or elbow ever since the lest visit. His hand and wrist were much improved while on the antibiotics, but now these are getting more painful and swelling again. No fever.   Review of Systems  Constitutional: Negative.   Musculoskeletal: Positive for joint swelling and arthralgias.       Objective:   Physical Exam  Constitutional: He appears well-developed and well-nourished.  Musculoskeletal:       Mild swelling and tenderness in the right wrist and hand, the worst area is the base of the thumb. No warmth or erythema.           Assessment & Plan:  Partially treated cellulitis. We will get back on Cipro and Doxycycline for 30 days this time, and this should eradicate the infection.

## 2012-05-17 NOTE — Telephone Encounter (Signed)
Caller: Suzanne/Spouse; Patient Name: [name]; PCP: Darryll Capers; Best Callback Phone Number: [phone]; Reason for call:  Mosquito/Insect bite about a month ago on inner wrist- seen in office a few weeks ago and Diagnosed with Cellulitis of hand. Took 2 different Antibiotics for 10 days-  given 2 shots in the office. This weekend hand started with swelling and tenderness and is started to look red again- Onset 05/14/12. BP = 190/100 this morning 05/17/12. Afebrile. Wrist, knuckles hurting. Takes Coumaden daily. Triage and Care advice per Hand Non-Injury and Bites and Stings Protocols and apointment advised within 4 hours. Scheduled for 05/17/12 with Dr. Clent Ridges.

## 2012-05-19 LAB — REMOTE ICD DEVICE
DEVICE MODEL ICD: 480118
HV IMPEDENCE: 59 Ohm
RV LEAD AMPLITUDE: 25 mv
TZAT-0001FASTVT: 2
TZAT-0001SLOWVT: 2
TZAT-0013SLOWVT: 2
TZAT-0018FASTVT: NEGATIVE
TZAT-0018SLOWVT: NEGATIVE
TZON-0003SLOWVT: 352.9 ms
TZST-0001FASTVT: 7
TZST-0001FASTVT: 8
TZST-0001SLOWVT: 4
TZST-0001SLOWVT: 5
TZST-0001SLOWVT: 7
TZST-0003FASTVT: 31 J
TZST-0003FASTVT: 41 J
TZST-0003FASTVT: 41 J
TZST-0003SLOWVT: 21 J
TZST-0003SLOWVT: 31 J
TZST-0003SLOWVT: 41 J
VENTRICULAR PACING ICD: 99 pct

## 2012-05-26 ENCOUNTER — Other Ambulatory Visit: Payer: Self-pay | Admitting: Cardiology

## 2012-05-26 MED ORDER — POTASSIUM CHLORIDE CRYS ER 20 MEQ PO TBCR: 20.0000 meq | EXTENDED_RELEASE_TABLET | Freq: Every day | ORAL | Status: DC

## 2012-06-08 ENCOUNTER — Ambulatory Visit (INDEPENDENT_AMBULATORY_CARE_PROVIDER_SITE_OTHER): Payer: Medicare Other | Admitting: Internal Medicine

## 2012-06-08 ENCOUNTER — Encounter: Payer: Self-pay | Admitting: Internal Medicine

## 2012-06-08 VITALS — BP 144/80 | HR 72 | Temp 98.6°F | Resp 16 | Ht 66.0 in | Wt 163.0 lb

## 2012-06-08 DIAGNOSIS — I10 Essential (primary) hypertension: Secondary | ICD-10-CM

## 2012-06-08 DIAGNOSIS — N189 Chronic kidney disease, unspecified: Secondary | ICD-10-CM

## 2012-06-08 DIAGNOSIS — E785 Hyperlipidemia, unspecified: Secondary | ICD-10-CM

## 2012-06-08 DIAGNOSIS — L02519 Cutaneous abscess of unspecified hand: Secondary | ICD-10-CM

## 2012-06-08 DIAGNOSIS — E039 Hypothyroidism, unspecified: Secondary | ICD-10-CM

## 2012-06-08 DIAGNOSIS — L03119 Cellulitis of unspecified part of limb: Secondary | ICD-10-CM

## 2012-06-08 DIAGNOSIS — Z23 Encounter for immunization: Secondary | ICD-10-CM

## 2012-06-08 NOTE — Progress Notes (Signed)
  Subjective:    Patient ID: [name], male    DOB: 1932/06/17, 78 y.o.   MRN: [id number]  HPI Treated for cellulitis and has been treated for 6 weeks has been on doxycycline and ciprofloxicin    Review of Systems  Constitutional: Negative for fever and fatigue.  HENT: Positive for dental problem and voice change. Negative for hearing loss, congestion, neck pain and postnasal drip.   Eyes: Negative for discharge, redness and visual disturbance.  Respiratory: Positive for shortness of breath. Negative for cough and wheezing.   Cardiovascular: Positive for leg swelling.  Gastrointestinal: Negative for abdominal pain, constipation and abdominal distention.  Genitourinary: Negative for urgency and frequency.  Musculoskeletal: Negative for joint swelling and arthralgias.  Skin: Negative for color change and rash.  Neurological: Negative for weakness and light-headedness.  Hematological: Negative for adenopathy.  Psychiatric/Behavioral: Negative for behavioral problems.       Objective:   Physical Exam  Nursing note and vitals reviewed. Constitutional: He is oriented to person, place, and time. He appears well-developed and well-nourished.  HENT:  Head: Normocephalic and atraumatic.  Eyes: Conjunctivae normal are normal. Pupils are equal, round, and reactive to light.  Neck: Normal range of motion. Neck supple.  Cardiovascular: Normal rate and regular rhythm.   Murmur heard. Pulmonary/Chest: Effort normal.  Abdominal: Soft. Bowel sounds are normal.  Neurological: He is alert and oriented to person, place, and time.          Assessment & Plan:  Weight stable off the soft drinks Stable HTN Resolving cellulitis on cipro and doxycycline

## 2012-06-09 ENCOUNTER — Ambulatory Visit: Payer: Medicare Other | Admitting: Internal Medicine

## 2012-06-15 ENCOUNTER — Other Ambulatory Visit: Payer: Self-pay | Admitting: Internal Medicine

## 2012-06-18 ENCOUNTER — Ambulatory Visit (INDEPENDENT_AMBULATORY_CARE_PROVIDER_SITE_OTHER): Payer: Medicare Other | Admitting: Family

## 2012-06-18 DIAGNOSIS — I4891 Unspecified atrial fibrillation: Secondary | ICD-10-CM

## 2012-06-18 LAB — POCT INR: INR: 2.1

## 2012-06-18 NOTE — Patient Instructions (Signed)
Continue with 2 mg everyday. Check in 6 weeks.    Latest dosing instructions   Total Sun Mon Tue Wed Thu Fri Sat   14 2 mg 2 mg 2 mg 2 mg 2 mg 2 mg 2 mg    (2 mg1) (2 mg1) (2 mg1) (2 mg1) (2 mg1) (2 mg1) (2 mg1)        

## 2012-07-01 ENCOUNTER — Other Ambulatory Visit: Payer: Self-pay | Admitting: Internal Medicine

## 2012-07-15 ENCOUNTER — Ambulatory Visit (INDEPENDENT_AMBULATORY_CARE_PROVIDER_SITE_OTHER): Payer: Medicare Other | Admitting: Family Medicine

## 2012-07-15 ENCOUNTER — Ambulatory Visit (INDEPENDENT_AMBULATORY_CARE_PROVIDER_SITE_OTHER)
Admission: RE | Admit: 2012-07-15 | Discharge: 2012-07-15 | Disposition: A | Discharge status: Home / Self Care | Payer: Medicare Other | Source: Ambulatory Visit | Attending: Family Medicine | Admitting: Family Medicine

## 2012-07-15 ENCOUNTER — Encounter: Payer: Self-pay | Admitting: Family Medicine

## 2012-07-15 VITALS — BP 152/100 | Temp 97.4°F | Wt 160.0 lb

## 2012-07-15 DIAGNOSIS — M25571 Pain in right ankle and joints of right foot: Secondary | ICD-10-CM

## 2012-07-15 DIAGNOSIS — M25579 Pain in unspecified ankle and joints of unspecified foot: Secondary | ICD-10-CM

## 2012-07-15 DIAGNOSIS — I10 Essential (primary) hypertension: Secondary | ICD-10-CM

## 2012-07-15 MED ORDER — TRAMADOL HCL 50 MG PO TABS: 50.0000 mg | ORAL_TABLET | Freq: Four times a day (QID) | ORAL | Status: DC | PRN

## 2012-07-15 NOTE — Progress Notes (Signed)
  Subjective:    Patient ID: [name], male    DOB: February 11, 1932, 78 y.o.   MRN: [id number]  HPI  Work in. Right ankle and foot pain for about 3 months. No reported injury. Somewhat poorly localized but mostly lateral ankle region. No history of gout and patient was concerned about possibility of this. He has moderate pain with walking but not at rest. His pain is mostly distal fibula and lateral malleolus. No overlying skin changes. He tried heat with slight relief. No claudication symptoms.  Patient has chronic problems including history of hypothyroidism, hypertension, history of atrial fibrillation, chronic systolic heart failure. Medications reviewed. He is on chronic Coumadin with no recent bleeding complications he states is compliant with blood pressure medications.  Past Medical History  Diagnosis Date  . Cardiomyopathy, nonischemic   . Tachycardia induced cardiomyopathy   . LBBB (left bundle branch block)   . CHF (congestive heart failure)   . S/P ablation of atrial fibrillation   . Long term (current) use of anticoagulants   . Hypertension   . Thyroid disease   . S/P AVR (aortic valve replacement) and aortoplasty   . Defibrillator activation   . Hyperlipidemia   . Chronic renal insufficiency   . Aortic valve disease    Past Surgical History  Procedure Date  . Aortic valve replacement   . Coronary artery bypass graft   . Pacmaker,defibr 2006  . Cardiac catheterization 12/04/04  . Aortic valve surgery 1992    St. Jude Valve    reports that he quit smoking about 43 years ago. He has never used smokeless tobacco. He reports that he does not drink alcohol or use illicit drugs. family history includes Heart attack in his father; Heart disease in his mother; Hypertension in his brother; Lung disease in his mother; and Stroke in his brother. Allergies  Allergen Reactions  . Amoxicillin     REACTION: unspecified  . Ezetimibe-Simvastatin     REACTION: unspecified  .  Penicillins   . Sulfamethoxazole W-Trimethoprim     REACTION: nausia       Review of Systems  Constitutional: Negative for fever and chills.  Respiratory: Negative for cough, shortness of breath and wheezing.   Cardiovascular: Negative for chest pain.  Neurological: Negative for dizziness.       Objective:   Physical Exam  Constitutional: He appears well-developed and well-nourished.  Cardiovascular: Normal rate and regular rhythm.   Pulmonary/Chest: Effort normal and breath sounds normal. No respiratory distress. He has no wheezes. He has no rales.  Musculoskeletal:       Right foot exam reveals good distal pulses. He has some soft tissue edema just distal to lateral malleolus. He has a mentholated patch over this region which is removed. No ecchymosis. He has some mild tenderness distal fibula and to a lesser extent just inferior to this region. He has slightly limited range of motion right foot compared to left. No Achilles deformity or tenderness. No bony tenderness in the foot otherwise.          Assessment & Plan:  3 month history of right ankle pain. Doubt acute gout. Differential is degenerative arthritis, tendinitis. No erythema or warmth to suggest infectious origin. Obtain x-rays. Limited tramadol 50 mg 1 or 6 times a day for pain  Hypertension. Somewhat poorly controlled today's visit. Followup with primary soon to reassess

## 2012-07-27 ENCOUNTER — Ambulatory Visit (INDEPENDENT_AMBULATORY_CARE_PROVIDER_SITE_OTHER): Payer: Medicare Other | Admitting: Family

## 2012-07-27 ENCOUNTER — Encounter: Payer: Self-pay | Admitting: Family

## 2012-07-27 ENCOUNTER — Telehealth: Payer: Self-pay | Admitting: Internal Medicine

## 2012-07-27 VITALS — BP 132/74 | HR 70 | Wt 154.0 lb

## 2012-07-27 DIAGNOSIS — I4891 Unspecified atrial fibrillation: Secondary | ICD-10-CM

## 2012-07-27 DIAGNOSIS — Z7901 Long term (current) use of anticoagulants: Secondary | ICD-10-CM

## 2012-07-27 DIAGNOSIS — I10 Essential (primary) hypertension: Secondary | ICD-10-CM

## 2012-07-27 DIAGNOSIS — R7309 Other abnormal glucose: Secondary | ICD-10-CM

## 2012-07-27 DIAGNOSIS — R739 Hyperglycemia, unspecified: Secondary | ICD-10-CM

## 2012-07-27 DIAGNOSIS — E039 Hypothyroidism, unspecified: Secondary | ICD-10-CM

## 2012-07-27 LAB — BASIC METABOLIC PANEL
GFR: 50.39 mL/min — ABNORMAL LOW (ref >60.00)
Potassium: 4.2 mEq/L (ref 3.5–5.1)
Sodium: 135 mEq/L (ref 135–145)

## 2012-07-27 LAB — HEPATIC FUNCTION PANEL
Alkaline Phosphatase: 58 U/L (ref 39–117)
Bilirubin, Direct: 0.2 mg/dL (ref 0.0–0.3)
Total Bilirubin: 0.9 mg/dL (ref 0.3–1.2)

## 2012-07-27 LAB — POCT INR: INR: 1.9

## 2012-07-27 NOTE — Progress Notes (Signed)
Subjective:    Patient ID: [name], male    DOB: [birth year], 78 y.o.   MRN: [id number]  HPI 78 year old white male, nonsmoker, patient of Dr. Lovell Sheehan is in today with complaints of feeling fatigued x3-4 days. Denies any recent medication changes. He was seen by Dr. Caryl Never 2 weeks ago with elevated blood pressure that is since resolved. He denies any sneezing, cough, congestion, nausea, or vomiting. He has a history of hypothyroidism, hyperglycemia, hypertension, and lifelong Coumadin therapy related to atrial fibrillation.   Review of Systems  Constitutional: Positive for fatigue. Negative for fever, chills and appetite change.  HENT: Negative.   Respiratory: Negative.   Cardiovascular: Negative.   Gastrointestinal: Negative.   Musculoskeletal: Negative.   Skin: Negative.   Neurological: Negative.   Hematological: Negative.   Psychiatric/Behavioral: Negative.    Past Medical History  Diagnosis Date  . Cardiomyopathy, nonischemic   . Tachycardia induced cardiomyopathy   . LBBB (left bundle branch block)   . CHF (congestive heart failure)   . S/P ablation of atrial fibrillation   . Long term (current) use of anticoagulants   . Hypertension   . Thyroid disease   . S/P AVR (aortic valve replacement) and aortoplasty   . Defibrillator activation   . Hyperlipidemia   . Chronic renal insufficiency   . Aortic valve disease     History   Social History  . Marital Status: Married    Spouse Name: N/A    Number of Children: N/A  . Years of Education: N/A   Occupational History  . Not on file.   Social History Main Topics  . Smoking status: Former Smoker    Quit date: 08/25/1968  . Smokeless tobacco: Never Used  . Alcohol Use: No  . Drug Use: No  . Sexually Active: Yes   Other Topics Concern  . Not on file   Social History Narrative  . No narrative on file    Past Surgical History  Procedure Date  . Aortic valve replacement   . Coronary artery bypass graft    . Pacmaker,defibr 2006  . Cardiac catheterization 12/04/04  . Aortic valve surgery 1992    St. Jude Valve    Family History  Problem Relation Age of Onset  . Heart disease Mother   . Lung disease Mother   . Heart attack Father   . Stroke Brother   . Hypertension Brother     Allergies  Allergen Reactions  . Amoxicillin     REACTION: unspecified  . Ezetimibe-Simvastatin     REACTION: unspecified  . Penicillins   . Sulfamethoxazole W-Trimethoprim     REACTION: nausia    Current Outpatient Prescriptions on File Prior to Visit  Medication Sig Dispense Refill  . aspirin 81 MG tablet Take 81 mg by mouth daily.        . calcium carbonate (OS-CAL) 600 MG TABS Take 1 tablet (600 mg total) by mouth daily.  60 tablet    . CRESTOR 10 MG tablet TAKE 1 TABLET ONCE DAILY.  30 tablet  6  . furosemide (LASIX) 40 MG tablet Take 1 tablet (40 mg total) by mouth daily.  30 tablet  11  . levothyroxine (SYNTHROID) 100 MCG tablet Take 1 tablet (100 mcg total) by mouth daily.  90 tablet  3  . metoprolol (LOPRESSOR) 50 MG tablet Take 1.5 tablets (75 mg total) by mouth 2 (two) times daily.  90 tablet  12  . potassium chloride SA (K-DUR,KLOR-CON) 20  MEQ tablet Take 1 tablet (20 mEq total) by mouth daily.  30 tablet  5  . ramipril (ALTACE) 10 MG capsule TAKE 1 CAPSULE EVERY DAY.  90 capsule  3  . traMADol (ULTRAM) 50 MG tablet Take 1 tablet (50 mg total) by mouth every 6 (six) hours as needed for pain.  40 tablet  0  . warfarin (COUMADIN) 2 MG tablet Take 1 tablet (2 mg total) by mouth daily.        BP 132/74  Pulse 70  Wt 154 lb (69.854 kg)  SpO2 95%chart    Objective:   Physical Exam  Constitutional: He is oriented to person, place, and time. He appears well-developed and well-nourished.  HENT:  Right Ear: External ear normal.  Left Ear: External ear normal.  Nose: Nose normal.  Mouth/Throat: Oropharynx is clear and moist.  Eyes: Conjunctivae normal are normal. Pupils are equal, round, and  reactive to light.  Neck: Normal range of motion. Neck supple. No thyromegaly present.  Cardiovascular: Normal rate, regular rhythm and normal heart sounds.   Pulmonary/Chest: Effort normal and breath sounds normal.  Abdominal: Soft. Bowel sounds are normal.  Musculoskeletal: Normal range of motion.  Neurological: He is alert and oriented to person, place, and time.  Skin: Skin is warm and dry.  Psychiatric: He has a normal mood and affect.          Assessment & Plan:  Assessment: Fatigue, hypothyroidism, Hypertension, hyperglycemia, atrial fibrillation  Plan: Lab sent to include A1c, BMP, LFTs, TSH notify patient of the results. Patient will take an additional half tablet of Coumadin today only. Recheck INR in 6 weeks and sooner when necessary. Followup pending labs.

## 2012-07-27 NOTE — Patient Instructions (Signed)
Take an extra 1/2 tab today only. Continue with 2 mg everyday. Check in 6 weeks.    Latest dosing instructions   Total Sun Mon Tue Wed Thu Fri Sat   14 2 mg 2 mg 2 mg 2 mg 2 mg 2 mg 2 mg    (2 mg1) (2 mg1) (2 mg1) (2 mg1) (2 mg1) (2 mg1) (2 mg1)

## 2012-07-27 NOTE — Telephone Encounter (Signed)
Pt's wife aware, per Oran Rein, pt should continue fluid pill but be sure to increase intake of fluid

## 2012-07-27 NOTE — Telephone Encounter (Signed)
Pts spouse called and had a question that she wanted to ask Padonda. Pt is dehydrated, should pt stop taking the fluid pill.

## 2012-07-29 ENCOUNTER — Telehealth: Payer: Self-pay | Admitting: Internal Medicine

## 2012-07-29 ENCOUNTER — Ambulatory Visit (INDEPENDENT_AMBULATORY_CARE_PROVIDER_SITE_OTHER): Payer: Medicare Other | Admitting: *Deleted

## 2012-07-29 DIAGNOSIS — I5022 Chronic systolic (congestive) heart failure: Secondary | ICD-10-CM

## 2012-07-29 DIAGNOSIS — Z95810 Presence of automatic (implantable) cardiac defibrillator: Secondary | ICD-10-CM

## 2012-07-29 NOTE — Telephone Encounter (Signed)
New Problem:    Patient's wife called in because her husband is not feeling well, they have seen his PCP and wanted to know if he needed to be see soon.  Please call back.

## 2012-07-29 NOTE — Telephone Encounter (Signed)
Spoke with patients wife.  He needs to keep hydrated and follow up with his PCP if not getting better.  He is feeling some better than earlier but it is taking some time.  I will check back with him at the beginning of the week

## 2012-07-30 ENCOUNTER — Encounter: Payer: Medicare Other | Admitting: Family

## 2012-08-02 NOTE — Telephone Encounter (Signed)
When he stands up real quickly he gets light headed but and this weekend he went to see his son.  He went a different way and got a little lost.  He is going to talk to Dr Lovell Sheehan about this

## 2012-08-16 LAB — REMOTE ICD DEVICE
ATRIAL PACING ICD: 0 pct
CHARGE TIME: 9 s
DEVICE MODEL ICD: 480118
FVT: 0
PACEART VT: 0
TOT-0006: 20131128000000
TZAT-0001FASTVT: 2
TZAT-0001SLOWVT: 1
TZAT-0013FASTVT: 1
TZAT-0013SLOWVT: 2
TZAT-0018FASTVT: NEGATIVE
TZAT-0018SLOWVT: NEGATIVE
TZST-0001FASTVT: 4
TZST-0001FASTVT: 6
TZST-0001FASTVT: 7
TZST-0001SLOWVT: 3
TZST-0001SLOWVT: 4
TZST-0001SLOWVT: 5
TZST-0001SLOWVT: 7
TZST-0003FASTVT: 23 J
TZST-0003FASTVT: 31 J
TZST-0003SLOWVT: 21 J
TZST-0003SLOWVT: 41 J
TZST-0003SLOWVT: 41 J
VENTRICULAR PACING ICD: 99 pct

## 2012-08-27 ENCOUNTER — Encounter: Payer: Self-pay | Admitting: *Deleted

## 2012-09-02 ENCOUNTER — Encounter: Payer: Self-pay | Admitting: Internal Medicine

## 2012-09-06 ENCOUNTER — Ambulatory Visit (INDEPENDENT_AMBULATORY_CARE_PROVIDER_SITE_OTHER): Payer: Medicare Other | Admitting: Family

## 2012-09-06 DIAGNOSIS — I4891 Unspecified atrial fibrillation: Secondary | ICD-10-CM

## 2012-09-06 NOTE — Patient Instructions (Addendum)
Continue with 2 mg everyday. Check in 6 weeks.    Latest dosing instructions   Total Sun Mon Tue Wed Thu Fri Sat   14 2 mg 2 mg 2 mg 2 mg 2 mg 2 mg 2 mg    (2 mg1) (2 mg1) (2 mg1) (2 mg1) (2 mg1) (2 mg1) (2 mg1)

## 2012-09-08 ENCOUNTER — Encounter: Payer: Medicare Other | Admitting: Family

## 2012-09-15 ENCOUNTER — Other Ambulatory Visit: Payer: Self-pay | Admitting: Internal Medicine

## 2012-10-06 ENCOUNTER — Ambulatory Visit (INDEPENDENT_AMBULATORY_CARE_PROVIDER_SITE_OTHER): Payer: Medicare Other | Admitting: Family

## 2012-10-06 ENCOUNTER — Ambulatory Visit: Payer: Medicare Other | Admitting: Internal Medicine

## 2012-10-06 DIAGNOSIS — I4891 Unspecified atrial fibrillation: Secondary | ICD-10-CM

## 2012-10-06 NOTE — Patient Instructions (Addendum)
Take an extra 1/2 tablet today only then Continue with 2 mg everyday. Check in 6 weeks.  Anticoagulation Dose Instructions as of 10/06/2012     [name] Tue Wed Thu Fri Sat   New Dose 2 mg 2 mg 2 mg 2 mg 2 mg 2 mg 2 mg    Description       Take an extra 1/2 tablet today only then Continue with 2 mg everyday. Check in 6 weeks.

## 2012-10-18 ENCOUNTER — Encounter: Payer: Self-pay | Admitting: Internal Medicine

## 2012-10-18 ENCOUNTER — Ambulatory Visit (INDEPENDENT_AMBULATORY_CARE_PROVIDER_SITE_OTHER): Payer: Medicare Other | Admitting: Internal Medicine

## 2012-10-18 VITALS — BP 135/80 | HR 76 | Temp 98.6°F | Resp 16 | Ht 66.0 in | Wt 162.0 lb

## 2012-10-18 MED ORDER — ATORVASTATIN CALCIUM 40 MG PO TABS: 40.0000 mg | ORAL_TABLET | Freq: Every day | ORAL | Status: DC

## 2012-10-18 NOTE — Progress Notes (Signed)
Subjective:    Patient ID: [name], male    DOB: 18-Jun-1932, 79 y.o.   MRN: [id number]  HPI  Had a fall woith fractured arm and cut lip Refused the use of a cane States that the surface was slick  Review of Systems  Constitutional: Negative for fever and fatigue.  HENT: Positive for congestion. Negative for hearing loss, neck pain and postnasal drip.   Eyes: Negative for discharge, redness and visual disturbance.  Respiratory: Negative for cough, shortness of breath and wheezing.   Cardiovascular: Positive for leg swelling.  Gastrointestinal: Negative for abdominal pain, constipation and abdominal distention.  Genitourinary: Negative for urgency and frequency.  Musculoskeletal: Negative for joint swelling and arthralgias.  Skin: Negative for color change and rash.  Neurological: Negative for weakness and light-headedness.  Hematological: Negative for adenopathy.  Psychiatric/Behavioral: Negative for behavioral problems.   Past Medical History  Diagnosis Date  . Cardiomyopathy, nonischemic   . Tachycardia induced cardiomyopathy   . LBBB (left bundle branch block)   . CHF (congestive heart failure)   . S/P ablation of atrial fibrillation   . Long term (current) use of anticoagulants   . Hypertension   . Thyroid disease   . S/P AVR (aortic valve replacement) and aortoplasty   . Defibrillator activation   . Hyperlipidemia   . Chronic renal insufficiency   . Aortic valve disease     History   Social History  . Marital Status: Married    Spouse Name: N/A    Number of Children: N/A  . Years of Education: N/A   Occupational History  . Not on file.   Social History Main Topics  . Smoking status: Former Smoker    Quit date: 08/25/1968  . Smokeless tobacco: Never Used  . Alcohol Use: No  . Drug Use: No  . Sexually Active: Yes   Other Topics Concern  . Not on file   Social History Narrative  . No narrative on file    Past Surgical History  Procedure  Laterality Date  . Aortic valve replacement    . Coronary artery bypass graft    . Pacmaker,defibr  2006  . Cardiac catheterization  12/04/04  . Aortic valve surgery  1992    St. Jude Valve    Family History  Problem Relation Age of Onset  . Heart disease Mother   . Lung disease Mother   . Heart attack Father   . Stroke Brother   . Hypertension Brother     Allergies  Allergen Reactions  . Amoxicillin     REACTION: unspecified  . Ezetimibe-Simvastatin     REACTION: unspecified  . Penicillins   . Sulfamethoxazole W-Trimethoprim     REACTION: nausia    Current Outpatient Prescriptions on File Prior to Visit  Medication Sig Dispense Refill  . aspirin 81 MG tablet Take 81 mg by mouth daily.        . calcium carbonate (OS-CAL) 600 MG TABS Take 1 tablet (600 mg total) by mouth daily.  60 tablet    . CRESTOR 10 MG tablet TAKE 1 TABLET ONCE DAILY.  30 tablet  6  . furosemide (LASIX) 40 MG tablet Take 1 tablet (40 mg total) by mouth daily.  30 tablet  11  . levothyroxine (SYNTHROID) 100 MCG tablet Take 1 tablet (100 mcg total) by mouth daily.  90 tablet  3  . metoprolol (LOPRESSOR) 50 MG tablet Take 1.5 tablets (75 mg total) by mouth 2 (two) times  daily.  90 tablet  12  . potassium chloride SA (K-DUR,KLOR-CON) 20 MEQ tablet Take 1 tablet (20 mEq total) by mouth daily.  30 tablet  5  . ramipril (ALTACE) 10 MG capsule TAKE 1 CAPSULE EVERY DAY.  90 capsule  3  . traMADol (ULTRAM) 50 MG tablet Take 1 tablet (50 mg total) by mouth every 6 (six) hours as needed for pain.  40 tablet  0  . warfarin (COUMADIN) 2 MG tablet TAKE 2 TABLETS (4MG ) ON TUES AND FRI, AND 1 TABLET (2MG ) ON ALL OTHERDAYS.  100 tablet  3   No current facility-administered medications on file prior to visit.    BP 150/80  Pulse 76  Temp(Src) 98.6 F (37 C)  Resp 16  Ht 5\' 6"  (1.676 m)  Wt 162 lb (73.483 kg)  BMI 26.16 kg/m2       Objective:   Physical Exam  Constitutional: He appears well-developed and  well-nourished.  HENT:  Head: Normocephalic and atraumatic.  Eyes: Conjunctivae are normal. Pupils are equal, round, and reactive to light.  Neck: Normal range of motion. Neck supple.  Cardiovascular: Normal rate and regular rhythm.   Pulmonary/Chest: Effort normal.  Abdominal: Soft. Bowel sounds are normal.          Assessment & Plan:  Discussed changing to generic statin Requesting a handicap card due to fall and walking issues  Objective blood pressure 130/80 Does not have any episodes of dizziness or lightheadedness on standing has had no TIA symptoms

## 2012-10-18 NOTE — Patient Instructions (Addendum)
Take 1/2 of the lipitor

## 2012-10-28 ENCOUNTER — Encounter: Payer: Medicare Other | Admitting: Internal Medicine

## 2012-11-01 ENCOUNTER — Encounter: Payer: Self-pay | Admitting: Internal Medicine

## 2012-11-01 ENCOUNTER — Ambulatory Visit (INDEPENDENT_AMBULATORY_CARE_PROVIDER_SITE_OTHER): Payer: Medicare Other | Admitting: Internal Medicine

## 2012-11-01 VITALS — BP 146/91 | HR 72 | Ht 65.5 in | Wt 160.0 lb

## 2012-11-01 DIAGNOSIS — I5022 Chronic systolic (congestive) heart failure: Secondary | ICD-10-CM

## 2012-11-01 DIAGNOSIS — I4891 Unspecified atrial fibrillation: Secondary | ICD-10-CM

## 2012-11-01 DIAGNOSIS — Z95810 Presence of automatic (implantable) cardiac defibrillator: Secondary | ICD-10-CM

## 2012-11-01 LAB — ICD DEVICE OBSERVATION
AL IMPEDENCE ICD: 575 Ohm
HV IMPEDENCE: 60 Ohm
LV LEAD IMPEDENCE ICD: 559 Ohm
LV LEAD THRESHOLD: 1.1 V
RV LEAD IMPEDENCE ICD: 527 Ohm
TZAT-0001FASTVT: 1
TZAT-0001SLOWVT: 1
TZAT-0013FASTVT: 1
TZAT-0013SLOWVT: 2
TZAT-0018FASTVT: NEGATIVE
TZAT-0018SLOWVT: NEGATIVE
TZAT-0018SLOWVT: NEGATIVE
TZON-0003SLOWVT: 352.9 ms
TZST-0001FASTVT: 3
TZST-0001FASTVT: 4
TZST-0001SLOWVT: 3
TZST-0001SLOWVT: 6
TZST-0001SLOWVT: 7
TZST-0003FASTVT: 41 J
TZST-0003FASTVT: 41 J
TZST-0003SLOWVT: 41 J
TZST-0003SLOWVT: 41 J

## 2012-11-01 NOTE — Assessment & Plan Note (Signed)
His heart failure symptoms remain class II. He will continue his current medical therapy.

## 2012-11-01 NOTE — Assessment & Plan Note (Signed)
His ventricular rate is well controlled. He will continue his current medical therapy. 

## 2012-11-01 NOTE — Progress Notes (Signed)
HPI [name] returns today for followup. He is a very pleasant 79 year old man with a nonischemic cardiomyopathy, chronic systolic heart failure, uncontrolled atrial fibrillation, status post AV node ablation, and insertion of a biventricular ICD. In the interim, he has been stable. He notes a fall which occurred several months ago. He broke his arm. He notes that he is not a strong on his feet as he once was. He denies chest pain, or syncope. He has class II heart failure symptoms. Allergies  Allergen Reactions  . Amoxicillin     REACTION: unspecified  . Ezetimibe-Simvastatin     REACTION: unspecified  . Penicillins   . Sulfamethoxazole W-Trimethoprim     REACTION: nausia     Current Outpatient Prescriptions  Medication Sig Dispense Refill  . aspirin 81 MG tablet Take 81 mg by mouth daily.        Marland Kitchen atorvastatin (LIPITOR) 20 MG tablet Take 20 mg by mouth daily.      . calcium carbonate (OS-CAL) 600 MG TABS Take 1 tablet (600 mg total) by mouth daily.  60 tablet    . furosemide (LASIX) 40 MG tablet Take 1 tablet (40 mg total) by mouth daily.  30 tablet  11  . levothyroxine (SYNTHROID) 100 MCG tablet Take 1 tablet (100 mcg total) by mouth daily.  90 tablet  3  . metoprolol (LOPRESSOR) 50 MG tablet Take 1.5 tablets (75 mg total) by mouth 2 (two) times daily.  90 tablet  12  . potassium chloride SA (K-DUR,KLOR-CON) 20 MEQ tablet Take 1 tablet (20 mEq total) by mouth daily.  30 tablet  5  . ramipril (ALTACE) 10 MG capsule TAKE 1 CAPSULE EVERY DAY.  90 capsule  3  . traMADol (ULTRAM) 50 MG tablet Take 1 tablet (50 mg total) by mouth every 6 (six) hours as needed for pain.  40 tablet  0  . warfarin (COUMADIN) 2 MG tablet TAKE 2 TABLETS (4MG ) ON TUES AND FRI, AND 1 TABLET (2MG ) ON ALL OTHERDAYS.  100 tablet  3   No current facility-administered medications for this visit.     Past Medical History  Diagnosis Date  . Cardiomyopathy, nonischemic   . Tachycardia induced cardiomyopathy   .  LBBB (left bundle branch block)   . CHF (congestive heart failure)   . S/P ablation of atrial fibrillation   . Long term (current) use of anticoagulants   . Hypertension   . Thyroid disease   . S/P AVR (aortic valve replacement) and aortoplasty   . Defibrillator activation   . Hyperlipidemia   . Chronic renal insufficiency   . Aortic valve disease     ROS:   All systems reviewed and negative except as noted in the HPI.   Past Surgical History  Procedure Laterality Date  . Aortic valve replacement    . Coronary artery bypass graft    . Pacmaker,defibr  2006  . Cardiac catheterization  12/04/04  . Aortic valve surgery  1992    St. Jude Valve     Family History  Problem Relation Age of Onset  . Heart disease Mother   . Lung disease Mother   . Heart attack Father   . Stroke Brother   . Hypertension Brother      History   Social History  . Marital Status: Married    Spouse Name: N/A    Number of Children: N/A  . Years of Education: N/A   Occupational History  . Not on file.  Social History Main Topics  . Smoking status: Former Smoker    Quit date: 08/25/1968  . Smokeless tobacco: Never Used  . Alcohol Use: No  . Drug Use: No  . Sexually Active: Yes   Other Topics Concern  . Not on file   Social History Narrative  . No narrative on file     BP 146/91  Pulse 72  Ht 5' 5.5" (1.664 m)  Wt 160 lb (72.576 kg)  BMI 26.21 kg/m2  SpO2 97%  Physical Exam:  Chronically ill appearing 79 year old man,NAD HEENT: Unremarkable Neck:  7 cm JVD, no thyromegally Lungs:  Clear with no wheezes, rales, or rhonchi. HEART:  Regular rate rhythm, no murmurs, no rubs, no clicks Abd:  soft, positive bowel sounds, no organomegally, no rebound, no guarding Ext:  2 plus pulses, no edema, no cyanosis, no clubbing Skin:  No rashes no nodules Neuro:  CN II through XII intact, motor grossly intact  DEVICE  Normal device function.  See PaceArt for details.    Assess/Plan:

## 2012-11-01 NOTE — Assessment & Plan Note (Signed)
His [employer] biventricular ICD is working normally. We'll plan to recheck in several months.

## 2012-11-01 NOTE — Patient Instructions (Addendum)
Your physician wants you to follow-up in: ONE YEAR WITH DR Court Joy will receive a reminder letter in the mail two months in advance. If you don't receive a letter, please call our office to schedule the follow-up appointment.   Remote monitoring is used to monitor your Pacemaker of ICD from home. This monitoring reduces the number of office visits required to check your device to one time per year. It allows [place] to keep an eye on the functioning of your device to ensure it is working properly. You are scheduled for a device check from home on February 10, 2013. You may send your transmission at any time that day. If you have a wireless device, the transmission will be sent automatically. After your physician reviews your transmission, you will receive a postcard with your next transmission date.

## 2012-11-03 ENCOUNTER — Ambulatory Visit (INDEPENDENT_AMBULATORY_CARE_PROVIDER_SITE_OTHER): Payer: Medicare Other | Admitting: Family

## 2012-11-03 DIAGNOSIS — I4891 Unspecified atrial fibrillation: Secondary | ICD-10-CM

## 2012-11-03 NOTE — Patient Instructions (Addendum)
Take 1 1/2 tablet every Wednesday.  All other days, 1 tablet (2 mg) everyday. Check in 4 weeks.  Anticoagulation Dose Instructions as of 11/03/2012     Glynis Smiles Tue Wed Thu Fri Sat   New Dose 2 mg 2 mg 2 mg 3 mg 2 mg 2 mg 2 mg    Description       Take 1 1/2 tablet every Wednesday.  All other days, 1 tablet (2 mg) everyday. Check in 4 weeks.

## 2012-11-26 ENCOUNTER — Other Ambulatory Visit: Payer: Self-pay | Admitting: Cardiology

## 2012-11-26 MED ORDER — POTASSIUM CHLORIDE CRYS ER 20 MEQ PO TBCR: 20.0000 meq | EXTENDED_RELEASE_TABLET | Freq: Every day | ORAL | Status: DC

## 2012-11-26 MED ORDER — FUROSEMIDE 40 MG PO TABS: 40.0000 mg | ORAL_TABLET | Freq: Every day | ORAL | Status: DC

## 2012-11-26 MED ORDER — METOPROLOL TARTRATE 50 MG PO TABS: 75.0000 mg | ORAL_TABLET | Freq: Two times a day (BID) | ORAL | Status: DC

## 2012-12-01 ENCOUNTER — Ambulatory Visit (INDEPENDENT_AMBULATORY_CARE_PROVIDER_SITE_OTHER): Payer: Medicare Other | Admitting: Family

## 2012-12-01 DIAGNOSIS — I4891 Unspecified atrial fibrillation: Secondary | ICD-10-CM

## 2012-12-01 NOTE — Patient Instructions (Addendum)
Today take 2 tablets only. Take 1 1/2 tablet every Wednesday.  All other days, 1 tablet (2 mg) everyday. Check in 4 weeks.  Anticoagulation Dose Instructions as of 12/01/2012     Glynis Smiles Tue Wed Thu Fri Sat   New Dose 2 mg 2 mg 2 mg 3 mg 2 mg 2 mg 2 mg    Description       Today take 2 tablets only. Take 1 1/2 tablet every Wednesday.  All other days, 1 tablet (2 mg) everyday. Check in 4 weeks.

## 2012-12-29 ENCOUNTER — Ambulatory Visit (INDEPENDENT_AMBULATORY_CARE_PROVIDER_SITE_OTHER): Payer: 59 | Admitting: Family

## 2012-12-29 DIAGNOSIS — I4891 Unspecified atrial fibrillation: Secondary | ICD-10-CM

## 2012-12-29 LAB — POCT INR: INR: 1.7

## 2012-12-29 NOTE — Patient Instructions (Addendum)
Today take 2 tablets only. Take 1 1/2 tablet every Wednesday and Friday.  All other days, 1 tablet (2 mg) everyday. Check in 3 weeks.  Anticoagulation Dose Instructions as of 12/29/2012     Glynis Smiles Tue Wed Thu Fri Sat   New Dose 2 mg 2 mg 2 mg 3 mg 2 mg 3 mg 2 mg    Description       Today take 2 tablets only. Take 1 1/2 tablet every Wednesday and Friday.  All other days, 1 tablet (2 mg) everyday. Check in 3 weeks.

## 2013-01-19 ENCOUNTER — Ambulatory Visit (INDEPENDENT_AMBULATORY_CARE_PROVIDER_SITE_OTHER): Payer: 59 | Admitting: Family

## 2013-01-19 DIAGNOSIS — I4891 Unspecified atrial fibrillation: Secondary | ICD-10-CM

## 2013-01-19 NOTE — Patient Instructions (Addendum)
Take 1 1/2 tablet every Monday, Wednesday, and Friday.  All other days, 1 tablet (2 mg) everyday. Check in 4 weeks.  Anticoagulation Dose Instructions as of 01/19/2013     Glynis Smiles Tue Wed Thu Fri Sat   New Dose 2 mg 3 mg 2 mg 3 mg 2 mg 3 mg 2 mg    Description       Take 1 1/2 tablet every Monday, Wednesday, and Friday.  All other days, 1 tablet (2 mg) everyday. Check in 4 weeks.

## 2013-02-10 ENCOUNTER — Encounter: Payer: Medicare Other | Admitting: *Deleted

## 2013-02-15 ENCOUNTER — Ambulatory Visit (INDEPENDENT_AMBULATORY_CARE_PROVIDER_SITE_OTHER): Payer: 59 | Admitting: Family

## 2013-02-15 DIAGNOSIS — I4891 Unspecified atrial fibrillation: Secondary | ICD-10-CM

## 2013-02-15 LAB — POCT INR: INR: 2

## 2013-02-15 NOTE — Patient Instructions (Addendum)
Take 1 1/2 tablet every Monday, Wednesday, and Friday.  All other days, 1 tablet (2 mg) everyday. Check in 4 weeks. Anticoagulation Dose Instructions as of 02/15/2013     Glynis Smiles Tue Wed Thu Fri Sat   New Dose 2 mg 3 mg 2 mg 3 mg 2 mg 3 mg 2 mg    Description       Take 1 1/2 tablet every Monday, Wednesday, and Friday.  All other days, 1 tablet (2 mg) everyday. Check in 4 weeks.

## 2013-02-16 ENCOUNTER — Ambulatory Visit: Payer: Medicare Other | Admitting: Internal Medicine

## 2013-02-16 ENCOUNTER — Encounter: Payer: 59 | Admitting: Family

## 2013-02-17 ENCOUNTER — Encounter: Payer: Self-pay | Admitting: Internal Medicine

## 2013-02-17 ENCOUNTER — Ambulatory Visit (INDEPENDENT_AMBULATORY_CARE_PROVIDER_SITE_OTHER): Payer: 59 | Admitting: *Deleted

## 2013-02-17 DIAGNOSIS — Z95810 Presence of automatic (implantable) cardiac defibrillator: Secondary | ICD-10-CM

## 2013-02-17 DIAGNOSIS — I5022 Chronic systolic (congestive) heart failure: Secondary | ICD-10-CM

## 2013-02-28 LAB — REMOTE ICD DEVICE
AL IMPEDENCE ICD: 567 Ohm
ATRIAL PACING ICD: 0 pct
DEVICE MODEL ICD: 480118
FVT: 0
HV IMPEDENCE: 58 Ohm
PACEART VT: 0
TZAT-0001FASTVT: 1
TZAT-0001SLOWVT: 2
TZAT-0002FASTVT: NEGATIVE
TZAT-0013FASTVT: 1
TZAT-0013SLOWVT: 2
TZAT-0018FASTVT: NEGATIVE
TZAT-0018FASTVT: NEGATIVE
TZAT-0018SLOWVT: NEGATIVE
TZON-0003SLOWVT: 352.9 ms
TZST-0001FASTVT: 4
TZST-0001FASTVT: 5
TZST-0001FASTVT: 8
TZST-0001SLOWVT: 5
TZST-0001SLOWVT: 7
TZST-0003FASTVT: 41 J
TZST-0003FASTVT: 41 J
TZST-0003FASTVT: 41 J
TZST-0003SLOWVT: 31 J
TZST-0003SLOWVT: 41 J
VENTRICULAR PACING ICD: 91 pct
VF: 0

## 2013-03-15 ENCOUNTER — Encounter: Payer: 59 | Admitting: Family

## 2013-03-17 ENCOUNTER — Ambulatory Visit (INDEPENDENT_AMBULATORY_CARE_PROVIDER_SITE_OTHER): Payer: 59 | Admitting: General Practice

## 2013-03-17 DIAGNOSIS — I4891 Unspecified atrial fibrillation: Secondary | ICD-10-CM

## 2013-03-30 ENCOUNTER — Other Ambulatory Visit: Payer: Self-pay

## 2013-04-14 ENCOUNTER — Ambulatory Visit (INDEPENDENT_AMBULATORY_CARE_PROVIDER_SITE_OTHER): Payer: 59 | Admitting: General Practice

## 2013-04-14 DIAGNOSIS — I4891 Unspecified atrial fibrillation: Secondary | ICD-10-CM

## 2013-04-14 DIAGNOSIS — Z7901 Long term (current) use of anticoagulants: Secondary | ICD-10-CM

## 2013-04-14 LAB — POCT INR: INR: 2.3

## 2013-04-26 ENCOUNTER — Other Ambulatory Visit: Payer: Self-pay | Admitting: Internal Medicine

## 2013-05-04 ENCOUNTER — Telehealth: Payer: Self-pay | Admitting: Internal Medicine

## 2013-05-04 NOTE — Telephone Encounter (Signed)
[facility] nurse practitioner saw [name] for his yearly visit today. She states he had a 1+ protein in his urine, and wanted Dr. Lovell Sheehan to be aware.

## 2013-05-04 NOTE — Telephone Encounter (Signed)
Ov 9-18,next Thursday for pro time- do you want him to recheck urine and maybe a micro albumin at that time?

## 2013-05-12 ENCOUNTER — Ambulatory Visit (INDEPENDENT_AMBULATORY_CARE_PROVIDER_SITE_OTHER): Payer: 59 | Admitting: General Practice

## 2013-05-12 DIAGNOSIS — I4891 Unspecified atrial fibrillation: Secondary | ICD-10-CM

## 2013-05-12 DIAGNOSIS — Z7901 Long term (current) use of anticoagulants: Secondary | ICD-10-CM

## 2013-05-18 ENCOUNTER — Other Ambulatory Visit: Payer: Self-pay | Admitting: Internal Medicine

## 2013-05-18 ENCOUNTER — Ambulatory Visit: Payer: Self-pay | Admitting: Internal Medicine

## 2013-05-19 ENCOUNTER — Ambulatory Visit (INDEPENDENT_AMBULATORY_CARE_PROVIDER_SITE_OTHER): Payer: 59 | Admitting: *Deleted

## 2013-05-19 DIAGNOSIS — I4891 Unspecified atrial fibrillation: Secondary | ICD-10-CM

## 2013-05-23 ENCOUNTER — Encounter: Payer: Self-pay | Admitting: Internal Medicine

## 2013-05-23 ENCOUNTER — Ambulatory Visit (INDEPENDENT_AMBULATORY_CARE_PROVIDER_SITE_OTHER): Payer: Medicare Other | Admitting: Internal Medicine

## 2013-05-23 VITALS — BP 140/88 | HR 76 | Temp 98.2°F | Resp 16 | Ht 65.5 in | Wt 168.0 lb

## 2013-05-23 DIAGNOSIS — M67919 Unspecified disorder of synovium and tendon, unspecified shoulder: Secondary | ICD-10-CM | POA: Diagnosis not present

## 2013-05-23 DIAGNOSIS — Z23 Encounter for immunization: Secondary | ICD-10-CM

## 2013-05-23 DIAGNOSIS — E039 Hypothyroidism, unspecified: Secondary | ICD-10-CM

## 2013-05-23 DIAGNOSIS — M1710 Unilateral primary osteoarthritis, unspecified knee: Secondary | ICD-10-CM

## 2013-05-23 DIAGNOSIS — M7552 Bursitis of left shoulder: Secondary | ICD-10-CM

## 2013-05-23 DIAGNOSIS — I10 Essential (primary) hypertension: Secondary | ICD-10-CM

## 2013-05-23 LAB — REMOTE ICD DEVICE
ATRIAL PACING ICD: 0 pct
CHARGE TIME: 9.2 s
DEVICE MODEL ICD: 480118
HV IMPEDENCE: 58 Ohm
LV LEAD IMPEDENCE ICD: 536 Ohm
RV LEAD IMPEDENCE ICD: 523 Ohm
TZAT-0001FASTVT: 1
TZAT-0001FASTVT: 2
TZAT-0001SLOWVT: 1
TZAT-0002FASTVT: NEGATIVE
TZAT-0013SLOWVT: 2
TZAT-0018FASTVT: NEGATIVE
TZAT-0018SLOWVT: NEGATIVE
TZON-0003FASTVT: 300 ms
TZON-0003SLOWVT: 352.9 ms
TZST-0001FASTVT: 3
TZST-0001FASTVT: 4
TZST-0001FASTVT: 5
TZST-0001FASTVT: 6
TZST-0001SLOWVT: 4
TZST-0001SLOWVT: 5
TZST-0001SLOWVT: 6
TZST-0003FASTVT: 23 J
TZST-0003FASTVT: 31 J
TZST-0003FASTVT: 41 J
TZST-0003FASTVT: 41 J
TZST-0003FASTVT: 41 J
TZST-0003FASTVT: 41 J
TZST-0003SLOWVT: 41 J
TZST-0003SLOWVT: 41 J

## 2013-05-23 LAB — CBC WITH DIFFERENTIAL/PLATELET
Basophils Relative: 0.5 % (ref 0.0–3.0)
Eosinophils Absolute: 0.3 10*3/uL (ref 0.0–0.7)
Eosinophils Relative: 4.7 % (ref 0.0–5.0)
HCT: 46.9 % (ref 39.0–52.0)
Hemoglobin: 15.6 g/dL (ref 13.0–17.0)
Lymphs Abs: 1.4 10*3/uL (ref 0.7–4.0)
MCHC: 33.2 g/dL (ref 30.0–36.0)
MCV: 92.8 fl (ref 78.0–100.0)
Monocytes Absolute: 0.7 10*3/uL (ref 0.1–1.0)
Monocytes Relative: 10 % (ref 3.0–12.0)
Neutro Abs: 4.7 10*3/uL (ref 1.4–7.7)
Neutrophils Relative %: 65.8 % (ref 43.0–77.0)
RBC: 5.06 Mil/uL (ref 4.22–5.81)
WBC: 7.1 10*3/uL (ref 4.5–10.5)

## 2013-05-23 LAB — BASIC METABOLIC PANEL
BUN: 17 mg/dL (ref 6–23)
CO2: 31 mEq/L (ref 19–32)
Chloride: 107 mEq/L (ref 96–112)
Creatinine, Ser: 1.1 mg/dL (ref 0.4–1.5)
Glucose, Bld: 131 mg/dL — ABNORMAL HIGH (ref 70–99)
Potassium: 5.1 mEq/L (ref 3.5–5.1)

## 2013-05-23 MED ORDER — NABUMETONE 500 MG PO TABS: 500.0000 mg | ORAL_TABLET | Freq: Two times a day (BID) | ORAL | Status: DC | PRN

## 2013-05-23 NOTE — Patient Instructions (Signed)
Take the relafen twice a day as needed with food

## 2013-05-23 NOTE — Progress Notes (Signed)
Subjective:    Patient ID: [name], male    DOB: 06/30/1932, 79 y.o.   MRN: [id number]  HPI Patient is a 79 year old male who presents for followup of history of atrial fibrillation on Coumadin hypertension hyperlipidemia and moderately severe osteoarthritis in his knees and bursitis in his left shoulder.  His chief complaint today of ulcer on tenderness and bursitis in the left shoulder and knee pain that is worse after he mows his yard.  He is stable from the standpoint of blood pressure nitroglycerin fibrillation has no evidence for congestive heart failure   Review of Systems  Constitutional: Positive for fatigue. Negative for fever.  HENT: Negative for hearing loss, congestion, neck pain and postnasal drip.   Eyes: Negative for discharge, redness and visual disturbance.  Respiratory: Negative for cough, shortness of breath and wheezing.   Cardiovascular: Positive for palpitations. Negative for leg swelling.  Gastrointestinal: Negative for abdominal pain, constipation and abdominal distention.  Genitourinary: Negative for urgency and frequency.  Musculoskeletal: Positive for myalgias and joint swelling. Negative for arthralgias.  Skin: Negative for color change and rash.  Neurological: Negative for weakness and light-headedness.  Hematological: Negative for adenopathy.  Psychiatric/Behavioral: Negative for behavioral problems.   Past Medical History  Diagnosis Date  . Cardiomyopathy, nonischemic   . Tachycardia induced cardiomyopathy   . LBBB (left bundle branch block)   . CHF (congestive heart failure)   . S/P ablation of atrial fibrillation   . Long term (current) use of anticoagulants   . Hypertension   . Thyroid disease   . S/P AVR (aortic valve replacement) and aortoplasty   . Defibrillator activation   . Hyperlipidemia   . Chronic renal insufficiency   . Aortic valve disease     History   Social History  . Marital Status: Married    Spouse Name: N/A     Number of Children: N/A  . Years of Education: N/A   Occupational History  . Not on file.   Social History Main Topics  . Smoking status: Former Smoker    Quit date: 08/25/1968  . Smokeless tobacco: Never Used  . Alcohol Use: No  . Drug Use: No  . Sexual Activity: Yes   Other Topics Concern  . Not on file   Social History Narrative  . No narrative on file    Past Surgical History  Procedure Laterality Date  . Aortic valve replacement    . Coronary artery bypass graft    . Pacmaker,defibr  2006  . Cardiac catheterization  12/04/04  . Aortic valve surgery  1992    St. Jude Valve    Family History  Problem Relation Age of Onset  . Heart disease Mother   . Lung disease Mother   . Heart attack Father   . Stroke Brother   . Hypertension Brother     Allergies  Allergen Reactions  . Amoxicillin     REACTION: unspecified  . Ezetimibe-Simvastatin     REACTION: unspecified  . Penicillins   . Sulfamethoxazole W-Trimethoprim     REACTION: nausia    Current Outpatient Prescriptions on File Prior to Visit  Medication Sig Dispense Refill  . aspirin 81 MG tablet Take 81 mg by mouth daily.        Marland Kitchen atorvastatin (LIPITOR) 20 MG tablet Take 20 mg by mouth daily.      . calcium carbonate (OS-CAL) 600 MG TABS Take 1 tablet (600 mg total) by mouth daily.  60 tablet    .  furosemide (LASIX) 40 MG tablet Take 1 tablet (40 mg total) by mouth daily.  30 tablet  11  . levothyroxine (SYNTHROID, LEVOTHROID) 100 MCG tablet TAKE 1 TABLET ONCE DAILY.  90 tablet  3  . metoprolol (LOPRESSOR) 50 MG tablet Take 1.5 tablets (75 mg total) by mouth 2 (two) times daily.  90 tablet  12  . potassium chloride SA (K-DUR,KLOR-CON) 20 MEQ tablet Take 1 tablet (20 mEq total) by mouth daily.  30 tablet  5  . ramipril (ALTACE) 10 MG capsule TAKE 1 CAPSULE EVERY DAY.  90 capsule  3  . warfarin (COUMADIN) 2 MG tablet TAKE 2 TABLETS (4MG ) ON TUES AND FRI, AND 1 TABLET (2MG ) ON ALL OTHERDAYS.  60 tablet  1    No current facility-administered medications on file prior to visit.    BP 140/88  Pulse 76  Temp(Src) 98.2 F (36.8 C)  Resp 16  Ht 5' 5.5" (1.664 m)  Wt 168 lb (76.204 kg)  BMI 27.52 kg/m2       Objective:   Physical Exam  Constitutional: He is oriented to person, place, and time. He appears well-developed and well-nourished.  HENT:  Head: Normocephalic and atraumatic.  Eyes: Conjunctivae are normal. Pupils are equal, round, and reactive to light.  Neck: Normal range of motion. Neck supple.  Cardiovascular:  Atrial fibrillation  Pulmonary/Chest: Effort normal and breath sounds normal.  Abdominal: Soft. Bowel sounds are normal.  Musculoskeletal: He exhibits tenderness. He exhibits no edema.  Neurological: He is alert and oriented to person, place, and time.          Assessment & Plan:

## 2013-05-23 NOTE — Progress Notes (Signed)
  Subjective:    Patient ID: [name], male    DOB: [birth year], 79 y.o.   MRN: [id number]  HPI    Review of Systems     Objective:   Physical Exam        Assessment & Plan:    Because we are starting a nonsteroidal but it may be safe to use with the Coumadin we will monitor CBC baseline today and if he has any symptomology will repeat the CBC.  He has Coumadin checks on a regular basis for which we will monitor his status on the Relafen.  Otherwise hypertension stable osteoarthritis is the significant issue addressed today.

## 2013-05-24 ENCOUNTER — Encounter: Payer: Self-pay | Admitting: *Deleted

## 2013-05-28 ENCOUNTER — Encounter: Payer: Self-pay | Admitting: Internal Medicine

## 2013-05-31 ENCOUNTER — Other Ambulatory Visit: Payer: Self-pay

## 2013-05-31 MED ORDER — POTASSIUM CHLORIDE CRYS ER 20 MEQ PO TBCR: 20.0000 meq | EXTENDED_RELEASE_TABLET | Freq: Every day | ORAL | Status: DC

## 2013-06-09 ENCOUNTER — Ambulatory Visit (INDEPENDENT_AMBULATORY_CARE_PROVIDER_SITE_OTHER): Payer: 59 | Admitting: General Practice

## 2013-06-09 DIAGNOSIS — I4891 Unspecified atrial fibrillation: Secondary | ICD-10-CM

## 2013-06-09 DIAGNOSIS — Z7901 Long term (current) use of anticoagulants: Secondary | ICD-10-CM

## 2013-06-09 LAB — POCT INR: INR: 2.2

## 2013-06-24 ENCOUNTER — Other Ambulatory Visit: Payer: Self-pay | Admitting: Internal Medicine

## 2013-07-14 ENCOUNTER — Ambulatory Visit (INDEPENDENT_AMBULATORY_CARE_PROVIDER_SITE_OTHER): Payer: 59 | Admitting: General Practice

## 2013-07-14 DIAGNOSIS — I4891 Unspecified atrial fibrillation: Secondary | ICD-10-CM

## 2013-07-14 DIAGNOSIS — Z7901 Long term (current) use of anticoagulants: Secondary | ICD-10-CM

## 2013-07-14 LAB — POCT INR: INR: 1.7

## 2013-07-14 NOTE — Progress Notes (Signed)
Pre-visit discussion using our clinic review tool. No additional management support is needed unless otherwise documented below in the visit note.  

## 2013-07-28 ENCOUNTER — Other Ambulatory Visit: Payer: Self-pay | Admitting: Internal Medicine

## 2013-08-11 ENCOUNTER — Ambulatory Visit (INDEPENDENT_AMBULATORY_CARE_PROVIDER_SITE_OTHER): Payer: 59 | Admitting: General Practice

## 2013-08-11 DIAGNOSIS — I4891 Unspecified atrial fibrillation: Secondary | ICD-10-CM

## 2013-08-11 NOTE — Progress Notes (Signed)
Pre-visit discussion using our clinic review tool. No additional management support is needed unless otherwise documented below in the visit note.  

## 2013-08-22 ENCOUNTER — Other Ambulatory Visit: Payer: Self-pay | Admitting: Internal Medicine

## 2013-09-01 ENCOUNTER — Ambulatory Visit (INDEPENDENT_AMBULATORY_CARE_PROVIDER_SITE_OTHER): Payer: 59 | Admitting: *Deleted

## 2013-09-01 DIAGNOSIS — I4891 Unspecified atrial fibrillation: Secondary | ICD-10-CM

## 2013-09-01 DIAGNOSIS — I5022 Chronic systolic (congestive) heart failure: Secondary | ICD-10-CM

## 2013-09-02 LAB — MDC_IDC_ENUM_SESS_TYPE_REMOTE
Brady Statistic RA Percent Paced: 0 %
Brady Statistic RV Percent Paced: 94 %
Date Time Interrogation Session: 20150108173600
HighPow Impedance: 56 Ohm
Implantable Pulse Generator Serial Number: 480118
Lead Channel Impedance Value: 505 Ohm
Lead Channel Impedance Value: 566 Ohm
Lead Channel Setting Pacing Amplitude: 2 V
Lead Channel Setting Pacing Pulse Width: 0.4 ms
MDC IDC MSMT LEADCHNL RA SENSING INTR AMPL: 0.9 mV
MDC IDC MSMT LEADCHNL RV IMPEDANCE VALUE: 500 Ohm
MDC IDC SET LEADCHNL RV PACING AMPLITUDE: 2.5 V
MDC IDC SET LEADCHNL RV PACING PULSEWIDTH: 0.4 ms
Zone Setting Detection Interval: 250 ms
Zone Setting Detection Interval: 300 ms
Zone Setting Detection Interval: 353 ms

## 2013-09-07 ENCOUNTER — Encounter: Payer: Self-pay | Admitting: *Deleted

## 2013-09-08 ENCOUNTER — Ambulatory Visit (INDEPENDENT_AMBULATORY_CARE_PROVIDER_SITE_OTHER): Payer: 59 | Admitting: General Practice

## 2013-09-08 DIAGNOSIS — I4891 Unspecified atrial fibrillation: Secondary | ICD-10-CM

## 2013-09-08 LAB — POCT INR: INR: 2

## 2013-09-08 NOTE — Progress Notes (Signed)
Pre-visit discussion using our clinic review tool. No additional management support is needed unless otherwise documented below in the visit note.  

## 2013-09-13 ENCOUNTER — Encounter: Payer: Self-pay | Admitting: Internal Medicine

## 2013-09-25 ENCOUNTER — Other Ambulatory Visit: Payer: Self-pay | Admitting: Internal Medicine

## 2013-10-06 ENCOUNTER — Ambulatory Visit (INDEPENDENT_AMBULATORY_CARE_PROVIDER_SITE_OTHER): Payer: 59 | Admitting: General Practice

## 2013-10-06 DIAGNOSIS — Z5181 Encounter for therapeutic drug level monitoring: Secondary | ICD-10-CM | POA: Insufficient documentation

## 2013-10-06 DIAGNOSIS — I4891 Unspecified atrial fibrillation: Secondary | ICD-10-CM

## 2013-10-06 LAB — POCT INR: INR: 2

## 2013-10-06 NOTE — Progress Notes (Signed)
Pre-visit discussion using our clinic review tool. No additional management support is needed unless otherwise documented below in the visit note.  

## 2013-10-27 ENCOUNTER — Other Ambulatory Visit: Payer: Self-pay | Admitting: Internal Medicine

## 2013-11-01 ENCOUNTER — Encounter: Payer: Self-pay | Admitting: Internal Medicine

## 2013-11-01 ENCOUNTER — Ambulatory Visit (INDEPENDENT_AMBULATORY_CARE_PROVIDER_SITE_OTHER): Payer: 59 | Admitting: Internal Medicine

## 2013-11-01 VITALS — BP 142/85 | HR 70 | Ht 65.5 in | Wt 170.0 lb

## 2013-11-01 DIAGNOSIS — Z95810 Presence of automatic (implantable) cardiac defibrillator: Secondary | ICD-10-CM

## 2013-11-01 DIAGNOSIS — I5022 Chronic systolic (congestive) heart failure: Secondary | ICD-10-CM

## 2013-11-01 DIAGNOSIS — I428 Other cardiomyopathies: Secondary | ICD-10-CM

## 2013-11-01 LAB — MDC_IDC_ENUM_SESS_TYPE_INCLINIC
Brady Statistic RV Percent Paced: 94 %
Date Time Interrogation Session: 20150310040000
HIGH POWER IMPEDANCE MEASURED VALUE: 62 Ohm
HighPow Impedance: 36 Ohm
Implantable Pulse Generator Serial Number: 480118
Lead Channel Impedance Value: 522 Ohm
Lead Channel Impedance Value: 565 Ohm
Lead Channel Impedance Value: 583 Ohm
Lead Channel Pacing Threshold Amplitude: 1.2 V
Lead Channel Pacing Threshold Pulse Width: 0.4 ms
Lead Channel Setting Pacing Amplitude: 2 V
Lead Channel Setting Sensing Sensitivity: 0.5 mV
MDC IDC MSMT BATTERY REMAINING LONGEVITY: 65 mo
MDC IDC MSMT LEADCHNL LV PACING THRESHOLD AMPLITUDE: 1 V
MDC IDC MSMT LEADCHNL RA SENSING INTR AMPL: 2.8 mV
MDC IDC MSMT LEADCHNL RV PACING THRESHOLD PULSEWIDTH: 0.4 ms
MDC IDC SET LEADCHNL LV PACING PULSEWIDTH: 0.4 ms
MDC IDC SET LEADCHNL LV SENSING SENSITIVITY: 1 mV
MDC IDC SET LEADCHNL RV PACING AMPLITUDE: 2.5 V
MDC IDC SET LEADCHNL RV PACING PULSEWIDTH: 0.4 ms
Zone Setting Detection Interval: 250 ms
Zone Setting Detection Interval: 300 ms
Zone Setting Detection Interval: 353 ms

## 2013-11-01 NOTE — Assessment & Plan Note (Signed)
His [employer] ICD is working normally. Will recheck in several months. 

## 2013-11-01 NOTE — Patient Instructions (Signed)

## 2013-11-01 NOTE — Progress Notes (Addendum)
HPI [name] returns today for followup. He is a very pleasant 80 year old man with a nonischemic cardiomyopathy, chronic systolic heart failure, uncontrolled atrial fibrillation, status post AV node ablation, and insertion of a biventricular ICD. In the interim, he has had no chest pain or sob. No syncope. He has class II heart failure symptoms. Allergies  Allergen Reactions  . Amoxicillin     REACTION: unspecified  . Ezetimibe-Simvastatin     REACTION: unspecified  . Penicillins   . Sulfamethoxazole-Trimethoprim     REACTION: nausia     Current Outpatient Prescriptions  Medication Sig Dispense Refill  . aspirin 81 MG tablet Take 81 mg by mouth daily.        Marland Kitchen. atorvastatin (LIPITOR) 20 MG tablet Take 20 mg by mouth daily.      . calcium carbonate (OS-CAL) 600 MG TABS Take 1 tablet (600 mg total) by mouth daily.  60 tablet    . furosemide (LASIX) 40 MG tablet Take 1 tablet (40 mg total) by mouth daily.  30 tablet  11  . levothyroxine (SYNTHROID, LEVOTHROID) 100 MCG tablet TAKE 1 TABLET ONCE DAILY.  90 tablet  3  . metoprolol (LOPRESSOR) 50 MG tablet Take 1.5 tablets (75 mg total) by mouth 2 (two) times daily.  90 tablet  12  . nabumetone (RELAFEN) 500 MG tablet Take 1 tablet (500 mg total) by mouth 2 (two) times daily as needed for pain.  60 tablet  11  . potassium chloride SA (K-DUR,KLOR-CON) 20 MEQ tablet TAKE 1 TABLET ONCE DAILY.  30 tablet  0  . ramipril (ALTACE) 10 MG capsule TAKE 1 CAPSULE EVERY DAY.  90 capsule  3  . warfarin (COUMADIN) 2 MG tablet Take as directed by anticoagulation clinic  40 tablet  2   No current facility-administered medications for this visit.     Past Medical History  Diagnosis Date  . Cardiomyopathy, nonischemic   . Tachycardia induced cardiomyopathy   . LBBB (left bundle branch block)   . CHF (congestive heart failure)   . S/P ablation of atrial fibrillation   . Long term (current) use of anticoagulants   . Hypertension   . Thyroid disease    . S/P AVR (aortic valve replacement) and aortoplasty   . Defibrillator activation   . Hyperlipidemia   . Chronic renal insufficiency   . Aortic valve disease     ROS:   All systems reviewed and negative except as noted in the HPI.   Past Surgical History  Procedure Laterality Date  . Aortic valve replacement    . Coronary artery bypass graft    . Pacmaker,defibr  2006  . Cardiac catheterization  12/04/04  . Aortic valve surgery  1992    St. Jude Valve     Family History  Problem Relation Age of Onset  . Heart disease Mother   . Lung disease Mother   . Heart attack Father   . Stroke Brother   . Hypertension Brother      History   Social History  . Marital Status: Married    Spouse Name: N/A    Number of Children: N/A  . Years of Education: N/A   Occupational History  . Not on file.   Social History Main Topics  . Smoking status: Former Smoker    Quit date: 08/25/1968  . Smokeless tobacco: Never Used  . Alcohol Use: No  . Drug Use: No  . Sexual Activity: Yes   Other Topics Concern  .  Not on file   Social History Narrative  . No narrative on file     BP 142/85  Pulse 70  Ht 5' 5.5" (1.664 m)  Wt 170 lb (77.111 kg)  BMI 27.85 kg/m2  Physical Exam:  Chronically ill appearing 80 year old man,NAD HEENT: Unremarkable Neck:  7 cm JVD, no thyromegally Lungs:  Clear with no wheezes, rales, or rhonchi. HEART:  Regular rate rhythm, no murmurs, no rubs, no clicks Abd:  soft, positive bowel sounds, no organomegally, no rebound, no guarding Ext:  2 plus pulses, no edema, no cyanosis, no clubbing Skin:  No rashes no nodules Neuro:  CN II through XII intact, motor grossly intact  ECG - atrial fib with BiVentricular pacing  DEVICE  Normal device function.  See PaceArt for details.   Assess/Plan:

## 2013-11-01 NOTE — Assessment & Plan Note (Signed)
His CHF is well compensated. Will continue his current meds.

## 2013-11-02 ENCOUNTER — Other Ambulatory Visit: Payer: Self-pay | Admitting: Internal Medicine

## 2013-11-03 ENCOUNTER — Other Ambulatory Visit: Payer: Self-pay | Admitting: General Practice

## 2013-11-03 MED ORDER — WARFARIN SODIUM 2 MG PO TABS: ORAL_TABLET | ORAL | Status: DC

## 2013-11-08 ENCOUNTER — Encounter: Payer: Self-pay | Admitting: Internal Medicine

## 2013-11-21 ENCOUNTER — Ambulatory Visit (INDEPENDENT_AMBULATORY_CARE_PROVIDER_SITE_OTHER): Payer: 59 | Admitting: Internal Medicine

## 2013-11-21 ENCOUNTER — Ambulatory Visit (INDEPENDENT_AMBULATORY_CARE_PROVIDER_SITE_OTHER): Payer: 59 | Admitting: General Practice

## 2013-11-21 ENCOUNTER — Encounter: Payer: Self-pay | Admitting: Internal Medicine

## 2013-11-21 VITALS — BP 180/100 | HR 60 | Temp 97.9°F | Ht 65.5 in | Wt 176.0 lb

## 2013-11-21 DIAGNOSIS — Z7901 Long term (current) use of anticoagulants: Secondary | ICD-10-CM

## 2013-11-21 DIAGNOSIS — Z23 Encounter for immunization: Secondary | ICD-10-CM

## 2013-11-21 DIAGNOSIS — R739 Hyperglycemia, unspecified: Secondary | ICD-10-CM

## 2013-11-21 DIAGNOSIS — I10 Essential (primary) hypertension: Secondary | ICD-10-CM

## 2013-11-21 DIAGNOSIS — I4891 Unspecified atrial fibrillation: Secondary | ICD-10-CM

## 2013-11-21 DIAGNOSIS — Z5181 Encounter for therapeutic drug level monitoring: Secondary | ICD-10-CM

## 2013-11-21 DIAGNOSIS — R7309 Other abnormal glucose: Secondary | ICD-10-CM

## 2013-11-21 LAB — BASIC METABOLIC PANEL
BUN: 20 mg/dL (ref 6–23)
CALCIUM: 9.3 mg/dL (ref 8.4–10.5)
CO2: 30 mEq/L (ref 19–32)
Chloride: 100 mEq/L (ref 96–112)
Creatinine, Ser: 1.2 mg/dL (ref 0.4–1.5)
GFR: 65.09 mL/min (ref >60.00)
GLUCOSE: 156 mg/dL — AB (ref 70–99)
Potassium: 4.3 mEq/L (ref 3.5–5.1)
SODIUM: 135 meq/L (ref 135–145)

## 2013-11-21 LAB — HEMOGLOBIN A1C: Hgb A1c MFr Bld: 8 % — ABNORMAL HIGH (ref 4.6–6.5)

## 2013-11-21 LAB — POCT INR: INR: 2.5

## 2013-11-21 MED ORDER — ATORVASTATIN CALCIUM 20 MG PO TABS: 20.0000 mg | ORAL_TABLET | Freq: Every day | ORAL | Status: DC

## 2013-11-21 MED ORDER — PNEUMOCOCCAL 13-VAL CONJ VACC IM SUSP: 0.5000 mL | INTRAMUSCULAR | Status: DC

## 2013-11-21 MED ORDER — IRBESARTAN 300 MG PO TABS: 300.0000 mg | ORAL_TABLET | Freq: Every day | ORAL | Status: DC

## 2013-11-21 NOTE — Patient Instructions (Signed)
The patient is instructed to continue all medications as prescribed. Schedule followup with check out clerk upon leaving the clinic  

## 2013-11-21 NOTE — Progress Notes (Signed)
Pre visit review using our clinic review tool, if applicable. No additional management support is needed unless otherwise documented below in the visit note. 

## 2013-11-21 NOTE — Addendum Note (Signed)
Addended by: Peter Congo T on: 11/21/2013 11:01 AM   Modules accepted: Orders

## 2013-11-21 NOTE — Progress Notes (Signed)
Subjective:    Patient ID: [name], male    DOB: 09/13/1931, 80 y.o.   MRN: [id number]  HPI Elevated blood pressure this AM Took medications Did not take the lasix due to travel recheck was elevated  170/100  Possible elevated glucoses  INRs stable  Change the Altace to irbesartin   prevnar today    Review of Systems Past Medical History  Diagnosis Date  . Cardiomyopathy, nonischemic   . Tachycardia induced cardiomyopathy   . LBBB (left bundle branch block)   . CHF (congestive heart failure)   . S/P ablation of atrial fibrillation   . Long term (current) use of anticoagulants   . Hypertension   . Thyroid disease   . S/P AVR (aortic valve replacement) and aortoplasty   . Defibrillator activation   . Hyperlipidemia   . Chronic renal insufficiency   . Aortic valve disease     History   Social History  . Marital Status: Married    Spouse Name: N/A    Number of Children: N/A  . Years of Education: N/A   Occupational History  . Not on file.   Social History Main Topics  . Smoking status: Former Smoker    Quit date: 08/25/1968  . Smokeless tobacco: Never Used  . Alcohol Use: No  . Drug Use: No  . Sexual Activity: Yes   Other Topics Concern  . Not on file   Social History Narrative  . No narrative on file    Past Surgical History  Procedure Laterality Date  . Aortic valve replacement    . Coronary artery bypass graft    . Pacmaker,defibr  2006  . Cardiac catheterization  12/04/04  . Aortic valve surgery  1992    St. Jude Valve    Family History  Problem Relation Age of Onset  . Heart disease Mother   . Lung disease Mother   . Heart attack Father   . Stroke Brother   . Hypertension Brother     Allergies  Allergen Reactions  . Amoxicillin     REACTION: unspecified  . Ezetimibe-Simvastatin     REACTION: unspecified  . Penicillins   . Sulfamethoxazole-Trimethoprim     REACTION: nausia    Current Outpatient Prescriptions on File  Prior to Visit  Medication Sig Dispense Refill  . aspirin 81 MG tablet Take 81 mg by mouth daily.        Marland Kitchen. atorvastatin (LIPITOR) 20 MG tablet Take 20 mg by mouth daily.      . calcium carbonate (OS-CAL) 600 MG TABS Take 1 tablet (600 mg total) by mouth daily.  60 tablet    . furosemide (LASIX) 40 MG tablet Take 1 tablet (40 mg total) by mouth daily.  30 tablet  11  . levothyroxine (SYNTHROID, LEVOTHROID) 100 MCG tablet TAKE 1 TABLET ONCE DAILY.  90 tablet  3  . metoprolol (LOPRESSOR) 50 MG tablet Take 1.5 tablets (75 mg total) by mouth 2 (two) times daily.  90 tablet  12  . nabumetone (RELAFEN) 500 MG tablet Take 1 tablet (500 mg total) by mouth 2 (two) times daily as needed for pain.  60 tablet  11  . potassium chloride SA (K-DUR,KLOR-CON) 20 MEQ tablet TAKE 1 TABLET ONCE DAILY.  30 tablet  0  . ramipril (ALTACE) 10 MG capsule TAKE 1 CAPSULE EVERY DAY.  90 capsule  3  . warfarin (COUMADIN) 2 MG tablet Take as directed by anticoagulation clinic  40 tablet  3   No current facility-administered medications on file prior to visit.    BP 180/100  Pulse 60  Temp(Src) 97.9 F (36.6 C) (Oral)  Ht 5' 5.5" (1.664 m)  Wt 176 lb (79.833 kg)  BMI 28.83 kg/m2       Objective:   Physical Exam  Constitutional: He appears well-developed and well-nourished.  HENT:  Head: Normocephalic and atraumatic.  Eyes: Conjunctivae are normal. Pupils are equal, round, and reactive to light.  Neck: Normal range of motion. Neck supple.  Cardiovascular: Normal rate and regular rhythm.   Murmur heard. Pulmonary/Chest: Effort normal and breath sounds normal.  Abdominal: Soft. Bowel sounds are normal.  Skin: Skin is warm and dry.          Assessment & Plan:  Change from altace to irbesartin  And add norvasc if does not respond bmet today Screen for DM with a1c due to hyperglycemia  scheduled lasix recommended    CBC die to long time use of coumadin  INR today

## 2013-11-22 LAB — CBC WITH DIFFERENTIAL/PLATELET
BASOS ABS: 0 10*3/uL (ref 0.0–0.1)
BASOS PCT: 0.7 % (ref 0.0–3.0)
EOS ABS: 0.2 10*3/uL (ref 0.0–0.7)
Eosinophils Relative: 3.7 % (ref 0.0–5.0)
HCT: 46.5 % (ref 39.0–52.0)
Hemoglobin: 15.6 g/dL (ref 13.0–17.0)
LYMPHS PCT: 17.6 % (ref 12.0–46.0)
Lymphs Abs: 1.2 10*3/uL (ref 0.7–4.0)
MCHC: 33.5 g/dL (ref 30.0–36.0)
MCV: 93.7 fl (ref 78.0–100.0)
MONO ABS: 0.6 10*3/uL (ref 0.1–1.0)
Monocytes Relative: 9.5 % (ref 3.0–12.0)
NEUTROS PCT: 68.5 % (ref 43.0–77.0)
Neutro Abs: 4.5 10*3/uL (ref 1.4–7.7)
Platelets: 99 10*3/uL — ABNORMAL LOW (ref 150.0–400.0)
RBC: 4.96 Mil/uL (ref 4.22–5.81)
RDW: 14.2 % (ref 11.5–14.6)
WBC: 6.6 10*3/uL (ref 4.5–10.5)

## 2013-11-29 ENCOUNTER — Other Ambulatory Visit: Payer: Self-pay | Admitting: Internal Medicine

## 2013-12-09 ENCOUNTER — Other Ambulatory Visit: Payer: Self-pay | Admitting: Internal Medicine

## 2014-01-02 ENCOUNTER — Ambulatory Visit: Payer: 59

## 2014-01-02 ENCOUNTER — Ambulatory Visit (INDEPENDENT_AMBULATORY_CARE_PROVIDER_SITE_OTHER): Payer: Medicare Other | Admitting: Family

## 2014-01-02 DIAGNOSIS — Z5181 Encounter for therapeutic drug level monitoring: Secondary | ICD-10-CM

## 2014-01-02 DIAGNOSIS — I4891 Unspecified atrial fibrillation: Secondary | ICD-10-CM

## 2014-01-02 LAB — POCT INR: INR: 2.8

## 2014-01-02 NOTE — Patient Instructions (Signed)
Continue to take 1 tablet daily except take 1 1/2 tablets on M/W/F.  Re-check in 6 weeks.  Anticoagulation Dose Instructions as of 01/02/2014     [name] Tue Wed Thu Fri Sat   New Dose 2 mg 3 mg 2 mg 3 mg 2 mg 3 mg 2 mg    Description       Continue to take 1 tablet daily except take 1 1/2 tablets on M/W/F.  Re-check in 6 weeks.

## 2014-01-09 ENCOUNTER — Other Ambulatory Visit: Payer: Self-pay | Admitting: Internal Medicine

## 2014-02-02 ENCOUNTER — Ambulatory Visit (INDEPENDENT_AMBULATORY_CARE_PROVIDER_SITE_OTHER): Payer: Medicare Other | Admitting: *Deleted

## 2014-02-02 DIAGNOSIS — I5022 Chronic systolic (congestive) heart failure: Secondary | ICD-10-CM

## 2014-02-02 DIAGNOSIS — I4891 Unspecified atrial fibrillation: Secondary | ICD-10-CM

## 2014-02-02 NOTE — Progress Notes (Signed)
Remote ICD transmission.   

## 2014-02-13 ENCOUNTER — Ambulatory Visit (INDEPENDENT_AMBULATORY_CARE_PROVIDER_SITE_OTHER): Payer: Medicare Other | Admitting: Family

## 2014-02-13 ENCOUNTER — Ambulatory Visit: Payer: 59

## 2014-02-13 DIAGNOSIS — Z5181 Encounter for therapeutic drug level monitoring: Secondary | ICD-10-CM

## 2014-02-13 LAB — POCT INR: INR: 2.8

## 2014-02-13 NOTE — Patient Instructions (Signed)
Continue to take 1 tablet daily except take 1 1/2 tablets on M/W/F.  Re-check in 6 weeks.  Anticoagulation Dose Instructions as of 02/13/2014     [name] Tue Wed Thu Fri Sat   New Dose 2 mg 3 mg 2 mg 3 mg 2 mg 3 mg 2 mg    Description       Continue to take 1 tablet daily except take 1 1/2 tablets on M/W/F.  Re-check in 6 weeks.

## 2014-02-14 LAB — MDC_IDC_ENUM_SESS_TYPE_REMOTE
HIGH POWER IMPEDANCE MEASURED VALUE: 61 Ohm
Lead Channel Impedance Value: 515 Ohm
Lead Channel Impedance Value: 571 Ohm
Lead Channel Impedance Value: 575 Ohm
Lead Channel Sensing Intrinsic Amplitude: 25 mV
Lead Channel Setting Pacing Amplitude: 2 V
Lead Channel Setting Pacing Amplitude: 2.5 V
Lead Channel Setting Pacing Pulse Width: 0.4 ms
Lead Channel Setting Pacing Pulse Width: 0.4 ms
MDC IDC MSMT LEADCHNL LV SENSING INTR AMPL: 25 mV
MDC IDC PG SERIAL: 480118
MDC IDC SET LEADCHNL LV SENSING SENSITIVITY: 1 mV
MDC IDC SET LEADCHNL RV SENSING SENSITIVITY: 0.5 mV
MDC IDC SET ZONE DETECTION INTERVAL: 250 ms
MDC IDC SET ZONE DETECTION INTERVAL: 353 ms
MDC IDC STAT BRADY RV PERCENT PACED: 99 %
Zone Setting Detection Interval: 300 ms

## 2014-03-01 ENCOUNTER — Encounter: Payer: Self-pay | Admitting: Cardiology

## 2014-03-07 ENCOUNTER — Encounter: Payer: Self-pay | Admitting: Internal Medicine

## 2014-03-18 ENCOUNTER — Other Ambulatory Visit: Payer: Self-pay | Admitting: Internal Medicine

## 2014-03-20 ENCOUNTER — Other Ambulatory Visit: Payer: Self-pay | Admitting: General Practice

## 2014-03-20 MED ORDER — WARFARIN SODIUM 2 MG PO TABS: ORAL_TABLET | ORAL | Status: DC

## 2014-03-27 ENCOUNTER — Ambulatory Visit: Payer: Medicare Other

## 2014-03-27 ENCOUNTER — Ambulatory Visit (INDEPENDENT_AMBULATORY_CARE_PROVIDER_SITE_OTHER): Payer: Medicare Other | Admitting: Family

## 2014-03-27 DIAGNOSIS — I4891 Unspecified atrial fibrillation: Secondary | ICD-10-CM

## 2014-03-27 DIAGNOSIS — I482 Chronic atrial fibrillation, unspecified: Secondary | ICD-10-CM

## 2014-03-27 DIAGNOSIS — Z5181 Encounter for therapeutic drug level monitoring: Secondary | ICD-10-CM

## 2014-03-27 LAB — POCT INR: INR: 2.6

## 2014-03-27 NOTE — Patient Instructions (Signed)
Continue to take 1 tablet daily except take 1 1/2 tablets on M/W/F.  Re-check in 6 weeks.  Anticoagulation Dose Instructions as of 03/27/2014     Glynis Smiles Tue Wed Thu Fri Sat   New Dose 2 mg 3 mg 2 mg 3 mg 2 mg 3 mg 2 mg    Description       Continue to take 1 tablet daily except take 1 1/2 tablets on M/W/F.  Re-check in 6 weeks.

## 2014-05-08 ENCOUNTER — Ambulatory Visit (INDEPENDENT_AMBULATORY_CARE_PROVIDER_SITE_OTHER): Payer: Medicare Other | Admitting: Family

## 2014-05-08 ENCOUNTER — Ambulatory Visit (INDEPENDENT_AMBULATORY_CARE_PROVIDER_SITE_OTHER): Payer: Medicare Other | Admitting: *Deleted

## 2014-05-08 DIAGNOSIS — I428 Other cardiomyopathies: Secondary | ICD-10-CM

## 2014-05-08 DIAGNOSIS — I4891 Unspecified atrial fibrillation: Secondary | ICD-10-CM

## 2014-05-08 DIAGNOSIS — Z5181 Encounter for therapeutic drug level monitoring: Secondary | ICD-10-CM

## 2014-05-08 DIAGNOSIS — I482 Chronic atrial fibrillation, unspecified: Secondary | ICD-10-CM

## 2014-05-08 LAB — MDC_IDC_ENUM_SESS_TYPE_REMOTE
Brady Statistic RV Percent Paced: 98 %
HighPow Impedance: 60 Ohm
Implantable Pulse Generator Serial Number: 480118
Lead Channel Impedance Value: 540 Ohm
Lead Channel Sensing Intrinsic Amplitude: 25 mV
Lead Channel Sensing Intrinsic Amplitude: 25 mV
Lead Channel Sensing Intrinsic Amplitude: 3.1 mV
Lead Channel Setting Pacing Amplitude: 2 V
Lead Channel Setting Sensing Sensitivity: 0.5 mV
Lead Channel Setting Sensing Sensitivity: 1 mV
MDC IDC MSMT LEADCHNL RA IMPEDANCE VALUE: 564 Ohm
MDC IDC MSMT LEADCHNL RV IMPEDANCE VALUE: 519 Ohm
MDC IDC SET LEADCHNL LV PACING PULSEWIDTH: 0.4 ms
MDC IDC SET LEADCHNL RV PACING AMPLITUDE: 2.5 V
MDC IDC SET LEADCHNL RV PACING PULSEWIDTH: 0.4 ms
MDC IDC SET ZONE DETECTION INTERVAL: 353 ms
MDC IDC STAT BRADY RA PERCENT PACED: 0 %
Zone Setting Detection Interval: 250 ms
Zone Setting Detection Interval: 300 ms

## 2014-05-08 LAB — POCT INR: INR: 3.5

## 2014-05-08 NOTE — Patient Instructions (Signed)
Hold Coumadin today. Continue to take 1 tablet daily except take 1 1/2 tablets on M/W/F.  Re-check in 4 weeks.  Anticoagulation Dose Instructions as of 05/08/2014     Glynis Smiles Tue Wed Thu Fri Sat   New Dose 2 mg 3 mg 2 mg 3 mg 2 mg 3 mg 2 mg    Description       Hold Coumadin today. Continue to take 1 tablet daily except take 1 1/2 tablets on M/W/F.  Re-check in 4 weeks.

## 2014-05-08 NOTE — Progress Notes (Signed)
Remote ICD transmission.   

## 2014-05-16 ENCOUNTER — Encounter: Payer: Self-pay | Admitting: Cardiology

## 2014-05-17 ENCOUNTER — Encounter: Payer: Self-pay | Admitting: Internal Medicine

## 2014-06-05 ENCOUNTER — Ambulatory Visit (INDEPENDENT_AMBULATORY_CARE_PROVIDER_SITE_OTHER): Payer: Medicare Other | Admitting: Family

## 2014-06-05 ENCOUNTER — Ambulatory Visit: Payer: Medicare Other

## 2014-06-05 DIAGNOSIS — I482 Chronic atrial fibrillation, unspecified: Secondary | ICD-10-CM

## 2014-06-05 DIAGNOSIS — Z5181 Encounter for therapeutic drug level monitoring: Secondary | ICD-10-CM

## 2014-06-05 LAB — POCT INR: INR: 2.3

## 2014-06-05 NOTE — Patient Instructions (Addendum)
Continue to take 1 tablet daily except take 1 1/2 tablets on M/W/F.  Re-check in 4 weeks.  Anticoagulation Dose Instructions as of 06/05/2014     [name] Tue Wed Thu Fri Sat   New Dose 2 mg 3 mg 2 mg 3 mg 2 mg 3 mg 2 mg    Description       Continue to take 1 tablet daily except take 1 1/2 tablets on M/W/F.  Re-check in 4 weeks.

## 2014-06-09 ENCOUNTER — Other Ambulatory Visit: Payer: Self-pay

## 2014-06-23 ENCOUNTER — Ambulatory Visit (INDEPENDENT_AMBULATORY_CARE_PROVIDER_SITE_OTHER): Payer: Medicare Other | Admitting: Family Medicine

## 2014-06-23 ENCOUNTER — Encounter: Payer: Self-pay | Admitting: Family Medicine

## 2014-06-23 VITALS — BP 160/96 | HR 64 | Temp 98.0°F | Wt 173.0 lb

## 2014-06-23 DIAGNOSIS — E034 Atrophy of thyroid (acquired): Secondary | ICD-10-CM

## 2014-06-23 DIAGNOSIS — IMO0002 Reserved for concepts with insufficient information to code with codable children: Secondary | ICD-10-CM

## 2014-06-23 DIAGNOSIS — E1165 Type 2 diabetes mellitus with hyperglycemia: Secondary | ICD-10-CM

## 2014-06-23 DIAGNOSIS — E785 Hyperlipidemia, unspecified: Secondary | ICD-10-CM

## 2014-06-23 DIAGNOSIS — I10 Essential (primary) hypertension: Secondary | ICD-10-CM

## 2014-06-23 DIAGNOSIS — E038 Other specified hypothyroidism: Secondary | ICD-10-CM

## 2014-06-23 DIAGNOSIS — N182 Chronic kidney disease, stage 2 (mild): Secondary | ICD-10-CM

## 2014-06-23 DIAGNOSIS — Z23 Encounter for immunization: Secondary | ICD-10-CM

## 2014-06-23 MED ORDER — AMLODIPINE BESYLATE 5 MG PO TABS: 5.0000 mg | ORAL_TABLET | Freq: Every day | ORAL | Status: DC

## 2014-06-23 NOTE — Progress Notes (Signed)
Tana ConchStephen Cina Klumpp, MD Phone: [phone]  Subjective:  Patient presents today to establish care with me as their new primary care provider. Patient was formerly a patient of Dr. Lovell SheehanJenkins. Chief complaint-noted.   DIABETES Type II-reasonable control given age. New diagnosis-patient never informed despite previous a1c elevations.   Lab Results  Component Value Date   HGBA1C 7.8* 06/23/2014   HGBA1C 8.0* 11/21/2013   HGBA1C 6.3 07/27/2012   Medications taking and tolerating-no medications Blood Sugars per patient-does not check Diet-many sweets Regular Exercise-limited due to CHF On Aspirin-yes due to CAD On statin-yes due to hyperlipidemia and CAD Daily foot monitoring-no, advised  ROS- Denies Polyuria,Polydipsia, nocturia, Vision changes. Denies Hypoglycemia symptoms (shaky, sweaty, hungry, weak anxious, tremor, palpitations, confusion, behavior change).   Hypertension-continued poor control BP Readings from Last 3 Encounters:  06/23/14 160/96  11/21/13 180/100  11/01/13 142/85  Home BP monitoring-no but wife is a former Engineer, civil (consulting)nurse and can check Compliant with medications-yes without side effects-irbesartan, metoprolol, Lasix ROS-Denies any CP, HA. SOB up stairs or with hills  Hyperlipidemia-well-controlled LDL less than 70 today by direct LDL  On statin: Atorvastatin 20 mg ROS- no chest pain. No myalgias  The following were reviewed and entered/updated in epic: Past Medical History  Diagnosis Date  . Cardiomyopathy, nonischemic   . Tachycardia induced cardiomyopathy   . LBBB (left bundle branch block)   . CHF (congestive heart failure)   . S/P ablation of atrial fibrillation   . Long term (current) use of anticoagulants   . Hypertension   . Thyroid disease   . S/P AVR (aortic valve replacement) and aortoplasty   . Defibrillator activation   . Hyperlipidemia   . Chronic renal insufficiency   . Aortic valve disease    Patient Active Problem List   Diagnosis Date Noted  .  Encounter for therapeutic drug monitoring 10/06/2013  . CHRONIC SYSTOLIC HEART FAILURE 09/25/2009  . AUTOMATIC IMPLANTABLE CARDIAC DEFIBRILLATOR SITU 09/25/2009  . BACK PAIN, CHRONIC, INTERMITTENT 08/10/2009  . CELLULITIS AND ABSCESS OF UPPER ARM AND FOREARM 12/11/2008  . COUGH 09/18/2008  . RENAL INSUFFICIENCY, CHRONIC 12/07/2007  . DYSMETABOLIC SYNDROME 07/15/2007  . OSTEOARTHROSIS, LOCAL NOS, LOWER LEG 04/13/2007  . DIVERTICULOSIS OF COLON 03/31/2007  . HYPOTHYROIDISM 02/12/2007  . HYPERLIPIDEMIA 02/12/2007  . HYPERTENSION 02/12/2007  . ATRIAL FIBRILLATION 02/12/2007   Past Surgical History  Procedure Laterality Date  . Aortic valve replacement    . Coronary artery bypass graft    . Pacmaker,defibr  2006  . Cardiac catheterization  12/04/04  . Aortic valve surgery  1992    St. Jude Valve    Family History  Problem Relation Age of Onset  . Heart disease Mother   . Lung disease Mother   . Heart attack Father   . Stroke Brother   . Hypertension Brother     Medications- reviewed and updated Current Outpatient Prescriptions  Medication Sig Dispense Refill  . aspirin 81 MG tablet Take 81 mg by mouth daily.        Marland Kitchen. atorvastatin (LIPITOR) 20 MG tablet Take 1 tablet (20 mg total) by mouth daily.  30 tablet  11  . calcium carbonate (OS-CAL) 600 MG TABS Take 1 tablet (600 mg total) by mouth daily.  60 tablet    . furosemide (LASIX) 40 MG tablet TAKE 1 TABLET ONCE DAILY.  30 tablet  11  . irbesartan (AVAPRO) 300 MG tablet Take 1 tablet (300 mg total) by mouth daily.  30 tablet  11  .  levothyroxine (SYNTHROID, LEVOTHROID) 100 MCG tablet TAKE 1 TABLET ONCE DAILY.  90 tablet  3  . metoprolol (LOPRESSOR) 50 MG tablet TAKE 1&1/2 TABLETS TWICE DAILY.  90 tablet  5  . nabumetone (RELAFEN) 500 MG tablet Take 1 tablet (500 mg total) by mouth 2 (two) times daily as needed for pain.  60 tablet  11  . potassium chloride SA (K-DUR,KLOR-CON) 20 MEQ tablet TAKE 1 TABLET ONCE DAILY.  30 tablet   11  . warfarin (COUMADIN) 2 MG tablet Take as directed by anticoagulation clinic  40 tablet  3   No current facility-administered medications for this visit.      Allergies-reviewed and updated Allergies  Allergen Reactions  . Amoxicillin     REACTION: unspecified  . Ezetimibe-Simvastatin     REACTION: unspecified  . Penicillins   . Sulfamethoxazole-Trimethoprim     REACTION: nausia    History   Social History  . Marital Status: Married    Spouse Name: N/A    Number of Children: N/A  . Years of Education: N/A   Social History Main Topics  . Smoking status: Former Smoker    Quit date: 08/25/1968  . Smokeless tobacco: Never Used  . Alcohol Use: No  . Drug Use: No  . Sexual Activity: Yes   Other Topics Concern  . None   Social History Narrative  . None    ROS--See HPI   Objective: BP 160/96  Temp(Src) 98 F (36.7 C)  Wt 173 lb (78.472 kg) Gen: NAD, resting comfortably in chair HEENT: Mucous membranes are moist. Oropharynx normal. Good dentition-no dentures, 1 partial Neck: no thyromegaly CV: RRR no murmurs rubs or gallops Lungs: CTAB no crackles, wheeze, rhonchi Abdomen: soft/nontender/nondistended Ext: no edema Skin: warm, dry, no rash, dry skin on feet but no callous Neuro: grossly normal, moves all extremities  Dm foot exam normal  Assessment/Plan:  Diabetes mellitus type II, uncontrolled I informed patient he has had diabetes for the last 4 years. He has had reasonable control without medication. We set a joint goal of an A1c of less than 8 due to Age and comorbidity. We discussed a low dose metformin if A1c is above 8. We can recheck every 3 months. Patient knows the moderate sweets he may have some improved control as well which we'll keep him off medication.  Essential hypertension Poor control. We will add amlodipine 5 mg and follow-up in about a week. He has been poorly controlled for some time especially with known CAD and now diabetes we  need to try to keep them less than 140/90 as long as he tolerates this  Hyperlipidemia LDL is less than 70. We will continue atorvastatin at current dose   Orders Placed This Encounter  Procedures  . CBC with Differential  . Hemoglobin A1c  . TSH  . Comprehensive metabolic panel  . LDL Cholesterol, Direct    Meds ordered this encounter  Medications  . amLODipine (NORVASC) 5 MG tablet    Sig: Take 1 tablet (5 mg total) by mouth daily.    Dispense:  30 tablet    Refill:  5

## 2014-06-23 NOTE — Patient Instructions (Addendum)
You have diabetes. We are checking your level today. We will consider a medication if your # is above 8.   Start amlodipine 5mg . See me back in 1 week or so for recheck.   Health Maintenance Due  Topic Date Due  . Foot Exam -today looked great 04/02/1944  . Ophthalmology Exam -upcoming in February, make sure to tell them you have diabetes 04/02/1944  . Tetanus/tdap -at your next visit with me 04/02/1953  . Zostavax/shingles-call your insurance 04/02/1994  . Pneumococcal - in 1 month or so after an INR check , schedule nurse visit for injection 04/03/1999  . Influenza Vaccine -today 03/25/2014  . Hemoglobin A1c - today 05/24/2014

## 2014-06-24 DIAGNOSIS — I2510 Atherosclerotic heart disease of native coronary artery without angina pectoris: Secondary | ICD-10-CM | POA: Insufficient documentation

## 2014-06-24 LAB — CBC WITH DIFFERENTIAL/PLATELET
Basophils Absolute: 0.1 10*3/uL (ref 0.0–0.1)
Basophils Relative: 1 % (ref 0–1)
EOS ABS: 0.5 10*3/uL (ref 0.0–0.7)
EOS PCT: 8 % — AB (ref 0–5)
HEMATOCRIT: 45.3 % (ref 39.0–52.0)
Hemoglobin: 15.4 g/dL (ref 13.0–17.0)
Lymphocytes Relative: 24 % (ref 12–46)
Lymphs Abs: 1.6 10*3/uL (ref 0.7–4.0)
MCH: 30.9 pg (ref 26.0–34.0)
MCHC: 34 g/dL (ref 30.0–36.0)
MCV: 91 fL (ref 78.0–100.0)
Monocytes Absolute: 0.6 10*3/uL (ref 0.1–1.0)
Monocytes Relative: 9 % (ref 3–12)
Neutro Abs: 3.9 10*3/uL (ref 1.7–7.7)
Neutrophils Relative %: 58 % (ref 43–77)
PLATELETS: 140 10*3/uL — AB (ref 150–400)
RBC: 4.98 MIL/uL (ref 4.22–5.81)
RDW: 14.6 % (ref 11.5–15.5)
WBC: 6.7 10*3/uL (ref 4.0–10.5)

## 2014-06-24 LAB — TSH: TSH: 0.838 u[IU]/mL (ref 0.350–4.500)

## 2014-06-24 LAB — COMPREHENSIVE METABOLIC PANEL
ALBUMIN: 4.1 g/dL (ref 3.5–5.2)
ALT: 13 U/L (ref 0–53)
AST: 19 U/L (ref 0–37)
Alkaline Phosphatase: 75 U/L (ref 39–117)
BUN: 23 mg/dL (ref 6–23)
CALCIUM: 9.3 mg/dL (ref 8.4–10.5)
CO2: 29 meq/L (ref 19–32)
Chloride: 100 mEq/L (ref 96–112)
Creat: 1.2 mg/dL (ref 0.50–1.35)
GLUCOSE: 145 mg/dL — AB (ref 70–99)
POTASSIUM: 4.2 meq/L (ref 3.5–5.3)
Sodium: 138 mEq/L (ref 135–145)
TOTAL PROTEIN: 7.5 g/dL (ref 6.0–8.3)
Total Bilirubin: 0.7 mg/dL (ref 0.2–1.2)

## 2014-06-24 LAB — LDL CHOLESTEROL, DIRECT: Direct LDL: 61 mg/dL

## 2014-06-24 LAB — HEMOGLOBIN A1C
Hgb A1c MFr Bld: 7.8 % — ABNORMAL HIGH (ref <5.7)
Mean Plasma Glucose: 177 mg/dL — ABNORMAL HIGH (ref <117)

## 2014-06-24 NOTE — Assessment & Plan Note (Signed)
LDL is less than 70. We will continue atorvastatin at current dose

## 2014-06-24 NOTE — Assessment & Plan Note (Signed)
Poor control. We will add amlodipine 5 mg and follow-up in about a week. He has been poorly controlled for some time especially with known CAD and now diabetes we need to try to keep them less than 140/90 as long as he tolerates this

## 2014-06-24 NOTE — Assessment & Plan Note (Addendum)
I informed patient he has had diabetes for the last 4 years. He has had reasonable control without medication. We set a joint goal of an A1c of less than 8 due to Age and comorbidity. We discussed a low dose metformin if A1c is above 8. We can recheck every 3 months. Patient knows the moderate sweets he may have some improved control as well which we'll keep him off medication.

## 2014-07-03 ENCOUNTER — Ambulatory Visit (INDEPENDENT_AMBULATORY_CARE_PROVIDER_SITE_OTHER): Payer: Medicare Other | Admitting: Family

## 2014-07-03 ENCOUNTER — Ambulatory Visit (INDEPENDENT_AMBULATORY_CARE_PROVIDER_SITE_OTHER): Payer: Medicare Other | Admitting: Family Medicine

## 2014-07-03 ENCOUNTER — Encounter: Payer: Self-pay | Admitting: Family Medicine

## 2014-07-03 VITALS — BP 126/84 | Temp 97.5°F | Wt 170.0 lb

## 2014-07-03 DIAGNOSIS — Z5181 Encounter for therapeutic drug level monitoring: Secondary | ICD-10-CM

## 2014-07-03 DIAGNOSIS — E1165 Type 2 diabetes mellitus with hyperglycemia: Secondary | ICD-10-CM

## 2014-07-03 DIAGNOSIS — I10 Essential (primary) hypertension: Secondary | ICD-10-CM

## 2014-07-03 DIAGNOSIS — I482 Chronic atrial fibrillation, unspecified: Secondary | ICD-10-CM

## 2014-07-03 DIAGNOSIS — IMO0002 Reserved for concepts with insufficient information to code with codable children: Secondary | ICD-10-CM

## 2014-07-03 DIAGNOSIS — Z23 Encounter for immunization: Secondary | ICD-10-CM

## 2014-07-03 LAB — POCT INR: INR: 2.9

## 2014-07-03 NOTE — Assessment & Plan Note (Signed)
Improved control with addition of amlodipine 5mg . Continue current regimen of Irbesartan, metoprolol, lasix, amlodipine.

## 2014-07-03 NOTE — Patient Instructions (Addendum)
Blood pressure looks great. Continue amlodipine.   No need for diabetes medicine. Glad you are cutting back on sweets. Check again in 3 months.   Health Maintenance Due  Topic Date Due  . Ophthalmology Exam -upcoming in February, make sure to tell them you have diabetes 04/02/1944  . Tetanus/tdap -at your next visit with me 04/02/1953  . Zostavax/shingles-call your insurance 04/02/1994  . Pneumococcal - today 04/03/1999

## 2014-07-03 NOTE — Progress Notes (Signed)
Tana ConchStephen Gawain Crombie, MD Phone: [phone]  Subjective:   [name] is a 80 y.o. year old very pleasant male patient who presents with the following:  Hypertension-well controlled with addition of amlodipine 5mg   BP Readings from Last 3 Encounters:  07/03/14 126/84  06/23/14 160/96  11/21/13 180/100  Home BP monitoring-yes from 109-142 systolic with diastolic well controlled over last 3 days Compliant with medications-yes without side effects ROS-Denies any CP, HA, SOB, blurry vision, LE edema. Marland Kitchen.   DIABETES Type II-reasonable control  Lab Results  Component Value Date   HGBA1C 7.8* 06/23/2014   HGBA1C 8.0* 11/21/2013   HGBA1C 6.3 07/27/2012  Did not get message at home about a1c. I counseled patient on meaning of a1c. We had made an agreement to not start medicine if a1c under 8. Patient has worked on cutting down on his sweets intakes which were heavy before.   ROS- Denies blurry vision or hypoglycemia symptoms.   Past Medical History- Patient Active Problem List   Diagnosis Date Noted  . CAD (coronary artery disease) 06/24/2014    Priority: High  . Diabetes mellitus type II, uncontrolled 06/23/2014    Priority: High  . Chronic systolic heart failure 09/25/2009    Priority: High  . Automatic implantable cardioverter-defibrillator in situ 09/25/2009    Priority: High  . Atrial fibrillation 02/12/2007    Priority: High  . CKD (chronic kidney disease), stage II 12/07/2007    Priority: Medium  . Hypothyroidism 02/12/2007    Priority: Medium  . Hyperlipidemia 02/12/2007    Priority: Medium  . Essential hypertension 02/12/2007    Priority: Medium  . Encounter for therapeutic drug monitoring 10/06/2013    Priority: Low  . Back pain, chronic 08/10/2009    Priority: Low  . Localized osteoarthrosis, lower leg 04/13/2007    Priority: Low   Medications- reviewed and updated Current Outpatient Prescriptions  Medication Sig Dispense Refill  . amLODipine (NORVASC) 5 MG  tablet Take 1 tablet (5 mg total) by mouth daily. 30 tablet 5  . aspirin 81 MG tablet Take 81 mg by mouth daily.      Marland Kitchen. atorvastatin (LIPITOR) 20 MG tablet Take 1 tablet (20 mg total) by mouth daily. 30 tablet 11  . calcium carbonate (OS-CAL) 600 MG TABS Take 1 tablet (600 mg total) by mouth daily. 60 tablet   . furosemide (LASIX) 40 MG tablet TAKE 1 TABLET ONCE DAILY. 30 tablet 11  . irbesartan (AVAPRO) 300 MG tablet Take 1 tablet (300 mg total) by mouth daily. 30 tablet 11  . levothyroxine (SYNTHROID, LEVOTHROID) 100 MCG tablet TAKE 1 TABLET ONCE DAILY. 90 tablet 3  . metoprolol (LOPRESSOR) 50 MG tablet TAKE 1&1/2 TABLETS TWICE DAILY. 90 tablet 5  . potassium chloride SA (K-DUR,KLOR-CON) 20 MEQ tablet TAKE 1 TABLET ONCE DAILY. 30 tablet 11  . warfarin (COUMADIN) 2 MG tablet Take as directed by anticoagulation clinic 40 tablet 3  . nabumetone (RELAFEN) 500 MG tablet Take 1 tablet (500 mg total) by mouth 2 (two) times daily as needed for pain. 60 tablet 11   No current facility-administered medications for this visit.    Objective: BP 126/84 mmHg  Temp(Src) 97.5 F (36.4 C)  Wt 170 lb (77.111 kg) Gen: NAD, resting comfortably in chair CV: RRR no murmurs rubs or gallops Lungs: CTAB no crackles, wheeze, rhonchi Ext: no edema Skin: warm, dry, slight bruise on right arm (flower pot fell and hit him) Neuro: grossly normal, moves all extremities   Assessment/Plan:  Diabetes mellitus type II, uncontrolled Informed patient of A1c at 7.8. We will continue with diet and exercise controlled. Patient has already cut back on sweets. Follow-up in 3 months with repeat a1c.   Essential hypertension Improved control with addition of amlodipine 5mg . Continue current regimen of Irbesartan, metoprolol, lasix, amlodipine.    Will need pneumovax in 1 year Orders Placed This Encounter  Procedures  . Pneumococcal conjugate vaccine 13-valent

## 2014-07-03 NOTE — Assessment & Plan Note (Signed)
Informed patient of A1c at 7.8. We will continue with diet and exercise controlled. Patient has already cut back on sweets. Follow-up in 3 months with repeat a1c.

## 2014-07-03 NOTE — Patient Instructions (Signed)
Continue to take 1 tablet daily except take 1 1/2 tablets on M/W/F.  Re-check in 4 weeks.  Anticoagulation Dose Instructions as of 07/03/2014      [name] Tue Wed Thu Fri Sat   New Dose 2 mg 3 mg 2 mg 3 mg 2 mg 3 mg 2 mg    Description        Continue to take 1 tablet daily except take 1 1/2 tablets on M/W/F.  Re-check in 4 weeks.

## 2014-07-06 ENCOUNTER — Telehealth: Payer: Self-pay | Admitting: Family Medicine

## 2014-07-06 MED ORDER — LEVOTHYROXINE SODIUM 100 MCG PO TABS: 100.0000 ug | ORAL_TABLET | Freq: Every day | ORAL | Status: DC

## 2014-07-06 NOTE — Telephone Encounter (Signed)
[facility] - [place], [place] - [address] Is requesting re-fill on levothyroxine (SYNTHROID, LEVOTHROID) 100 MCG tablet

## 2014-07-10 ENCOUNTER — Other Ambulatory Visit: Payer: Self-pay | Admitting: Internal Medicine

## 2014-07-11 ENCOUNTER — Other Ambulatory Visit: Payer: Self-pay

## 2014-07-11 MED ORDER — METOPROLOL TARTRATE 50 MG PO TABS: ORAL_TABLET | ORAL | Status: DC

## 2014-07-26 ENCOUNTER — Other Ambulatory Visit: Payer: Self-pay | Admitting: Internal Medicine

## 2014-07-31 ENCOUNTER — Ambulatory Visit (INDEPENDENT_AMBULATORY_CARE_PROVIDER_SITE_OTHER): Payer: Medicare Other | Admitting: Family

## 2014-07-31 DIAGNOSIS — Z5181 Encounter for therapeutic drug level monitoring: Secondary | ICD-10-CM

## 2014-07-31 DIAGNOSIS — I482 Chronic atrial fibrillation, unspecified: Secondary | ICD-10-CM

## 2014-07-31 LAB — POCT INR: INR: 4.1

## 2014-07-31 NOTE — Patient Instructions (Signed)
Hold Coumadin today (Monday). Take 1/2 tab tomorrow (Tuesday). Then continue to take 1 tablet daily except take 1 1/2 tablets on M/W/F.  Re-check in 3 weeks.  Anticoagulation Dose Instructions as of 07/31/2014      [name] Tue Wed Thu Fri Sat   New Dose 2 mg 3 mg 2 mg 3 mg 2 mg 3 mg 2 mg    Description        Hold Coumadin today (Monday). Take 1/2 tab tomorrow (Tuesday). Then continue to take 1 tablet daily except take 1 1/2 tablets on M/W/F.  Re-check in 3 weeks.

## 2014-08-02 ENCOUNTER — Ambulatory Visit (INDEPENDENT_AMBULATORY_CARE_PROVIDER_SITE_OTHER): Payer: Medicare Other | Admitting: Family Medicine

## 2014-08-02 ENCOUNTER — Encounter: Payer: Self-pay | Admitting: Family Medicine

## 2014-08-02 VITALS — BP 104/70 | Temp 97.4°F | Wt 168.0 lb

## 2014-08-02 DIAGNOSIS — M545 Low back pain, unspecified: Secondary | ICD-10-CM

## 2014-08-02 DIAGNOSIS — W19XXXA Unspecified fall, initial encounter: Secondary | ICD-10-CM

## 2014-08-02 DIAGNOSIS — R2689 Other abnormalities of gait and mobility: Secondary | ICD-10-CM

## 2014-08-02 DIAGNOSIS — R29818 Other symptoms and signs involving the nervous system: Secondary | ICD-10-CM

## 2014-08-02 NOTE — Assessment & Plan Note (Addendum)
History of falls 3-4 per year.  Patient's gait is normal on exam today. He does exhibit some weakness with standing without using hands. He has canes available but does not use. He does have some mechanical falls but balance also an issue. STRONGLY advised patient to always use cane. Referred to PT for gait and balance training. Also ask for their opinion on rolling walker which I suspect they will advise. I doubt rhythm issues as patient transmitted information after falls (after each) for evaluation and has heard no report-also patient denies other issues around the time of falls such as chest pain/shortness of breath/dizziness. Follow up 6 weeks to reassess or sooner if further falls.

## 2014-08-02 NOTE — Patient Instructions (Signed)
Fall and balance issues  Recommend strongly that you use a cane at all times  Refer to physical therapy  Check in with me 6 weeks from now to see if any further falls/issues and how you are doing with cane. Ask PT if they think you need to use a rolling walker.

## 2014-08-02 NOTE — Assessment & Plan Note (Addendum)
Fall appeared mechanical. Had lumbar strain but this seems to have resolved. Return precautions given. Pain free today.

## 2014-08-02 NOTE — Progress Notes (Signed)
Tana ConchStephen Hunter, MD Phone: [phone]  Subjective:   [name] is a 80 y.o. year old very pleasant male patient who presents with the following:  Balance problems History of falls/fall risk  -Saturday December 5th went to eat breakfast. They had repaved in the lot and there was a 2 -2x4 sticking up and he tripped over them. No LOC. Did not hit his head. Gentleman helped him up and did not have immeidate pain other than on left hand and arm that he hit but mild. Fell down onto left arm/side of body. Sent in  Remote readings through pacemaker and was not called back for any abnormalities.   Patient was at home for 3 nights and had left sided back pain, feeling like spasms of moderate to severe intensity. Tylenol relieved pain. Pain in left side and low back up to 9/10. Today, pain is 1/10.  Started getting better over the last day.   Falls are not actually  a rare occurrence. Has perhaps 3-4 falls per year. Had a fall about a month ago specifically. Patient states typically it is due to him not paying attention to surroundings/mechanical fall. But he also admits to some balance difficulties on inclines or angled walkways. He admits to Difficulty standing without using hands at times.   Patient and wife are interested in PT. Patient not currenlty using cane or walker but has several canes at home.   ROS- no extremity weakness, no facial weakness, no headaches, no blurry vision  Past Medical History- Patient Active Problem List   Diagnosis Date Noted  . CAD (coronary artery disease) 06/24/2014    Priority: High  . Diabetes mellitus type II, uncontrolled 06/23/2014    Priority: High  . Chronic systolic heart failure 09/25/2009    Priority: High  . Automatic implantable cardioverter-defibrillator in situ 09/25/2009    Priority: High  . Atrial fibrillation 02/12/2007    Priority: High  . Balance problem 08/02/2014    Priority: Medium  . CKD (chronic kidney disease), stage II  12/07/2007    Priority: Medium  . Hypothyroidism 02/12/2007    Priority: Medium  . Hyperlipidemia 02/12/2007    Priority: Medium  . Essential hypertension 02/12/2007    Priority: Medium  . Encounter for therapeutic drug monitoring 10/06/2013    Priority: Low  . Back pain, chronic 08/10/2009    Priority: Low  . Localized osteoarthrosis, lower leg 04/13/2007    Priority: Low  . Accident due to mechanical fall without injury 08/02/2014   Medications- reviewed and updated Current Outpatient Prescriptions  Medication Sig Dispense Refill  . amLODipine (NORVASC) 5 MG tablet Take 1 tablet (5 mg total) by mouth daily. 30 tablet 5  . aspirin 81 MG tablet Take 81 mg by mouth daily.      Marland Kitchen. atorvastatin (LIPITOR) 20 MG tablet Take 1 tablet (20 mg total) by mouth daily. 30 tablet 11  . calcium carbonate (OS-CAL) 600 MG TABS Take 1 tablet (600 mg total) by mouth daily. 60 tablet   . furosemide (LASIX) 40 MG tablet TAKE 1 TABLET ONCE DAILY. 30 tablet 11  . irbesartan (AVAPRO) 300 MG tablet Take 1 tablet (300 mg total) by mouth daily. 30 tablet 11  . levothyroxine (SYNTHROID, LEVOTHROID) 100 MCG tablet Take 1 tablet (100 mcg total) by mouth daily before breakfast. 90 tablet 0  . metoprolol (LOPRESSOR) 50 MG tablet TAKE 1&1/2 TABLETS TWICE DAILY. 90 tablet 1  . potassium chloride SA (K-DUR,KLOR-CON) 20 MEQ tablet TAKE 1 TABLET  ONCE DAILY. 30 tablet 11  . warfarin (COUMADIN) 2 MG tablet TAKE AS DIRECTED. 40 tablet 1  . nabumetone (RELAFEN) 500 MG tablet Take 1 tablet (500 mg total) by mouth 2 (two) times daily as needed for pain. (Patient not taking: Reported on 08/02/2014) 60 tablet 11   No current facility-administered medications for this visit.    Objective: BP 104/70 mmHg  Temp(Src) 97.4 F (36.3 C)  Wt 168 lb (76.204 kg) Gen: NAD, resting comfortably CV: RRR no murmurs rubs or gallops Lungs: CTAB no crackles, wheeze, rhonchi MSK: left low back without pain to palpation.  Abdomen:  soft/nontender/nondistended/normal bowel sounds.  Skin: warm, dry, no rash Neuro: 5/5 strength lower extremities, intact distal sensation.  Able to stand without difficulty. Standing without using arms-falls backwards 1/2 attempts. Normal gait.    Assessment/Plan:  Balance problem History of falls 3-4 per year.  Patient's gait is normal on exam today. He does exhibit some weakness with standing without using hands. He has canes available but does not use. He does have some mechanical falls but balance also an issue. STRONGLY advised patient to always use cane. Referred to PT for gait and balance training. Also ask for their opinion on rolling walker which I suspect they will advise. I doubt rhythm issues as patient transmitted information after falls (after each) for evaluation and has heard no report-also patient denies other issues around the time of falls such as chest pain/shortness of breath/dizziness. Follow up 6 weeks to reassess or sooner if further falls.   Accident due to mechanical fall without injury Fall appeared mechanical. Had lumbar strain but this seems to have resolved. Return precautions given. Pain free today.    Orders Placed This Encounter  Procedures  . Ambulatory referral to Physical Therapy    Referral Priority:  Routine    Referral Type:  Physical Medicine    Referral Reason:  Specialty Services Required    Requested Specialty:  Physical Therapy    Number of Visits Requested:  1

## 2014-08-09 ENCOUNTER — Ambulatory Visit (INDEPENDENT_AMBULATORY_CARE_PROVIDER_SITE_OTHER): Payer: Medicare Other | Admitting: *Deleted

## 2014-08-09 ENCOUNTER — Encounter: Payer: Self-pay | Admitting: Internal Medicine

## 2014-08-09 DIAGNOSIS — Z95810 Presence of automatic (implantable) cardiac defibrillator: Secondary | ICD-10-CM

## 2014-08-09 LAB — MDC_IDC_ENUM_SESS_TYPE_REMOTE
Battery Remaining Longevity: 54 mo
Battery Remaining Percentage: 86 %
Brady Statistic RA Percent Paced: 0 %
HIGH POWER IMPEDANCE MEASURED VALUE: 55 Ohm
Lead Channel Pacing Threshold Amplitude: 1 V
Lead Channel Pacing Threshold Amplitude: 1.2 V
Lead Channel Pacing Threshold Pulse Width: 0.4 ms
Lead Channel Pacing Threshold Pulse Width: 0.4 ms
Lead Channel Sensing Intrinsic Amplitude: 0.5 mV
Lead Channel Sensing Intrinsic Amplitude: 25 mV
Lead Channel Setting Pacing Amplitude: 2 V
Lead Channel Setting Pacing Amplitude: 2.5 V
Lead Channel Setting Pacing Pulse Width: 0.4 ms
Lead Channel Setting Pacing Pulse Width: 0.4 ms
Lead Channel Setting Sensing Sensitivity: 0.5 mV
MDC IDC MSMT LEADCHNL LV IMPEDANCE VALUE: 526 Ohm
MDC IDC MSMT LEADCHNL RA IMPEDANCE VALUE: 540 Ohm
MDC IDC MSMT LEADCHNL RV IMPEDANCE VALUE: 503 Ohm
MDC IDC MSMT LEADCHNL RV SENSING INTR AMPL: 25 mV — AB
MDC IDC PG SERIAL: 480118
MDC IDC SESS DTM: 20151216131200
MDC IDC SET LEADCHNL LV SENSING SENSITIVITY: 1 mV
MDC IDC SET ZONE DETECTION INTERVAL: 300 ms
MDC IDC STAT BRADY RV PERCENT PACED: 98 %
Zone Setting Detection Interval: 250 ms
Zone Setting Detection Interval: 353 ms

## 2014-08-09 NOTE — Progress Notes (Signed)
Remote ICD transmission.   

## 2014-08-11 ENCOUNTER — Encounter: Payer: Self-pay | Admitting: Cardiology

## 2014-08-21 ENCOUNTER — Ambulatory Visit (INDEPENDENT_AMBULATORY_CARE_PROVIDER_SITE_OTHER): Payer: Medicare Other | Admitting: Family

## 2014-08-21 DIAGNOSIS — I482 Chronic atrial fibrillation, unspecified: Secondary | ICD-10-CM

## 2014-08-21 DIAGNOSIS — Z5181 Encounter for therapeutic drug level monitoring: Secondary | ICD-10-CM

## 2014-08-21 LAB — POCT INR: INR: 4.1

## 2014-08-21 NOTE — Patient Instructions (Signed)
Hold Coumadin today (12/28) and tomorrow (12/29). Then take 1 tablet daily except Wednesdays, 1 1/2 tablets.  Re-check in 2 weeks.  Anticoagulation Dose Instructions as of 08/21/2014      Glynis Smiles Tue Wed Thu Fri Sat   New Dose 2 mg 2 mg 2 mg 3 mg 2 mg 2 mg 2 mg    Description        Hold Coumadin today (12/28) and tomorrow (12/29). Then take 1 tablet daily except Wednesdays, 1 1/2 tablets.  Re-check in 2 weeks.

## 2014-08-23 ENCOUNTER — Ambulatory Visit: Payer: Medicare Other | Attending: Family Medicine

## 2014-08-23 DIAGNOSIS — R296 Repeated falls: Secondary | ICD-10-CM | POA: Insufficient documentation

## 2014-08-23 DIAGNOSIS — R269 Unspecified abnormalities of gait and mobility: Secondary | ICD-10-CM | POA: Insufficient documentation

## 2014-08-23 NOTE — Therapy (Signed)
[facility] [address] [place], [place], [address] Phone: [phone]   Fax:  [phone]  Physical Therapy Evaluation  Patient Details  Name: [name] MRN: [id number] Date of Birth: 09/29/1931  Encounter Date: 08/23/2014      PT End of Session - 08/23/14 1600    Visit Number 1   Number of Visits 17   Date for PT Re-Evaluation 10/22/14   Authorization Type G-code every 10th visit.   PT Start Time 1148   PT Stop Time 1231   PT Time Calculation (min) 43 min   Equipment Utilized During Treatment Gait belt   Activity Tolerance Patient tolerated treatment well   Behavior During Therapy WFL for tasks assessed/performed      Past Medical History  Diagnosis Date  . Cardiomyopathy, nonischemic   . Tachycardia induced cardiomyopathy   . LBBB (left bundle branch block)   . CHF (congestive heart failure)   . S/P ablation of atrial fibrillation   . Long term (current) use of anticoagulants   . Hypertension   . Thyroid disease   . S/P AVR (aortic valve replacement) and aortoplasty   . Defibrillator activation   . Hyperlipidemia   . Chronic renal insufficiency   . Aortic valve disease   . DIVERTICULOSIS OF COLON 03/31/2007    Qualifier: Diagnosis of  By: Creta LevinStallings CMA (AAMA), Robin    . Cellulitis and abscess of upper arm and forearm 12/11/2008    Qualifier: Diagnosis of  By: Lovell SheehanJenkins MD, Balinda QuailsJohn E     Past Surgical History  Procedure Laterality Date  . Coronary artery bypass graft    . Pacmaker,defibr  2006  . Cardiac catheterization  12/04/04  . Aortic valve surgery  1992    St. Jude Valve    There were no vitals taken for this visit.  Visit Diagnosis:  Frequent falls - Plan: PT plan of care cert/re-cert  Abnormality of gait - Plan: PT plan of care cert/re-cert      Subjective Assessment - 08/23/14 1200    Symptoms impaired balance, frequent falls   Pertinent History a-fib, aortic valve replacement,  pacemaker   Patient Stated Goals stable ambulation, stay upright and no falls, walk safely in yard without falling   Currently in Pain? No/denies          [facility] - 08/23/14 1202    Precautions   Precautions Fall   Restrictions   Weight Bearing Restrictions No   Balance Screen   Has the patient fallen in the past 6 months Yes   How many times? 4   Has the patient had a decrease in activity level because of a fear of falling?  Yes   Is the patient reluctant to leave their home because of a fear of falling?  No   Home Environment   Living Enviornment Private residence   Living Arrangements Spouse/significant other   Available Help at Discharge Family   Type of Home House   Home Access Stairs to enter   Entrance Stairs-Number of Steps 4  1 step without rail and 3 with rail   Entrance Stairs-Rails --  see # comment section   Home Layout One level   Home Equipment Cane - single point;Grab bars - tub/shower  pt occasionally uses SPC   Prior Function   Level of Independence Independent with basic ADLs;Independent with gait;Independent with transfers   Leisure going out to eat, likes to watch car racing, walking in  yard   Cognition   Overall Cognitive Status Within Functional Limits for tasks assessed   Observation/Other Assessments   Focus on Therapeutic Outcomes (FOTO)  ABC: 73.1%   Sensation   Light Touch Appears Intact   Coordination   Gross Motor Movements are Fluid and Coordinated Yes   Fine Motor Movements are Fluid and Coordinated Yes   Posture/Postural Control   Posture/Postural Control Postural limitations   Postural Limitations Forward head;Rounded Shoulders   AROM   Overall AROM  Within functional limits for tasks performed  except decreased L shoulder flexion due to bursitis.   Strength   Overall Strength Deficits   Overall Strength Comments B LE strength globally: 4/5, except L dorsiflexion 3+/5.   Transfers   Transfers Sit to Stand;Stand to Sit    Sit to Stand 5: Supervision;With upper extremity assist   Stand to Sit 5: Supervision;With upper extremity assist   Ambulation/Gait   Ambulation/Gait Yes   Ambulation/Gait Assistance 4: Min guard;5: Supervision   Ambulation/Gait Assistance Details Pt ambulated with and without SPC, no LOB noted.   Ambulation Distance (Feet) 150 Feet   Assistive device None;Straight cane   Gait Pattern Step-through pattern;Decreased dorsiflexion - left;Decreased dorsiflexion - right;Poor foot clearance - left  occasional difficulty clearing L foot, decr. trunk rotation   Gait velocity 2.41ft/sec.   Standardized Balance Assessment   Standardized Balance Assessment Timed Up and Go Test;Berg Balance Test   Berg Balance Test   Sit to Stand Able to stand without using hands and stabilize independently   Standing Unsupported Able to stand safely 2 minutes   Sitting with Back Unsupported but Feet Supported on Floor or Stool Able to sit safely and securely 2 minutes   Stand to Sit Sits safely with minimal use of hands   Transfers Able to transfer safely, minor use of hands   Standing Unsupported with Eyes Closed Able to stand 10 seconds with supervision   Standing Ubsupported with Feet Together Able to place feet together independently and stand for 1 minute with supervision   From Standing, Reach Forward with Outstretched Arm Can reach forward >12 cm safely (5")  8"   From Standing Position, Pick up Object from Floor Able to pick up shoe, needs supervision   From Standing Position, Turn to Look Behind Over each Shoulder Looks behind one side only/other side shows less weight shift   Turn 360 Degrees Able to turn 360 degrees safely but slowly   Standing Unsupported, Alternately Place Feet on Step/Stool Able to complete 4 steps without aid or supervision   Standing Unsupported, One Foot in Front Able to plae foot ahead of the other independently and hold 30 seconds   Standing on One Leg Tries to lift leg/unable  to hold 3 seconds but remains standing independently  2 seconds on R LE   Total Score 43   Timed Up and Go Test   TUG Normal TUG   Normal TUG (seconds) 9.94  without AD                          PT Education - 08/23/14 1600    Education provided Yes   Education Details Sequencing with [facility]   Person(s) Educated Patient;Spouse   Methods Explanation;Demonstration;Verbal cues   Comprehension Need further instruction;Verbalized understanding          PT Short Term Goals - 08/23/14 1602    PT SHORT TERM GOAL #1   Title Pt will  be independent in HEP. Target date: 09/20/14.   Status New   PT SHORT TERM GOAL #2   Title Pt will improve BERG balance score to >/=47/56. Target date: 09/20/14.   Status New   PT SHORT TERM GOAL #3   Title Pt will report no falls in last 2 weeks to improve safety. Target date: 09/20/14.   Status New   PT SHORT TERM GOAL #4   Title Pt will ambulate 300' over even/uneven terrain, with LRAD, with supervision to improve functional mobility so he can ambulate in his yard. Target date: 09/20/14.   Status New           PT Long Term Goals - 08-26-2014 1604    PT LONG TERM GOAL #1   Title Pt will verbalize understanding and agreement of fall prevention strategies to decrease falls risk. Target date: 10/18/14.   Status New   PT LONG TERM GOAL #2   Title Pt will improve BERG balance score to >/=51/56 to decrease falls risk. Target date: 10/18/14.   Status New   PT LONG TERM GOAL #3   Title Pt will improve ABC score to >10% to improve quality of life. Target date: 10/18/14.   Status New   PT LONG TERM GOAL #4   Title Pt will ambulate 500' over even/uneven terrain, with LRAD, at MOD I level to improve functional mobility. Target date: 10/18/14.   Status New   PT LONG TERM GOAL #5   Title Pt will report he was able to ambulate in his yard without falling to improve quality of life. Target date: 10/18/14.   Status New               Plan -  2014/08/26 1153    Clinical Impression Statement Pt is a pleasant 80y/o male presenting to OPPT neuro due to frequent falls. Pt reported last fall occurred (5-6 weeks ago) while out to eat, and tripped over 2x4 wooden beams and fell on chest/face; pt reported he was sore and was unable to get OOB that night to use bathroom. Pt made an appt. with Dr. Durene Cal, and MD reported he bruised his chest where pt's pacemaker is located. Pt's wife, Rosalita Chessman present during session.  Pt reported he cut lip and fractured R wrist in August due to a fall, but does not have restrictions.    Pt will benefit from skilled therapeutic intervention in order to improve on the following deficits Abnormal gait;Impaired flexibility;Decreased endurance;Decreased knowledge of use of DME;Decreased strength;Decreased mobility;Decreased balance   Rehab Potential Good   PT Frequency 2x / week   PT Duration 8 weeks   PT Treatment/Interventions ADLs/Self Care Home Management;Gait training;Neuromuscular re-education;Stair training;Functional mobility training;Biofeedback;Electrical Stimulation;Therapeutic activities;Therapeutic exercise;Manual techniques;Balance training;DME Instruction   PT Next Visit Plan Initiate balance HEP and functional strength HEP; gait training with SPC (sequencing).   Consulted and Agree with Plan of Care Patient          G-Codes - August 26, 2014 1612    Functional Assessment Tool Used BERG: 43/56; gait speed without AD: 2.28ft/sec; TUG without AD: 9.94sec.   Functional Limitation Mobility: Walking and moving around   Mobility: Walking and Moving Around Current Status [phone]) At least 20 percent but less than 40 percent impaired, limited or restricted   Mobility: Walking and Moving Around Goal Status [phone]) 0 percent impaired, limited or restricted       Problem List Patient Active Problem List   Diagnosis Date Noted  . Balance problem 08/02/2014  .  Accident due to mechanical fall without injury 08/02/2014   . CAD (coronary artery disease) 06/24/2014  . Diabetes mellitus type II, uncontrolled 06/23/2014  . Encounter for therapeutic drug monitoring 10/06/2013  . Chronic systolic heart failure 09/25/2009  . Automatic implantable cardioverter-defibrillator in situ 09/25/2009  . Back pain, chronic 08/10/2009  . CKD (chronic kidney disease), stage II 12/07/2007  . Localized osteoarthrosis, lower leg 04/13/2007  . Hypothyroidism 02/12/2007  . Hyperlipidemia 02/12/2007  . Essential hypertension 02/12/2007  . Atrial fibrillation 02/12/2007    Tynlee Bayle L 08/23/2014, 4:15 PM  [facility] [address] [place], [place], [address] Phone: [phone]   Fax:  [phone]   Zerita Boers, PT,DPT 08/23/2014 4:15 PM Phone: [phone] Fax: [phone]

## 2014-08-31 ENCOUNTER — Encounter: Payer: Self-pay | Admitting: Physical Therapy

## 2014-08-31 ENCOUNTER — Ambulatory Visit: Payer: Medicare Other | Attending: Family Medicine | Admitting: Physical Therapy

## 2014-08-31 DIAGNOSIS — R269 Unspecified abnormalities of gait and mobility: Secondary | ICD-10-CM | POA: Insufficient documentation

## 2014-08-31 DIAGNOSIS — R296 Repeated falls: Secondary | ICD-10-CM | POA: Diagnosis not present

## 2014-08-31 NOTE — Patient Instructions (Addendum)
HOLD COUNTER FOR SUPPORT WITH ALL EXERCISES EXCEPT FOR SITTING ANKLE EXERCISE.    Hip Extension (Standing)   Stand with support. Squeeze pelvic floor and hold. Move right leg backward with straight knee.  Repeat 10 times. Do 1 times a day. Repeat with other leg.    Tandem Stance   Right foot in front of left, heel touching toe both feet "straight ahead". Stand on Foot Triangle of Support with both feet. Balance in this position 10 seconds. Do with left foot in front of right. Repeat 3 times on each leg once a day.  Copyright  VHI. All rights reserved.  SINGLE LIMB STANCE   Stance: single leg on floor. Raise leg. Hold 10 seconds. Repeat with other leg. Do 3 times each leg once a day.  Copyright  VHI. All rights reserved.    Copyright  VHI. All rights reserved.  "I love a Parade" Lift   Using a chair if necessary, march in place. Repeat 10 times. Do 1 sessions per day.  http://gt2.exer.us/344   Copyright  VHI. All rights reserved.  AMBULATION: Side Step   Step sideways. Repeat in opposite direction. Repeat 4 times once a day.  Copyright  VHI. All rights reserved.  Ankle Plantar Flexion / Dorsiflexion, Standing   Stand while holding a stable object. Rise up on toes. Then rock back on heels. Hold each position 3 seconds. Repeat 10 times per session. Do 1 sessions per day.  Copyright  VHI. All rights reserved.  Backward Walking   Walk backward, toes of each foot coming down first. Take long, even strides. Make sure you have a clear pathway with no obstructions when you do this. Hold onto counter.  Repeat 4 times once a day.  Copyright  VHI. All rights reserved.  ANKLE: Dorsiflexion (Band)   Sit at edge of surface. Place band around top of foot. Keeping heel on floor, raise toes of banded foot. Hold 3 seconds. 10 reps per set once a day.  Copyright  VHI. All rights reserved.

## 2014-08-31 NOTE — Therapy (Signed)
[facility] [address] [place], [place], [address] Phone: [phone]   Fax:  [phone]  Physical Therapy Treatment  Patient Details  Name: [name] MRN: [id number] Date of Birth: 1931/09/03 Referring Provider:  Shelva Majestic, MD  Encounter Date: 08/31/2014      PT End of Session - 08/31/14 1235    Visit Number 2   Number of Visits 17   Date for PT Re-Evaluation 10/22/14   Authorization Type G-code every 10th visit.   PT Start Time 0845   PT Stop Time 0929   PT Time Calculation (min) 44 min   Activity Tolerance Patient tolerated treatment well   Behavior During Therapy [facility] for tasks assessed/performed      Past Medical History  Diagnosis Date  . Cardiomyopathy, nonischemic   . Tachycardia induced cardiomyopathy   . LBBB (left bundle branch block)   . CHF (congestive heart failure)   . S/P ablation of atrial fibrillation   . Long term (current) use of anticoagulants   . Hypertension   . Thyroid disease   . S/P AVR (aortic valve replacement) and aortoplasty   . Defibrillator activation   . Hyperlipidemia   . Chronic renal insufficiency   . Aortic valve disease   . DIVERTICULOSIS OF COLON 03/31/2007    Qualifier: Diagnosis of  By: Creta Levin CMA (AAMA), Robin    . Cellulitis and abscess of upper arm and forearm 12/11/2008    Qualifier: Diagnosis of  By: Lovell Sheehan MD, Balinda Quails     Past Surgical History  Procedure Laterality Date  . Coronary artery bypass graft    . Pacmaker,defibr  2006  . Cardiac catheterization  12/04/04  . Aortic valve surgery  1992    St. Jude Valve    There were no vitals taken for this visit.  Visit Diagnosis:  Frequent falls  Abnormality of gait      Subjective Assessment - 08/31/14 0848    Symptoms Denies falls or changes since last visit.   Currently in Pain? No/denies                    [facility] Adult PT Treatment/Exercise - 08/31/14 1223    Transfers    Transfers Sit to Stand;Stand to Sit   Sit to Stand 5: Supervision   Stand to Sit 5: Supervision   Ambulation/Gait   Ambulation/Gait Yes   Ambulation/Gait Assistance 5: Supervision   Ambulation/Gait Assistance Details Instructed pt on proper use/sequence of cane.  No LOB noted with ambulation.   Ambulation Distance (Feet) 110 Feet  3 times   Assistive device None;Straight cane   Gait Pattern Step-through pattern;Decreased dorsiflexion - left;Decreased dorsiflexion - right;Poor foot clearance - left   High Level Balance   High Level Balance Activities Side stepping;Backward walking;Other (comment)  at counter for UE support;Unable to maintain walking on heel   High Level Balance Comments single limb stance and tandem stand x 10 sec x 3 reps each side   Knee/Hip Exercises: Aerobic   Stationary Bike Scifit level 1.5 all 4 extremities x 12 minutes   Knee/Hip Exercises: Standing   Other Standing Knee Exercises standing at counter for bil heel raises, toe raises, hip extension, hip abd, marching x 10 reps each   Ankle Exercises: Seated   Other Seated Ankle Exercises dorsiflexion bil with green theraband x 10 each                PT Education -  08/31/14 1235    Education provided Yes   Education Details HEP. sequencing SPC   Person(s) Educated Patient;Spouse   Methods Explanation;Demonstration;Handout   Comprehension Verbalized understanding;Need further instruction          PT Short Term Goals - 08/23/14 1602    PT SHORT TERM GOAL #1   Title Pt will be independent in HEP. Target date: 09/20/14.   Status New   PT SHORT TERM GOAL #2   Title Pt will improve BERG balance score to >/=47/56. Target date: 09/20/14.   Status New   PT SHORT TERM GOAL #3   Title Pt will report no falls in last 2 weeks to improve safety. Target date: 09/20/14.   Status New   PT SHORT TERM GOAL #4   Title Pt will ambulate 300' over even/uneven terrain, with LRAD, with supervision to improve  functional mobility so he can ambulate in his yard. Target date: 09/20/14.   Status New           PT Long Term Goals - 08/23/14 1604    PT LONG TERM GOAL #1   Title Pt will verbalize understanding and agreement of fall prevention strategies to decrease falls risk. Target date: 10/18/14.   Status New   PT LONG TERM GOAL #2   Title Pt will improve BERG balance score to >/=51/56 to decrease falls risk. Target date: 10/18/14.   Status New   PT LONG TERM GOAL #3   Title Pt will improve ABC score to >10% to improve quality of life. Target date: 10/18/14.   Status New   PT LONG TERM GOAL #4   Title Pt will ambulate 500' over even/uneven terrain, with LRAD, at MOD I level to improve functional mobility. Target date: 10/18/14.   Status New   PT LONG TERM GOAL #5   Title Pt will report he was able to ambulate in his yard without falling to improve quality of life. Target date: 10/18/14.   Status New               Plan - 08/31/14 1236    Clinical Impression Statement Initiated HEP today.  Pt appears motivated to improve balance and mobility.  Continue PT per POC.   Pt will benefit from skilled therapeutic intervention in order to improve on the following deficits Abnormal gait;Impaired flexibility;Decreased endurance;Decreased knowledge of use of DME;Decreased strength;Decreased mobility;Decreased balance   Rehab Potential Good   PT Frequency 2x / week   PT Duration 8 weeks   PT Treatment/Interventions ADLs/Self Care Home Management;Gait training;Neuromuscular re-education;Stair training;Functional mobility training;Biofeedback;Electrical Stimulation;Therapeutic activities;Therapeutic exercise;Manual techniques;Balance training;DME Instruction   PT Next Visit Plan Review HEP.  Balance activities and gait.   Consulted and Agree with Plan of Care Patient;Family member/caregiver   Family Member Consulted wife-Suzanne        Problem List Patient Active Problem List   Diagnosis Date  Noted  . Balance problem 08/02/2014  . Accident due to mechanical fall without injury 08/02/2014  . CAD (coronary artery disease) 06/24/2014  . Diabetes mellitus type II, uncontrolled 06/23/2014  . Encounter for therapeutic drug monitoring 10/06/2013  . Chronic systolic heart failure 09/25/2009  . Automatic implantable cardioverter-defibrillator in situ 09/25/2009  . Back pain, chronic 08/10/2009  . CKD (chronic kidney disease), stage II 12/07/2007  . Localized osteoarthrosis, lower leg 04/13/2007  . Hypothyroidism 02/12/2007  . Hyperlipidemia 02/12/2007  . Essential hypertension 02/12/2007  . Atrial fibrillation 02/12/2007    Newell Coral 08/31/2014, 12:39 PM  [facility] [address] [place], [place], [address] Phone: [phone]   Fax:  [phone]     Newell Coral, [place] [facility] 08/31/2014 12:39 PM Phone: [phone] Fax: [phone]

## 2014-09-01 ENCOUNTER — Ambulatory Visit: Payer: Medicare Other

## 2014-09-01 DIAGNOSIS — R269 Unspecified abnormalities of gait and mobility: Secondary | ICD-10-CM | POA: Diagnosis not present

## 2014-09-01 DIAGNOSIS — R296 Repeated falls: Secondary | ICD-10-CM

## 2014-09-01 NOTE — Therapy (Signed)
[facility] [address] [place], [place], [address] Phone: [phone]   Fax:  [phone]  Physical Therapy Treatment  Patient Details  Name: [name] MRN: [id number] Date of Birth: Feb 04, 1932 Referring Provider:  Shelva Majestic, MD  Encounter Date: 09/01/2014      PT End of Session - 09/01/14 1625    Visit Number 3   Number of Visits 17   Date for PT Re-Evaluation 10/22/14   Authorization Type G-code every 10th visit.   PT Start Time 1400   PT Stop Time 1442   PT Time Calculation (min) 42 min   Equipment Utilized During Treatment Gait belt   Activity Tolerance Patient tolerated treatment well   Behavior During Therapy WFL for tasks assessed/performed      Past Medical History  Diagnosis Date  . Cardiomyopathy, nonischemic   . Tachycardia induced cardiomyopathy   . LBBB (left bundle branch block)   . CHF (congestive heart failure)   . S/P ablation of atrial fibrillation   . Long term (current) use of anticoagulants   . Hypertension   . Thyroid disease   . S/P AVR (aortic valve replacement) and aortoplasty   . Defibrillator activation   . Hyperlipidemia   . Chronic renal insufficiency   . Aortic valve disease   . DIVERTICULOSIS OF COLON 03/31/2007    Qualifier: Diagnosis of  By: Creta Levin CMA (AAMA), Robin    . Cellulitis and abscess of upper arm and forearm 12/11/2008    Qualifier: Diagnosis of  By: Lovell Sheehan MD, Balinda Quails     Past Surgical History  Procedure Laterality Date  . Coronary artery bypass graft    . Pacmaker,defibr  2006  . Cardiac catheterization  12/04/04  . Aortic valve surgery  1992    St. Jude Valve    There were no vitals taken for this visit.  Visit Diagnosis:  Frequent falls  Abnormality of gait      Subjective Assessment - 09/01/14 1402    Symptoms Pt denied falls or changes since last visit.    Pertinent History a-fib, aortic valve replacement, pacemaker   Patient  Stated Goals stable ambulation, stay upright and no falls, walk safely in yard without falling   Currently in Pain? No/denies                        Balance Exercises - 09/01/14 1406    OTAGO PROGRAM   Head Movements Sitting;5 reps   Neck Movements Sitting;5 reps   Back Extension Standing;5 reps   Trunk Movements Standing;5 reps   Ankle Movements 10 reps;Sitting   Knee Extensor 10 reps  10 reps without band, x10 with green theraband   Knee Flexor 10 reps  VC's to decr. hip flexion   Hip ABductor 10 reps  VC's for technique.   Ankle Plantorflexors 20 reps, support   Ankle Dorsiflexors 20 reps, support   Knee Bends 10 reps, no support   Backwards Walking Support  VC's to improve step length.   Walking and Turning Around No assistive device   Sideways Walking No assistive device   Tandem Stance 10 seconds, support   Tandem Walk Support   One Leg Stand 10 seconds, support   Heel Walking Support  VC's to improve R dorsiflexion   Toe Walk Support   Heel Toe Walking Backward --  with support x2.   Sit to Stand 10 reps, no support   Overall  OTAGO Comments VC's to improve eccentric control and technique.           PT Education - 09/01/14 1626    Education provided Yes   Education Details Initiated OTAGO HEP, PT educate pt on proper height for SPC.   Person(s) Educated Patient;Spouse   Methods Explanation;Demonstration;Tactile cues;Verbal cues   Comprehension Verbalized understanding;Returned demonstration;Need further instruction          PT Short Term Goals - 09/01/14 1628    PT SHORT TERM GOAL #1   Title Pt will be independent in HEP. Target date: 09/20/14.   Status On-going   PT SHORT TERM GOAL #2   Title Pt will improve BERG balance score to >/=47/56. Target date: 09/20/14.   Status On-going   PT SHORT TERM GOAL #3   Title Pt will report no falls in last 2 weeks to improve safety. Target date: 09/20/14.   Status On-going   PT SHORT TERM GOAL #4    Title Pt will ambulate 300' over even/uneven terrain, with LRAD, with supervision to improve functional mobility so he can ambulate in his yard. Target date: 09/20/14.   Status On-going           PT Long Term Goals - 09/01/14 1629    PT LONG TERM GOAL #1   Title Pt will verbalize understanding and agreement of fall prevention strategies to decrease falls risk. Target date: 10/18/14.   Status On-going   PT LONG TERM GOAL #2   Title Pt will improve BERG balance score to >/=51/56 to decrease falls risk. Target date: 10/18/14.   Status On-going   PT LONG TERM GOAL #3   Title Pt will improve ABC score to >10% to improve quality of life. Target date: 10/18/14.   Status On-going   PT LONG TERM GOAL #4   Title Pt will ambulate 500' over even/uneven terrain, with LRAD, at MOD I level to improve functional mobility. Target date: 10/18/14.   Status On-going   PT LONG TERM GOAL #5   Title Pt will report he was able to ambulate in his yard without falling to improve quality of life. Target date: 10/18/14.   Status On-going               Plan - 09/01/14 1626    Clinical Impression Statement Pt demonstrated progress towards goals, as he tolerated OTAGO HEP well and was able to perform exercises with and without UE support at counter. Pt continues to ambulate with decreased heel strike and requires VC's to correct. Pt would continue to benefit from skilled PT to improve safety during functional mobility.   Pt will benefit from skilled therapeutic intervention in order to improve on the following deficits Abnormal gait;Impaired flexibility;Decreased endurance;Decreased knowledge of use of DME;Decreased strength;Decreased mobility;Decreased balance   Rehab Potential Good   PT Frequency 2x / week   PT Duration 8 weeks   PT Treatment/Interventions ADLs/Self Care Home Management;Gait training;Neuromuscular re-education;Stair training;Functional mobility training;Biofeedback;Electrical  Stimulation;Therapeutic activities;Therapeutic exercise;Manual techniques;Balance training;DME Instruction   PT Next Visit Plan Finish OTAGO (stairs) and print copy for pt, gait training with and without SPC.   Consulted and Agree with Plan of Care Patient;Family member/caregiver   Family Member Consulted wife-[name]        Problem List Patient Active Problem List   Diagnosis Date Noted  . Balance problem 08/02/2014  . Accident due to mechanical fall without injury 08/02/2014  . CAD (coronary artery disease) 06/24/2014  . Diabetes mellitus type II, uncontrolled  06/23/2014  . Encounter for therapeutic drug monitoring 10/06/2013  . Chronic systolic heart failure 09/25/2009  . Automatic implantable cardioverter-defibrillator in situ 09/25/2009  . Back pain, chronic 08/10/2009  . CKD (chronic kidney disease), stage II 12/07/2007  . Localized osteoarthrosis, lower leg 04/13/2007  . Hypothyroidism 02/12/2007  . Hyperlipidemia 02/12/2007  . Essential hypertension 02/12/2007  . Atrial fibrillation 02/12/2007    Shelby Anderle L 09/01/2014, 4:30 PM   [facility] [address] [place], [place], [address] Phone: [phone]   Fax:  [phone]     Zerita Boers, PT,DPT 09/01/2014 4:30 PM Phone: [phone] Fax: [phone]

## 2014-09-04 ENCOUNTER — Ambulatory Visit (INDEPENDENT_AMBULATORY_CARE_PROVIDER_SITE_OTHER): Payer: Medicare Other | Admitting: Family

## 2014-09-04 DIAGNOSIS — I482 Chronic atrial fibrillation, unspecified: Secondary | ICD-10-CM

## 2014-09-04 DIAGNOSIS — Z5181 Encounter for therapeutic drug level monitoring: Secondary | ICD-10-CM

## 2014-09-04 LAB — POCT INR: INR: 2.1

## 2014-09-04 NOTE — Patient Instructions (Signed)
1 tablet daily except Wednesdays, 1 1/2 tablets.  Re-check in 4 weeks.  Anticoagulation Dose Instructions as of 09/04/2014      [name] Tue Wed Thu Fri Sat   New Dose 2 mg 2 mg 2 mg 3 mg 2 mg 2 mg 2 mg    Description        1 tablet daily except Wednesdays, 1 1/2 tablets.  Re-check in 4 weeks.

## 2014-09-05 ENCOUNTER — Encounter: Payer: Self-pay | Admitting: Physical Therapy

## 2014-09-05 ENCOUNTER — Ambulatory Visit: Payer: Medicare Other | Admitting: Physical Therapy

## 2014-09-05 DIAGNOSIS — R269 Unspecified abnormalities of gait and mobility: Secondary | ICD-10-CM

## 2014-09-05 DIAGNOSIS — R296 Repeated falls: Secondary | ICD-10-CM | POA: Diagnosis not present

## 2014-09-05 NOTE — Therapy (Signed)
[facility] [address] [place], [place], [address] Phone: [phone]   Fax:  [phone]  Physical Therapy Treatment  Patient Details  Name: [name] MRN: [id number] Date of Birth: November 06, 1931 Referring Provider:  Shelva Majestic, MD  Encounter Date: 09/05/2014      PT End of Session - 09/05/14 1704    Visit Number 4   Number of Visits 17   Date for PT Re-Evaluation 10/22/14   Authorization Type G-code every 10th visit.   PT Start Time 1150   PT Stop Time 1232   PT Time Calculation (min) 42 min   Equipment Utilized During Treatment Gait belt   Activity Tolerance Patient tolerated treatment well   Behavior During Therapy WFL for tasks assessed/performed      Past Medical History  Diagnosis Date  . Cardiomyopathy, nonischemic   . Tachycardia induced cardiomyopathy   . LBBB (left bundle branch block)   . CHF (congestive heart failure)   . S/P ablation of atrial fibrillation   . Long term (current) use of anticoagulants   . Hypertension   . Thyroid disease   . S/P AVR (aortic valve replacement) and aortoplasty   . Defibrillator activation   . Hyperlipidemia   . Chronic renal insufficiency   . Aortic valve disease   . DIVERTICULOSIS OF COLON 03/31/2007    Qualifier: Diagnosis of  By: Creta Levin CMA (AAMA), Robin    . Cellulitis and abscess of upper arm and forearm 12/11/2008    Qualifier: Diagnosis of  By: Lovell Sheehan MD, Balinda Quails     Past Surgical History  Procedure Laterality Date  . Coronary artery bypass graft    . Pacmaker,defibr  2006  . Cardiac catheterization  12/04/04  . Aortic valve surgery  1992    St. Jude Valve    There were no vitals taken for this visit.  Visit Diagnosis:  Frequent falls  Abnormality of gait      Subjective Assessment - 09/05/14 1154    Symptoms Pt denies falls or changes since last visit.  Has been trying to work on "planning ahead" when ambulating and looking for  potential hazards.   Currently in Pain? No/denies                    [facility] Adult PT Treatment/Exercise - 09/05/14 0001    Transfers   Transfers Sit to Stand;Stand to Sit   Sit to Stand 5: Supervision   Stand to Sit 5: Supervision   Ambulation/Gait   Ambulation/Gait Yes   Ambulation/Gait Assistance 5: Supervision   Ambulation/Gait Assistance Details no LOB   Ambulation Distance (Feet) 440 Feet   Assistive device None   Ambulation Surface Level   High Level Balance   High Level Balance Comments Rocker board both directions with head turns/nod, marching on foam, side stepping on foam, cone taps and tipping/uprighting, standing on ramp uphill and downhill stepping   Knee/Hip Exercises: Standing   Forward Step Up Left;1 set;10 reps;Hand Hold: 1;Step Height: 6"                  PT Short Term Goals - 09/01/14 1628    PT SHORT TERM GOAL #1   Title Pt will be independent in HEP. Target date: 09/20/14.   Status On-going   PT SHORT TERM GOAL #2   Title Pt will improve BERG balance score to >/=47/56. Target date: 09/20/14.   Status On-going   PT SHORT TERM  GOAL #3   Title Pt will report no falls in last 2 weeks to improve safety. Target date: 09/20/14.   Status On-going   PT SHORT TERM GOAL #4   Title Pt will ambulate 300' over even/uneven terrain, with LRAD, with supervision to improve functional mobility so he can ambulate in his yard. Target date: 09/20/14.   Status On-going           PT Long Term Goals - 09/01/14 1629    PT LONG TERM GOAL #1   Title Pt will verbalize understanding and agreement of fall prevention strategies to decrease falls risk. Target date: 10/18/14.   Status On-going   PT LONG TERM GOAL #2   Title Pt will improve BERG balance score to >/=51/56 to decrease falls risk. Target date: 10/18/14.   Status On-going   PT LONG TERM GOAL #3   Title Pt will improve ABC score to >10% to improve quality of life. Target date: 10/18/14.   Status On-going    PT LONG TERM GOAL #4   Title Pt will ambulate 500' over even/uneven terrain, with LRAD, at MOD I level to improve functional mobility. Target date: 10/18/14.   Status On-going   PT LONG TERM GOAL #5   Title Pt will report he was able to ambulate in his yard without falling to improve quality of life. Target date: 10/18/14.   Status On-going               Plan - 09/05/14 1705    Clinical Impression Statement Pt reports not having steps at home to perform stair walking.  Balance appears to be improving.  Discussed increasing ambulation outside of therapy.  Pt reports he is using his estationary bike at home.   Pt will benefit from skilled therapeutic intervention in order to improve on the following deficits Abnormal gait;Impaired flexibility;Decreased endurance;Decreased knowledge of use of DME;Decreased strength;Decreased mobility;Decreased balance   Rehab Potential Good   PT Frequency 2x / week   PT Duration 8 weeks   PT Treatment/Interventions ADLs/Self Care Home Management;Gait training;Neuromuscular re-education;Stair training;Functional mobility training;Biofeedback;Electrical Stimulation;Therapeutic activities;Therapeutic exercise;Manual techniques;Balance training;DME Instruction   PT Next Visit Plan Continue balance.  Gait outside depending on weather.   Consulted and Agree with Plan of Care Patient        Problem List Patient Active Problem List   Diagnosis Date Noted  . Balance problem 08/02/2014  . Accident due to mechanical fall without injury 08/02/2014  . CAD (coronary artery disease) 06/24/2014  . Diabetes mellitus type II, uncontrolled 06/23/2014  . Encounter for therapeutic drug monitoring 10/06/2013  . Chronic systolic heart failure 09/25/2009  . Automatic implantable cardioverter-defibrillator in situ 09/25/2009  . Back pain, chronic 08/10/2009  . CKD (chronic kidney disease), stage II 12/07/2007  . Localized osteoarthrosis, lower leg 04/13/2007  .  Hypothyroidism 02/12/2007  . Hyperlipidemia 02/12/2007  . Essential hypertension 02/12/2007  . Atrial fibrillation 02/12/2007    Newell Coral 09/05/2014, 5:08 PM  [facility] [address] [place], [place], [address] Phone: [phone]   Fax:  [phone]     Newell Coral, [place] [facility] 09/05/2014 5:08 PM Phone: [phone] Fax: [phone]

## 2014-09-06 ENCOUNTER — Other Ambulatory Visit: Payer: Self-pay | Admitting: Internal Medicine

## 2014-09-07 ENCOUNTER — Ambulatory Visit: Payer: Medicare Other | Admitting: Rehabilitative and Restorative Service Providers"

## 2014-09-07 DIAGNOSIS — R269 Unspecified abnormalities of gait and mobility: Secondary | ICD-10-CM

## 2014-09-07 DIAGNOSIS — R296 Repeated falls: Secondary | ICD-10-CM | POA: Diagnosis not present

## 2014-09-07 NOTE — Therapy (Signed)
[facility] [address] [place], [place], [address] Phone: [phone]   Fax:  [phone]  Physical Therapy Treatment  Patient Details  Name: [name] MRN: [id number] Date of Birth: 08/30/1931 Referring Provider:  Shelva Majestic, MD  Encounter Date: 09/07/2014      PT End of Session - 09/07/14 1440    Visit Number 5  G code 5   Number of Visits 17   Date for PT Re-Evaluation 10/22/14   Authorization Type G-code every 10th visit.   PT Start Time 0935   PT Stop Time 1020   PT Time Calculation (min) 45 min   Equipment Utilized During Treatment Gait belt   Activity Tolerance Patient tolerated treatment well   Behavior During Therapy WFL for tasks assessed/performed      Past Medical History  Diagnosis Date  . Cardiomyopathy, nonischemic   . Tachycardia induced cardiomyopathy   . LBBB (left bundle branch block)   . CHF (congestive heart failure)   . S/P ablation of atrial fibrillation   . Long term (current) use of anticoagulants   . Hypertension   . Thyroid disease   . S/P AVR (aortic valve replacement) and aortoplasty   . Defibrillator activation   . Hyperlipidemia   . Chronic renal insufficiency   . Aortic valve disease   . DIVERTICULOSIS OF COLON 03/31/2007    Qualifier: Diagnosis of  By: Creta Levin CMA (AAMA), Robin    . Cellulitis and abscess of upper arm and forearm 12/11/2008    Qualifier: Diagnosis of  By: Lovell Sheehan MD, Balinda Quails     Past Surgical History  Procedure Laterality Date  . Coronary artery bypass graft    . Pacmaker,defibr  2006  . Cardiac catheterization  12/04/04  . Aortic valve surgery  1992    St. Jude Valve    There were no vitals taken for this visit.  Visit Diagnosis:  Abnormality of gait      Subjective Assessment - 09/07/14 0940    Symptoms Pt doing HEP regularly and walking more.  No recent falls.   Currently in Pain? Yes   Pain Score --  "irritating" type pain,  not currently hurting, but limits mobility   Pain Location Knee   Pain Orientation Right   Pain Descriptors / Indicators Discomfort   Pain Onset More than a month ago     Gait: Ambulation without device with cues on longer stride length and bilateral heel strike adding cues for arm swing when appropriate x 400 ft nonstop x 3 repetitions. Direction changes forward/backwards with CGA. Gait with education on L handed use for SPC to provide R LE support.   NEUROMUSCULAR RE-EDUCATION: Standing balance activities on foam with UE movements (L side limited), head turns, eyes closed with CGA to supervision. Standing sidestepping activities emphasizing quad contraction to provide greater R knee stability. Alternating LE foot taps to 6" surface.  THERAPEUTIC EXERCISE: Supine R straight leg raise with tactile cues on quad control Long arc quads seated x 10 with tactile cues for quad control Hamstring stretching seated 2" step ups with bilateral UE support R and L x 10 reps Standing knee flexion x 10 reps bilaterally. STanding hip abduction x 10 reps bilaterally.                        PT Education - 09/07/14 1439    Education provided Yes   Education Details PT discussed benefit to  trying SPC in L hand to provide relief for R knee.  Recommended patient perform what feels safest until further practice in PT.   Person(s) Educated Patient;Spouse   Methods Explanation;Demonstration;Verbal cues   Comprehension Verbalized understanding;Returned demonstration;Need further instruction          PT Short Term Goals - 09/01/14 1628    PT SHORT TERM GOAL #1   Title Pt will be independent in HEP. Target date: 09/20/14.   Status On-going   PT SHORT TERM GOAL #2   Title Pt will improve BERG balance score to >/=47/56. Target date: 09/20/14.   Status On-going   PT SHORT TERM GOAL #3   Title Pt will report no falls in last 2 weeks to improve safety. Target date: 09/20/14.   Status  On-going   PT SHORT TERM GOAL #4   Title Pt will ambulate 300' over even/uneven terrain, with LRAD, with supervision to improve functional mobility so he can ambulate in his yard. Target date: 09/20/14.   Status On-going           PT Long Term Goals - 09/01/14 1629    PT LONG TERM GOAL #1   Title Pt will verbalize understanding and agreement of fall prevention strategies to decrease falls risk. Target date: 10/18/14.   Status On-going   PT LONG TERM GOAL #2   Title Pt will improve BERG balance score to >/=51/56 to decrease falls risk. Target date: 10/18/14.   Status On-going   PT LONG TERM GOAL #3   Title Pt will improve ABC score to >10% to improve quality of life. Target date: 10/18/14.   Status On-going   PT LONG TERM GOAL #4   Title Pt will ambulate 500' over even/uneven terrain, with LRAD, at MOD I level to improve functional mobility. Target date: 10/18/14.   Status On-going   PT LONG TERM GOAL #5   Title Pt will report he was able to ambulate in his yard without falling to improve quality of life. Target date: 10/18/14.   Status On-going               Plan - 09/07/14 1441    Clinical Impression Statement The patient had increased difficulty with R single limb stance activities and reports decreased confidence in the R knee stability and pain due to advanced arthritis.  PT performed some strengthening for R LE emphasizing knee stability and will f/u to determine if those activities increased soreness R LE.  Continue to progress balance and gait to patient tolerance.   PT Next Visit Plan Try SPC L hand to provide greater support for R knee; progress balance/gait training; outdoor ambulation; R knee stability activities.   Consulted and Agree with Plan of Care Patient;Family member/caregiver   Family Member Consulted wife-Suzanne        Problem List Patient Active Problem List   Diagnosis Date Noted  . Balance problem 08/02/2014  . Accident due to mechanical fall  without injury 08/02/2014  . CAD (coronary artery disease) 06/24/2014  . Diabetes mellitus type II, uncontrolled 06/23/2014  . Encounter for therapeutic drug monitoring 10/06/2013  . Chronic systolic heart failure 09/25/2009  . Automatic implantable cardioverter-defibrillator in situ 09/25/2009  . Back pain, chronic 08/10/2009  . CKD (chronic kidney disease), stage II 12/07/2007  . Localized osteoarthrosis, lower leg 04/13/2007  . Hypothyroidism 02/12/2007  . Hyperlipidemia 02/12/2007  . Essential hypertension 02/12/2007  . Atrial fibrillation 02/12/2007    Lorree Millar, PT 09/07/2014, 2:47 PM  [place]  [facility] [address] [place], [place], [address] Phone: [phone]   Fax:  [phone]

## 2014-09-11 ENCOUNTER — Encounter: Payer: Self-pay | Admitting: Physical Therapy

## 2014-09-11 ENCOUNTER — Ambulatory Visit: Payer: Medicare Other | Admitting: Physical Therapy

## 2014-09-11 DIAGNOSIS — R269 Unspecified abnormalities of gait and mobility: Secondary | ICD-10-CM | POA: Diagnosis not present

## 2014-09-11 DIAGNOSIS — R296 Repeated falls: Secondary | ICD-10-CM

## 2014-09-11 NOTE — Therapy (Signed)
[facility] [address] [place], [place], [address] Phone: [phone]   Fax:  [phone]  Physical Therapy Treatment  Patient Details  Name: [name] MRN: [id number] Date of Birth: 1932-03-08 Referring Provider:  Shelva Majestic, MD  Encounter Date: 09/11/2014      PT End of Session - 09/11/14 1238    Visit Number 6  G6   Number of Visits 17   Date for PT Re-Evaluation 10/22/14   Authorization Type G-code every 10th visit.   PT Start Time 1148   PT Stop Time 1230   PT Time Calculation (min) 42 min   Activity Tolerance Patient tolerated treatment well      Past Medical History  Diagnosis Date  . Cardiomyopathy, nonischemic   . Tachycardia induced cardiomyopathy   . LBBB (left bundle branch block)   . CHF (congestive heart failure)   . S/P ablation of atrial fibrillation   . Long term (current) use of anticoagulants   . Hypertension   . Thyroid disease   . S/P AVR (aortic valve replacement) and aortoplasty   . Defibrillator activation   . Hyperlipidemia   . Chronic renal insufficiency   . Aortic valve disease   . DIVERTICULOSIS OF COLON 03/31/2007    Qualifier: Diagnosis of  By: Creta Levin CMA (AAMA), Robin    . Cellulitis and abscess of upper arm and forearm 12/11/2008    Qualifier: Diagnosis of  By: Lovell Sheehan MD, Balinda Quails     Past Surgical History  Procedure Laterality Date  . Coronary artery bypass graft    . Pacmaker,defibr  2006  . Cardiac catheterization  12/04/04  . Aortic valve surgery  1992    St. Jude Valve    There were no vitals taken for this visit.  Visit Diagnosis:  Abnormality of gait  Frequent falls      Subjective Assessment - 09/11/14 1150    Symptoms Pt reports having minor headache this morning.  Have tried using cane in L hand, but I don't like it.   Pertinent History a-fib, aortic valve replacement, pacemaker   Currently in Pain? Yes   Pain Score --  irritating  hurt, doesn't rate on scale   Pain Location Shoulder  knee   Pain Orientation Left  L shoulder, R knee    Pain Descriptors / Indicators Discomfort   Pain Onset More than a month ago   Aggravating Factors  a lot of walking aggravates pain   Pain Relieving Factors unsure what alleviates     GAIT: Gait training using cane in L hand, with cues for cane placement, sequencing, 400 ft non-stop x 2 reps.  Pt also requires cues for posture and looking ahead versus looking down to floor. Discussed with patient practicing cane using L hand to offset pressure on R knee with gait.    Therapeutic Exercise: Seated hamstring stretch with lower extremities propped on 4 inch block 3 reps x 30 seconds each leg. LAQs bilateral lower extremity, 10 reps x 3 sec hold for quad control.  At 6 inch step-forward step ups-R x 9 reps, L x 10 reps, pt has decreased eccentric control with knee extension>knee flexion.  Standing hamstring curls x 10 reps each leg.  Sit<>stand from elevated mat surfaces, 2 sets x 10 reps with verbal and tactile cues for quad and glut activation.  Neuro Reeducation: On foam mat surface at counter-head turns x 10 then head nods x 10 reps with UE support;  marching in place x 10 reps, then forward kicks x 10 reps each leg, side kicks x 10 reps each leg, with gentle UE support.  Forward/back walking with intermittent UE support 3 x 10 ft, then sidestepping to R and L with UE support, with cues for foot clearance.                         PT Short Term Goals - 09/01/14 1628    PT SHORT TERM GOAL #1   Title Pt will be independent in HEP. Target date: 09/20/14.   Status On-going   PT SHORT TERM GOAL #2   Title Pt will improve BERG balance score to >/=47/56. Target date: 09/20/14.   Status On-going   PT SHORT TERM GOAL #3   Title Pt will report no falls in last 2 weeks to improve safety. Target date: 09/20/14.   Status On-going   PT SHORT TERM GOAL #4   Title Pt will ambulate  300' over even/uneven terrain, with LRAD, with supervision to improve functional mobility so he can ambulate in his yard. Target date: 09/20/14.   Status On-going           PT Long Term Goals - 09/01/14 1629    PT LONG TERM GOAL #1   Title Pt will verbalize understanding and agreement of fall prevention strategies to decrease falls risk. Target date: 10/18/14.   Status On-going   PT LONG TERM GOAL #2   Title Pt will improve BERG balance score to >/=51/56 to decrease falls risk. Target date: 10/18/14.   Status On-going   PT LONG TERM GOAL #3   Title Pt will improve ABC score to >10% to improve quality of life. Target date: 10/18/14.   Status On-going   PT LONG TERM GOAL #4   Title Pt will ambulate 500' over even/uneven terrain, with LRAD, at MOD I level to improve functional mobility. Target date: 10/18/14.   Status On-going   PT LONG TERM GOAL #5   Title Pt will report he was able to ambulate in his yard without falling to improve quality of life. Target date: 10/18/14.   Status On-going               Plan - 09/11/14 1238    Clinical Impression Statement Pt has difficulty with single limb activities, including forward step ups on RLE.  Pt is able to perform gait activities using cane in LE hand, with cues for pacing and sequencing with cane placement.  Pt feels that he has improved with balance and gait and reports he was hoping today would be his last therapy day.     Pt will benefit from skilled therapeutic intervention in order to improve on the following deficits Abnormal gait;Impaired flexibility;Decreased endurance;Decreased knowledge of use of DME;Decreased strength;Decreased mobility;Decreased balance   Rehab Potential Good   PT Frequency 2x / week   PT Duration 8 weeks   PT Treatment/Interventions ADLs/Self Care Home Management;Gait training;Neuromuscular re-education;Stair training;Functional mobility training;Biofeedback;Electrical Stimulation;Therapeutic  activities;Therapeutic exercise;Manual techniques;Balance training;DME Instruction   PT Next Visit Plan Compliant surface gait and balance activities with cane; have discussion with patient regarding remaining PT visits/POC; begin checking goals.   Consulted and Agree with Plan of Care Patient        Problem List Patient Active Problem List   Diagnosis Date Noted  . Balance problem 08/02/2014  . Accident due to mechanical fall without injury 08/02/2014  . CAD (coronary artery disease)  06/24/2014  . Diabetes mellitus type II, uncontrolled 06/23/2014  . Encounter for therapeutic drug monitoring 10/06/2013  . Chronic systolic heart failure 09/25/2009  . Automatic implantable cardioverter-defibrillator in situ 09/25/2009  . Back pain, chronic 08/10/2009  . CKD (chronic kidney disease), stage II 12/07/2007  . Localized osteoarthrosis, lower leg 04/13/2007  . Hypothyroidism 02/12/2007  . Hyperlipidemia 02/12/2007  . Essential hypertension 02/12/2007  . Atrial fibrillation 02/12/2007    Shiloh Southern W. 09/11/2014, 12:42 PM  Lonia Blood, PT 09/11/2014 12:43 PM Phone: [phone] Fax: [phone]   [facility] [address] [place], [place], [address] Phone: [phone]   Fax:  [phone]

## 2014-09-13 ENCOUNTER — Ambulatory Visit: Payer: Medicare Other | Admitting: Family Medicine

## 2014-09-14 ENCOUNTER — Encounter: Payer: Self-pay | Admitting: Physical Therapy

## 2014-09-14 ENCOUNTER — Ambulatory Visit: Payer: Medicare Other | Admitting: Physical Therapy

## 2014-09-14 DIAGNOSIS — R269 Unspecified abnormalities of gait and mobility: Secondary | ICD-10-CM

## 2014-09-14 DIAGNOSIS — R296 Repeated falls: Secondary | ICD-10-CM | POA: Diagnosis not present

## 2014-09-14 NOTE — Patient Instructions (Addendum)
Fall Prevention and Home SafetyIt is important to avoid accidents which may result in broken bones.  Here are a few ideas on how to make your home safer so you will be less likely to trip or fall.  1. Use nonskid mats or non slip strips in your shower or tub, on your bathroom floor and around sinks.  If you know that you have spilled water, wipe it up! 2. In the bathroom, it is important to have properly installed grab bars on the walls or on the edge of the tub.  Towel racks are NOT strong enough for you to hold onto or to pull on for support. 3. Stairs and hallways should have enough light.  Add lamps or night lights if you need ore light. 4. It is good to have handrails on both sides of the stairs if possible.  Always fix broken handrails right away. 5. It is important to see the edges of steps.  Paint the edges of outdoor steps white so you can see them better.  Put colored tape on the edge of inside steps. 6. Throw-rugs are dangerous because they can slide.  Removing the rugs is the best idea, but if they must stay, add adhesive carpet tape to prevent slipping. 7. Do not keep things on stairs or in the halls.  Remove small furniture that blocks the halls as it may cause you to trip.  Keep telephone and electrical cords out of the way where you walk. 8. Always were sturdy, rubber-soled shoes for good support.  Never wear just socks, especially on the stairs.  Socks may cause you to slip or fall.  Do not wear full-length housecoats as you can easily trip on the bottom.  9. Place the things you use the most on the shelves that are the easiest to reach.  If you use a stepstool, make sure it is in good condition.  If you feel unsteady, DO NOT climb, ask for help. If a health professional advises you to use a cane or walker, do not be ashamed.  These items can keep you from falling and breaking your bones. Falls cause injuries and can affect all age groups. It is possible to use preventive measures to  significantly decrease the likelihood of falls. There are many simple measures which can make your home safer and prevent falls. OUTDOORS 10. Repair cracks and edges of walkways and driveways. 11. Remove high doorway thresholds. 12. Trim shrubbery on the main path into your home. 13. Have good outside lighting. 14. Clear walkways of tools, rocks, debris, and clutter. 15. Check that handrails are not broken and are securely fastened. Both sides of steps should have handrails. 16. Have leaves, snow, and ice cleared regularly. 17. Use sand or salt on walkways during winter months. 18. In the garage, clean up grease or oil spills. BATHROOM  Install night lights.  Install grab bars by the toilet and in the tub and shower.  Use non-skid mats or decals in the tub or shower.  Place a plastic non-slip stool in the shower to sit on, if needed.  Keep floors dry and clean up all water on the floor immediately.  Remove soap buildup in the tub or shower on a regular basis.  Secure bath mats with non-slip, double-sided rug tape.  Remove throw rugs and tripping hazards from the floors. BEDROOMS  Install night lights.  Make sure a bedside light is easy to reach.  Do not use oversized bedding.  Keep  a telephone by your bedside.  Have a firm chair with side arms to use for getting dressed.  Remove throw rugs and tripping hazards from the floor. KITCHEN  Keep handles on pots and pans turned toward the center of the stove. Use back burners when possible.  Clean up spills quickly and allow time for drying.  Avoid walking on wet floors.  Avoid hot utensils and knives.  Position shelves so they are not too high or low.  Place commonly used objects within easy reach.  If necessary, use a sturdy step stool with a grab bar when reaching.  Keep electrical cables out of the way.  Do not use floor polish or wax that makes floors slippery. If you must use wax, use non-skid floor  wax.  Remove throw rugs and tripping hazards from the floor. STAIRWAYS  Never leave objects on stairs.  Place handrails on both sides of stairways and use them. Fix any loose handrails. Make sure handrails on both sides of the stairways are as long as the stairs.  Check carpeting to make sure it is firmly attached along stairs. Make repairs to worn or loose carpet promptly.  Avoid placing throw rugs at the top or bottom of stairways, or properly secure the rug with carpet tape to prevent slippage. Get rid of throw rugs, if possible.  Have an electrician put in a light switch at the top and bottom of the stairs. OTHER FALL PREVENTION TIPS  Wear low-heel or rubber-soled shoes that are supportive and fit well. Wear closed toe shoes.  When using a stepladder, make sure it is fully opened and both spreaders are firmly locked. Do not climb a closed stepladder.  Add color or contrast paint or tape to grab bars and handrails in your home. Place contrasting color strips on first and last steps.  Learn and use mobility aids as needed. Install an electrical emergency response system.  Turn on lights to avoid dark areas. Replace light bulbs that burn out immediately. Get light switches that glow.  Arrange furniture to create clear pathways. Keep furniture in the same place.  Firmly attach carpet with non-skid or double-sided tape.  Eliminate uneven floor surfaces.  Select a carpet pattern that does not visually hide the edge of steps.  Be aware of all pets. OTHER HOME SAFETY TIPS  Set the water temperature for 120 F (48.8 C).  Keep emergency numbers on or near the telephone.  Keep smoke detectors on every level of the home and near sleeping areas. Document Released: 08/01/2002 Document Revised: 02/10/2012 Document Reviewed: 10/31/2011 [facility] Patient Information 2015 [place], [place]. This information is not intended to replace advice given to you by your health care provider. Make  sure you discuss any questions you have with your health care provider.

## 2014-09-14 NOTE — Therapy (Signed)
[facility] [address] [place], [place], [address] Phone: [phone]   Fax:  [phone]  Physical Therapy Treatment  Patient Details  Name: [name] MRN: [id number] Date of Birth: [birth year] Referring Provider:  Marin Olp, MD  Encounter Date: 09/14/2014      PT End of Session - 09/14/14 1216    Visit Number 7   Number of Visits 17   Authorization Type G-code every 10th visit.   PT Start Time 0930   PT Stop Time 1014   PT Time Calculation (min) 44 min   Activity Tolerance Patient tolerated treatment well   Behavior During Therapy WFL for tasks assessed/performed      Past Medical History  Diagnosis Date  . Cardiomyopathy, nonischemic   . Tachycardia induced cardiomyopathy   . LBBB (left bundle branch block)   . CHF (congestive heart failure)   . S/P ablation of atrial fibrillation   . Long term (current) use of anticoagulants   . Hypertension   . Thyroid disease   . S/P AVR (aortic valve replacement) and aortoplasty   . Defibrillator activation   . Hyperlipidemia   . Chronic renal insufficiency   . Aortic valve disease   . DIVERTICULOSIS OF COLON 03/31/2007    Qualifier: Diagnosis of  By: Nolon Rod CMA (AAMA), Robin    . Cellulitis and abscess of upper arm and forearm 12/11/2008    Qualifier: Diagnosis of  By: Arnoldo Morale MD, Balinda Quails     Past Surgical History  Procedure Laterality Date  . Coronary artery bypass graft    . Pacmaker,defibr  2006  . Cardiac catheterization  12/04/04  . Aortic valve surgery  1992    St. Jude Valve    There were no vitals taken for this visit.  Visit Diagnosis:  Abnormality of gait  Frequent falls      Subjective Assessment - 09/14/14 0937    Symptoms Pt denies changes since last visit.  Was hoping to finish therapy today.  Feels like his balance is better.   Currently in Pain? No/denies                    [facility] Adult PT Treatment/Exercise  - 09/14/14 0001    Transfers   Transfers Sit to Stand;Stand to Sit   Sit to Stand 7: Independent   Stand to Sit 7: Independent   Ambulation/Gait   Ambulation/Gait Yes   Ambulation/Gait Assistance 6: Modified independent (Device/Increase time)   Ambulation/Gait Assistance Details no LOB   Ambulation Distance (Feet) 550 Feet   Assistive device Straight cane   Ambulation Surface Level   Berg Balance Test   Sit to Stand Able to stand without using hands and stabilize independently   Standing Unsupported Able to stand safely 2 minutes   Sitting with Back Unsupported but Feet Supported on Floor or Stool Able to sit safely and securely 2 minutes   Stand to Sit Sits safely with minimal use of hands   Transfers Able to transfer safely, minor use of hands   Standing Unsupported with Eyes Closed Able to stand 10 seconds safely   Standing Ubsupported with Feet Together Able to place feet together independently and stand 1 minute safely   From Standing, Reach Forward with Outstretched Arm Can reach confidently >25 cm (10")   From Standing Position, Pick up Object from Floor Able to pick up shoe safely and easily   From Standing Position, Turn to Look Behind  Over each Shoulder Looks behind from both sides and weight shifts well   Turn 360 Degrees Able to turn 360 degrees safely in 4 seconds or less   Standing Unsupported, Alternately Place Feet on Step/Stool Able to complete >2 steps/needs minimal assist   Standing Unsupported, One Foot in Front Able to plae foot ahead of the other independently and hold 30 seconds   Standing on One Leg Tries to lift leg/unable to hold 3 seconds but remains standing independently   Total Score 49                PT Education - 29-Sep-2014 1013    Education provided Yes   Education Details Fall Prevention, continued HEP, stationary bike at home and walking   Person(s) Educated Patient   Methods Explanation;Demonstration;Verbal cues;Handout   Comprehension  Verbalized understanding          PT Short Term Goals - 09/29/2014 1226    PT SHORT TERM GOAL #1   Title Pt will be independent in HEP. Target date: 09/20/14.   Status Achieved   PT SHORT TERM GOAL #2   Title Pt will improve BERG balance score to >/=47/56. Target date: 09/20/14.   Status Achieved   PT SHORT TERM GOAL #3   Title Pt will report no falls in last 2 weeks to improve safety. Target date: 09/20/14.   Status Achieved   PT SHORT TERM GOAL #4   Title Pt will ambulate 300' over even/uneven terrain, with LRAD, with supervision to improve functional mobility so he can ambulate in his yard. Target date: 09/20/14.   Status Achieved           PT Long Term Goals - 29-Sep-2014 1226    PT LONG TERM GOAL #1   Title Pt will verbalize understanding and agreement of fall prevention strategies to decrease falls risk. Target date: 10/18/14.   Status Achieved   PT LONG TERM GOAL #2   Title Pt will improve BERG balance score to >/=51/56 to decrease falls risk. Target date: 10/18/14.   Status Not Met   PT LONG TERM GOAL #3   Title Pt will improve ABC score to >10% to improve quality of life. Target date: 10/18/14.   Status Achieved   PT LONG TERM GOAL #4   Title Pt will ambulate 500' over even/uneven terrain, with LRAD, at MOD I level to improve functional mobility. Target date: 10/18/14.   Status Achieved   PT LONG TERM GOAL #5   Title Pt will report he was able to ambulate in his yard without falling to improve quality of life. Target date: 10/18/14.   Status Achieved  met per patient report               Plan - 09-29-14 1220    Clinical Impression Statement Pt feels like his balance has improved and wishes to d/c from PT at this time.  Pt met STG's 1-4 and LTG 1 , 3, 4 and 5.  LTG # 2 unmet.   PT Next Visit Plan Discharge from PT at this time.   Consulted and Agree with Plan of Care Patient          G-Codes - 09-29-2014 19-Oct-1227    Functional Assessment Tool Used BERG 49/56    Functional Limitation Mobility: Walking and moving around   Mobility: Walking and Moving Around Goal Status [phone]) 0 percent impaired, limited or restricted   Mobility: Walking and Moving Around Discharge Status [phone]) At least 1  percent but less than 20 percent impaired, limited or restricted      Problem List Patient Active Problem List   Diagnosis Date Noted  . Balance problem 08/02/2014  . Accident due to mechanical fall without injury 08/02/2014  . CAD (coronary artery disease) 06/24/2014  . Diabetes mellitus type II, uncontrolled 06/23/2014  . Encounter for therapeutic drug monitoring 10/06/2013  . Chronic systolic heart failure 94/58/5929  . Automatic implantable cardioverter-defibrillator in situ 09/25/2009  . Back pain, chronic 08/10/2009  . CKD (chronic kidney disease), stage II 12/07/2007  . Localized osteoarthrosis, lower leg 04/13/2007  . Hypothyroidism 02/12/2007  . Hyperlipidemia 02/12/2007  . Essential hypertension 02/12/2007  . Atrial fibrillation 02/12/2007    Everett Ricciardelli L 09/14/2014, 1:57 PM  [facility] [address] [place], [place], [address] Phone: [phone]   Fax:  [phone]  PHYSICAL THERAPY DISCHARGE SUMMARY  Visits from Start of Care: 7  Current functional level related to goals / functional outcomes:     PT Long Term Goals - 09/14/14 1226    PT LONG TERM GOAL #1   Title Pt will verbalize understanding and agreement of fall prevention strategies to decrease falls risk. Target date: 10/18/14.   Status Achieved   PT LONG TERM GOAL #2   Title Pt will improve BERG balance score to >/=51/56 to decrease falls risk. Target date: 10/18/14.   Status Not Met   PT LONG TERM GOAL #3   Title Pt will improve ABC score to >10% to improve quality of life. Target date: 10/18/14.   Status Achieved   PT LONG TERM GOAL #4   Title Pt will ambulate 500' over even/uneven terrain, with LRAD, at  MOD I level to improve functional mobility. Target date: 10/18/14.   Status Achieved   PT LONG TERM GOAL #5   Title Pt will report he was able to ambulate in his yard without falling to improve quality of life. Target date: 10/18/14.   Status Achieved  met per patient report       Remaining deficits: Impaired balance   Education / Equipment: HEP and falls prevention handout.  Plan: Patient agrees to discharge.  Patient goals were partially met. Patient is being discharged due to being pleased with the current functional level.  ?????   Pt met all STGs and LTGs, except for LTG BERG goal.     G-code and discharge summary completed by PT.  Geoffry Paradise, PT,DPT 09/14/2014 1:57 PM Phone: [phone] Fax: [phone]

## 2014-09-18 ENCOUNTER — Ambulatory Visit: Payer: Medicare Other

## 2014-10-04 ENCOUNTER — Encounter: Payer: Self-pay | Admitting: Family Medicine

## 2014-10-04 ENCOUNTER — Ambulatory Visit: Payer: Medicare Other | Admitting: Family Medicine

## 2014-10-04 ENCOUNTER — Ambulatory Visit (INDEPENDENT_AMBULATORY_CARE_PROVIDER_SITE_OTHER): Payer: Medicare Other | Admitting: Family Medicine

## 2014-10-04 DIAGNOSIS — R2689 Other abnormalities of gait and mobility: Secondary | ICD-10-CM

## 2014-10-04 DIAGNOSIS — R29818 Other symptoms and signs involving the nervous system: Secondary | ICD-10-CM | POA: Diagnosis not present

## 2014-10-04 DIAGNOSIS — I482 Chronic atrial fibrillation, unspecified: Secondary | ICD-10-CM

## 2014-10-04 DIAGNOSIS — E034 Atrophy of thyroid (acquired): Secondary | ICD-10-CM | POA: Diagnosis not present

## 2014-10-04 DIAGNOSIS — IMO0002 Reserved for concepts with insufficient information to code with codable children: Secondary | ICD-10-CM

## 2014-10-04 DIAGNOSIS — Z23 Encounter for immunization: Secondary | ICD-10-CM | POA: Diagnosis not present

## 2014-10-04 DIAGNOSIS — E1165 Type 2 diabetes mellitus with hyperglycemia: Secondary | ICD-10-CM

## 2014-10-04 DIAGNOSIS — Z5181 Encounter for therapeutic drug level monitoring: Secondary | ICD-10-CM

## 2014-10-04 LAB — HEMOGLOBIN A1C: Hgb A1c MFr Bld: 8.1 % — ABNORMAL HIGH (ref 4.6–6.5)

## 2014-10-04 LAB — POCT INR: INR: 2.3

## 2014-10-04 NOTE — Progress Notes (Signed)
Tana Conch, MD Phone: [phone]  Subjective:   [name] is a 81 y.o. year old very pleasant male patient who presents with the following:  Diabetes Mellitus-reasonable control Lab Results  Component Value Date   HGBA1C 7.8* 06/23/2014  reasonable control for age and comorbidities on no meds.  ROS- no hypoglycemia, no blurry vision.   Balance problems-improving -Balance problems 3-4 falls per year. Weakness with standing. Advised cane. Rolling walker considered. Referred to PT at last visit,   Today patient is doing much better and states has had no falls. Uses cane regularly and is very watchful of surroundings.  ROS-Bumped into a counter and had scrape on left elbow   Past Medical History- Patient Active Problem List   Diagnosis Date Noted  . CAD (coronary artery disease) 06/24/2014    Priority: High  . Diabetes mellitus type II, uncontrolled 06/23/2014    Priority: High  . Chronic systolic heart failure 09/25/2009    Priority: High  . Automatic implantable cardioverter-defibrillator in situ 09/25/2009    Priority: High  . Atrial fibrillation 02/12/2007    Priority: High  . Balance problem 08/02/2014    Priority: Medium  . CKD (chronic kidney disease), stage II 12/07/2007    Priority: Medium  . Hypothyroidism 02/12/2007    Priority: Medium  . Hyperlipidemia 02/12/2007    Priority: Medium  . Essential hypertension 02/12/2007    Priority: Medium  . Encounter for therapeutic drug monitoring 10/06/2013    Priority: Low  . Back pain, chronic 08/10/2009    Priority: Low  . Localized osteoarthrosis, lower leg 04/13/2007    Priority: Low   Medications- reviewed and updated Current Outpatient Prescriptions  Medication Sig Dispense Refill  . amLODipine (NORVASC) 5 MG tablet Take 1 tablet (5 mg total) by mouth daily. 30 tablet 5  . aspirin 81 MG tablet Take 81 mg by mouth daily.      Marland Kitchen atorvastatin (LIPITOR) 20 MG tablet Take 1 tablet (20 mg total) by  mouth daily. 30 tablet 11  . calcium carbonate (OS-CAL) 600 MG TABS Take 1 tablet (600 mg total) by mouth daily. 60 tablet   . furosemide (LASIX) 40 MG tablet TAKE 1 TABLET ONCE DAILY. 30 tablet 11  . irbesartan (AVAPRO) 300 MG tablet Take 1 tablet (300 mg total) by mouth daily. 30 tablet 11  . levothyroxine (SYNTHROID, LEVOTHROID) 100 MCG tablet Take 1 tablet (100 mcg total) by mouth daily before breakfast. 90 tablet 0  . metoprolol (LOPRESSOR) 50 MG tablet TAKE 1&1/2 TABLETS TWICE DAILY. 90 tablet 2  . potassium chloride SA (K-DUR,KLOR-CON) 20 MEQ tablet TAKE 1 TABLET ONCE DAILY. 30 tablet 11  . warfarin (COUMADIN) 2 MG tablet TAKE AS DIRECTED. 40 tablet 1  . nabumetone (RELAFEN) 500 MG tablet Take 1 tablet (500 mg total) by mouth 2 (two) times daily as needed for pain. (Patient not taking: Reported on 10/04/2014) 60 tablet 11   No current facility-administered medications for this visit.    Objective: BP 110/82 mmHg  Temp(Src) 97.5 F (36.4 C)  Wt 172 lb (78.019 kg) Gen: NAD, resting comfortably in chair CV: RRR no murmurs rubs or gallops (sounds to be in sinus) Lungs: CTAB no crackles, wheeze, rhonchi Abdomen: soft/nontender/nondistended/normal bowel sounds. No rebound or guarding.  Skin: warm, dry, abrasion now healing over on left elbow Neuro: grossly normal, moves all extremities, stands without hands without difficulty (improved from last visit)  Assessment/Plan:  Diabetes mellitus type II, uncontrolled Check a1c today, no medications  as long as a1c below 8.    Balance problem No falls since last visit. Doing well in gait and balance training. Always using cane. Advised to continue. No further changes.     Return precautions advised. 4 months planned INR in goal range-no changes.   Orders Placed This Encounter  Procedures  . Hemoglobin A1c    [place]  . POC INR

## 2014-10-04 NOTE — Patient Instructions (Signed)
1 tablet daily except Wednesdays, 1 1/2 tablets.  Re-check in 4 weeks.  Anticoagulation Dose Instructions as of 10/04/2014      [name] Tue Wed Thu Fri Sat   New Dose 2 mg 2 mg 2 mg 3 mg 2 mg 2 mg 2 mg    Description        1 tablet daily except Wednesdays, 1 1/2 tablets.  Re-check in 4 weeks.

## 2014-10-04 NOTE — Assessment & Plan Note (Signed)
No falls since last visit. Doing well in gait and balance training. Always using cane. Advised to continue. No further changes.

## 2014-10-04 NOTE — Patient Instructions (Addendum)
Td today  Then go to lab for a1c, as long as below 8, no medication changes.   Glad no longer having falls. Keep using that cane.

## 2014-10-04 NOTE — Assessment & Plan Note (Signed)
Check a1c today, no medications as long as a1c below 8.

## 2014-10-05 MED ORDER — METFORMIN HCL 500 MG PO TABS: 500.0000 mg | ORAL_TABLET | Freq: Every day | ORAL | Status: DC

## 2014-10-05 NOTE — Addendum Note (Signed)
Addended by: Lieutenant Diego A on: 10/05/2014 08:48 AM   Modules accepted: Orders

## 2014-10-06 ENCOUNTER — Telehealth: Payer: Self-pay | Admitting: Family Medicine

## 2014-10-06 ENCOUNTER — Other Ambulatory Visit: Payer: Self-pay | Admitting: Family Medicine

## 2014-10-06 NOTE — Telephone Encounter (Signed)
Pt had last protime on 10-04-14. Pt wife would like to know when is her hus due for next protime

## 2014-10-09 NOTE — Telephone Encounter (Signed)
Scheduled ON 3/14 @ 3:00pm for INR check.

## 2014-10-09 NOTE — Telephone Encounter (Signed)
Pt has already been notified.

## 2014-10-11 ENCOUNTER — Other Ambulatory Visit: Payer: Self-pay | Admitting: Family

## 2014-10-12 ENCOUNTER — Other Ambulatory Visit: Payer: Self-pay | Admitting: General Practice

## 2014-10-12 MED ORDER — WARFARIN SODIUM 2 MG PO TABS: ORAL_TABLET | ORAL | Status: DC

## 2014-11-06 ENCOUNTER — Ambulatory Visit (INDEPENDENT_AMBULATORY_CARE_PROVIDER_SITE_OTHER): Payer: Medicare Other | Admitting: General Practice

## 2014-11-06 DIAGNOSIS — Z5181 Encounter for therapeutic drug level monitoring: Secondary | ICD-10-CM

## 2014-11-06 LAB — POCT INR: INR: 1.6

## 2014-11-06 NOTE — Progress Notes (Signed)
Pre visit review using our clinic review tool, if applicable. No additional management support is needed unless otherwise documented below in the visit note. 

## 2014-11-07 ENCOUNTER — Encounter: Payer: Self-pay | Admitting: Internal Medicine

## 2014-11-07 ENCOUNTER — Ambulatory Visit (INDEPENDENT_AMBULATORY_CARE_PROVIDER_SITE_OTHER): Payer: Medicare Other | Admitting: Internal Medicine

## 2014-11-07 VITALS — BP 124/78 | HR 72 | Ht 65.0 in | Wt 171.8 lb

## 2014-11-07 DIAGNOSIS — Z95810 Presence of automatic (implantable) cardiac defibrillator: Secondary | ICD-10-CM

## 2014-11-07 DIAGNOSIS — I5022 Chronic systolic (congestive) heart failure: Secondary | ICD-10-CM | POA: Diagnosis not present

## 2014-11-07 DIAGNOSIS — I482 Chronic atrial fibrillation, unspecified: Secondary | ICD-10-CM

## 2014-11-07 LAB — MDC_IDC_ENUM_SESS_TYPE_INCLINIC
HIGH POWER IMPEDANCE MEASURED VALUE: 54 Ohm
HighPow Impedance: 36 Ohm
Implantable Pulse Generator Serial Number: 480118
Lead Channel Impedance Value: 559 Ohm
Lead Channel Impedance Value: 559 Ohm
Lead Channel Pacing Threshold Amplitude: 0.8 V
Lead Channel Pacing Threshold Pulse Width: 0.4 ms
Lead Channel Sensing Intrinsic Amplitude: 2 mV
Lead Channel Setting Pacing Amplitude: 2 V
Lead Channel Setting Pacing Amplitude: 2.5 V
Lead Channel Setting Sensing Sensitivity: 1 mV
MDC IDC MSMT LEADCHNL RV IMPEDANCE VALUE: 514 Ohm
MDC IDC MSMT LEADCHNL RV PACING THRESHOLD AMPLITUDE: 0.5 V
MDC IDC MSMT LEADCHNL RV PACING THRESHOLD PULSEWIDTH: 0.4 ms
MDC IDC SESS DTM: 20160315040000
MDC IDC SET LEADCHNL LV PACING PULSEWIDTH: 0.4 ms
MDC IDC SET LEADCHNL RV PACING PULSEWIDTH: 0.4 ms
MDC IDC SET LEADCHNL RV SENSING SENSITIVITY: 0.5 mV
MDC IDC SET ZONE DETECTION INTERVAL: 250 ms
MDC IDC SET ZONE DETECTION INTERVAL: 353 ms
Zone Setting Detection Interval: 300 ms

## 2014-11-07 NOTE — Assessment & Plan Note (Signed)
His symptoms are class 2. He will continue his current meds. He is encouraged to maintain a low sodium diet. 

## 2014-11-07 NOTE — Progress Notes (Signed)
HPI [name] returns today for followup. He is a very pleasant 81 year old man with a nonischemic cardiomyopathy, chronic systolic heart failure, uncontrolled atrial fibrillation, status post AV node ablation, and insertion of a biventricular ICD. In the interim, he has had no chest pain or sob. No syncope. He has class II heart failure symptoms. Allergies  Allergen Reactions  . Amoxicillin     REACTION: unspecified UNKNOWN  . Ezetimibe-Simvastatin     REACTION: unspecified UNKNOWN  . Penicillins Nausea Only  . Sulfamethoxazole-Trimethoprim     REACTION: nausea     Current Outpatient Prescriptions  Medication Sig Dispense Refill  . amLODipine (NORVASC) 5 MG tablet Take 1 tablet (5 mg total) by mouth daily. 30 tablet 5  . aspirin 81 MG tablet Take 81 mg by mouth daily.      Marland Kitchen atorvastatin (LIPITOR) 20 MG tablet Take 1 tablet (20 mg total) by mouth daily. 30 tablet 11  . calcium carbonate (OS-CAL) 600 MG TABS Take 1 tablet (600 mg total) by mouth daily. 60 tablet   . furosemide (LASIX) 40 MG tablet TAKE 1 TABLET ONCE DAILY. (Patient taking differently: TAKE 1 TABLET BY MOUTH ONCE DAILY.) 30 tablet 11  . irbesartan (AVAPRO) 300 MG tablet Take 1 tablet (300 mg total) by mouth daily. 30 tablet 11  . levothyroxine (SYNTHROID, LEVOTHROID) 100 MCG tablet TAKE 1 TABLET ONCE DAILY BEFORE BREAKFAST. (Patient taking differently: TAKE 1 TABLET BY MOUTH ONCE DAILY BEFORE BREAKFAST.) 90 tablet 1  . metFORMIN (GLUCOPHAGE) 500 MG tablet Take 1 tablet (500 mg total) by mouth daily. 30 tablet 5  . metoprolol (LOPRESSOR) 50 MG tablet TAKE 1&1/2 TABLETS TWICE DAILY. (Patient taking differently: TAKE 1&1/2 TABLETS BY MOUTH TWICE DAILY.) 90 tablet 2  . potassium chloride SA (K-DUR,KLOR-CON) 20 MEQ tablet TAKE 1 TABLET ONCE DAILY. (Patient taking differently: TAKE 1 TABLET BY MOUTH ONCE DAILY.) 30 tablet 11  . warfarin (COUMADIN) 2 MG tablet Take as directed by anticoagulation clinic 35 tablet 1  .  nabumetone (RELAFEN) 500 MG tablet Take 1 tablet (500 mg total) by mouth 2 (two) times daily as needed for pain. (Patient not taking: Reported on 11/07/2014) 60 tablet 11   No current facility-administered medications for this visit.     Past Medical History  Diagnosis Date  . Cardiomyopathy, nonischemic   . Tachycardia induced cardiomyopathy   . LBBB (left bundle branch block)   . CHF (congestive heart failure)   . S/P ablation of atrial fibrillation   . Long term (current) use of anticoagulants   . Hypertension   . Thyroid disease   . S/P AVR (aortic valve replacement) and aortoplasty   . Defibrillator activation   . Hyperlipidemia   . Chronic renal insufficiency   . Aortic valve disease   . DIVERTICULOSIS OF COLON 03/31/2007    Qualifier: Diagnosis of  By: Creta Levin CMA (AAMA), Robin    . Cellulitis and abscess of upper arm and forearm 12/11/2008    Qualifier: Diagnosis of  By: Lovell Sheehan MD, John E     ROS:   All systems reviewed and negative except as noted in the HPI.   Past Surgical History  Procedure Laterality Date  . Coronary artery bypass graft    . Pacmaker,defibr  2006  . Cardiac catheterization  12/04/04  . Aortic valve surgery  1992    St. Jude Valve     Family History  Problem Relation Age of Onset  . Heart disease Mother   . Lung disease  Mother   . Heart attack Father   . Stroke Brother   . Hypertension Brother      History   Social History  . Marital Status: Married    Spouse Name: N/A  . Number of Children: N/A  . Years of Education: N/A   Occupational History  . Not on file.   Social History Main Topics  . Smoking status: Former Smoker    Quit date: 08/25/1968  . Smokeless tobacco: Never Used  . Alcohol Use: 3.5 oz/week    7 drink(s) per week  . Drug Use: No  . Sexual Activity: Yes   Other Topics Concern  . Not on file   Social History Narrative   Married 2001. 2 kids. No grandkids.       Retired from [employer]       Hobbies: tv     BP 124/78 mmHg  Pulse 72  Ht 5\' 5"  (1.651 m)  Wt 171 lb 12.8 oz (77.928 kg)  BMI 28.59 kg/m2  Physical Exam:  Chronically ill appearing 81 year old man,NAD HEENT: Unremarkable Neck:  7 cm JVD, no thyromegally Lungs:  Clear with no wheezes, rales, or rhonchi. HEART:  Regular rate rhythm, no murmurs, no rubs, no clicks Abd:  soft, positive bowel sounds, no organomegally, no rebound, no guarding Ext:  2 plus pulses, no edema, no cyanosis, no clubbing Skin:  No rashes no nodules Neuro:  CN II through XII intact, motor grossly intact  ECG - atrial fib with BiVentricular pacing and frequent PVC's.  DEVICE  Normal device function.  See PaceArt for details.   Assess/Plan:

## 2014-11-07 NOTE — Patient Instructions (Signed)

## 2014-11-07 NOTE — Assessment & Plan Note (Signed)
His ventricular rate is well controlled. He will continue systemic anticoagulation. 

## 2014-11-07 NOTE — Assessment & Plan Note (Signed)
His [employer] ICD is working normally. Will recheck in several months. 

## 2014-11-13 ENCOUNTER — Other Ambulatory Visit: Payer: Self-pay

## 2014-11-13 MED ORDER — IRBESARTAN 300 MG PO TABS: 300.0000 mg | ORAL_TABLET | Freq: Every day | ORAL | Status: DC

## 2014-11-13 NOTE — Telephone Encounter (Signed)
Rx request for Irbesartan 300 mg tablet-Take 1 tablet once daily #30  Pharm:  [facility]

## 2014-11-29 ENCOUNTER — Other Ambulatory Visit: Payer: Self-pay | Admitting: Internal Medicine

## 2014-12-04 ENCOUNTER — Ambulatory Visit (INDEPENDENT_AMBULATORY_CARE_PROVIDER_SITE_OTHER): Payer: Medicare Other | Admitting: General Practice

## 2014-12-04 DIAGNOSIS — Z5181 Encounter for therapeutic drug level monitoring: Secondary | ICD-10-CM | POA: Diagnosis not present

## 2014-12-04 LAB — POCT INR: INR: 1.7

## 2014-12-04 NOTE — Progress Notes (Signed)
Pre visit review using our clinic review tool, if applicable. No additional management support is needed unless otherwise documented below in the visit note. 

## 2014-12-05 ENCOUNTER — Other Ambulatory Visit: Payer: Self-pay | Admitting: Internal Medicine

## 2014-12-05 DIAGNOSIS — H2512 Age-related nuclear cataract, left eye: Secondary | ICD-10-CM | POA: Diagnosis not present

## 2014-12-05 DIAGNOSIS — R7309 Other abnormal glucose: Secondary | ICD-10-CM | POA: Diagnosis not present

## 2014-12-05 DIAGNOSIS — H3531 Nonexudative age-related macular degeneration: Secondary | ICD-10-CM | POA: Diagnosis not present

## 2014-12-05 DIAGNOSIS — H26491 Other secondary cataract, right eye: Secondary | ICD-10-CM | POA: Diagnosis not present

## 2014-12-05 LAB — HM DIABETES EYE EXAM

## 2014-12-07 ENCOUNTER — Encounter: Payer: Self-pay | Admitting: Family Medicine

## 2014-12-07 DIAGNOSIS — H01005 Unspecified blepharitis left lower eyelid: Secondary | ICD-10-CM | POA: Diagnosis not present

## 2014-12-07 DIAGNOSIS — H01001 Unspecified blepharitis right upper eyelid: Secondary | ICD-10-CM | POA: Diagnosis not present

## 2014-12-07 DIAGNOSIS — H01004 Unspecified blepharitis left upper eyelid: Secondary | ICD-10-CM | POA: Diagnosis not present

## 2014-12-07 DIAGNOSIS — H01002 Unspecified blepharitis right lower eyelid: Secondary | ICD-10-CM | POA: Diagnosis not present

## 2014-12-12 ENCOUNTER — Other Ambulatory Visit: Payer: Self-pay | Admitting: Family Medicine

## 2015-01-01 ENCOUNTER — Ambulatory Visit (INDEPENDENT_AMBULATORY_CARE_PROVIDER_SITE_OTHER): Payer: Medicare Other | Admitting: General Practice

## 2015-01-01 DIAGNOSIS — Z5181 Encounter for therapeutic drug level monitoring: Secondary | ICD-10-CM

## 2015-01-01 LAB — POCT INR: INR: 2.3

## 2015-01-01 NOTE — Progress Notes (Signed)
Pre visit review using our clinic review tool, if applicable. No additional management support is needed unless otherwise documented below in the visit note. 

## 2015-01-29 ENCOUNTER — Ambulatory Visit (INDEPENDENT_AMBULATORY_CARE_PROVIDER_SITE_OTHER): Payer: Medicare Other | Admitting: *Deleted

## 2015-01-29 DIAGNOSIS — Z5181 Encounter for therapeutic drug level monitoring: Secondary | ICD-10-CM | POA: Diagnosis not present

## 2015-01-29 DIAGNOSIS — I4891 Unspecified atrial fibrillation: Secondary | ICD-10-CM | POA: Diagnosis not present

## 2015-01-29 LAB — POCT INR: INR: 2

## 2015-01-29 NOTE — Progress Notes (Signed)
Pre visit review using our clinic review tool, if applicable. No additional management support is needed unless otherwise documented below in the visit note. 

## 2015-02-02 ENCOUNTER — Encounter: Payer: Self-pay | Admitting: Family Medicine

## 2015-02-02 ENCOUNTER — Ambulatory Visit (INDEPENDENT_AMBULATORY_CARE_PROVIDER_SITE_OTHER): Payer: Medicare Other | Admitting: Family Medicine

## 2015-02-02 VITALS — BP 122/86 | HR 70 | Temp 98.7°F | Wt 168.0 lb

## 2015-02-02 DIAGNOSIS — E038 Other specified hypothyroidism: Secondary | ICD-10-CM | POA: Diagnosis not present

## 2015-02-02 DIAGNOSIS — E034 Atrophy of thyroid (acquired): Secondary | ICD-10-CM

## 2015-02-02 DIAGNOSIS — IMO0002 Reserved for concepts with insufficient information to code with codable children: Secondary | ICD-10-CM

## 2015-02-02 DIAGNOSIS — R2689 Other abnormalities of gait and mobility: Secondary | ICD-10-CM

## 2015-02-02 DIAGNOSIS — N182 Chronic kidney disease, stage 2 (mild): Secondary | ICD-10-CM | POA: Diagnosis not present

## 2015-02-02 DIAGNOSIS — E1165 Type 2 diabetes mellitus with hyperglycemia: Secondary | ICD-10-CM | POA: Diagnosis not present

## 2015-02-02 DIAGNOSIS — R29818 Other symptoms and signs involving the nervous system: Secondary | ICD-10-CM

## 2015-02-02 DIAGNOSIS — I10 Essential (primary) hypertension: Secondary | ICD-10-CM | POA: Diagnosis not present

## 2015-02-02 LAB — HEMOGLOBIN A1C: Hgb A1c MFr Bld: 6.8 % — ABNORMAL HIGH (ref 4.6–6.5)

## 2015-02-02 LAB — BASIC METABOLIC PANEL
BUN: 17 mg/dL (ref 6–23)
CHLORIDE: 100 meq/L (ref 96–112)
CO2: 28 mEq/L (ref 19–32)
Calcium: 9.5 mg/dL (ref 8.4–10.5)
Creatinine, Ser: 1.06 mg/dL (ref 0.40–1.50)
GFR: 71.3 mL/min (ref >60.00)
Glucose, Bld: 155 mg/dL — ABNORMAL HIGH (ref 70–99)
POTASSIUM: 3.9 meq/L (ref 3.5–5.1)
SODIUM: 135 meq/L (ref 135–145)

## 2015-02-02 LAB — TSH: TSH: 0.32 u[IU]/mL — AB (ref 0.35–4.50)

## 2015-02-02 NOTE — Assessment & Plan Note (Signed)
S: controlled on Irbesartan, metoprolol, lasix, amlodipine.  A/P: continue current rx

## 2015-02-02 NOTE — Assessment & Plan Note (Signed)
S: tylenol effective for pain instead of nsaids. BP controlled. Lipids controlled A/P: advised patient to discontinue relafen given tylenol helps. Continue excellent BP and lipid control.

## 2015-02-02 NOTE — Assessment & Plan Note (Signed)
Cane regularly. No falls. Finished with PT

## 2015-02-02 NOTE — Patient Instructions (Signed)
Labs before you leave. Hopeful a1c below 8  No other changes today  Like you taking the tylenol instead of the relafen, protects your kidneys

## 2015-02-02 NOTE — Assessment & Plan Note (Signed)
S: improved control from 8.1 to 6.8 with metformin 500mg  A/P: continue current rx as no hypoglycemia

## 2015-02-02 NOTE — Assessment & Plan Note (Signed)
S: mild depression of TSH today but has been normal range prior. Asymptomatic.  A/P: continue current rx, repeat in 6 months and only decrease dose if persistently low.

## 2015-02-02 NOTE — Progress Notes (Signed)
Tana Conch, MD  Subjective:  [name] is a 81 y.o. year old very pleasant male patient who presents with:  See problem oriented charting- Cane regularly. No falls ROS- low appetite for at least a year. No hypoglycemia, no blurry vision.   Past Medical History- systolic CHF chronic, CD, cardioverter-debrillator, a fib on warfarin and metoprolol  Medications- reviewed and updated Current Outpatient Prescriptions  Medication Sig Dispense Refill  . amLODipine (NORVASC) 5 MG tablet TAKE 1 TABLET ONCE DAILY. 30 tablet 5  . aspirin 81 MG tablet Take 81 mg by mouth daily.      Marland Kitchen atorvastatin (LIPITOR) 20 MG tablet TAKE 1 TABLET ONCE DAILY. 30 tablet 5  . calcium carbonate (OS-CAL) 600 MG TABS Take 1 tablet (600 mg total) by mouth daily. 60 tablet   . furosemide (LASIX) 40 MG tablet TAKE 1 TABLET ONCE DAILY. 30 tablet 6  . irbesartan (AVAPRO) 300 MG tablet Take 1 tablet (300 mg total) by mouth daily. 30 tablet 5  . levothyroxine (SYNTHROID, LEVOTHROID) 100 MCG tablet TAKE 1 TABLET ONCE DAILY BEFORE BREAKFAST. (Patient taking differently: TAKE 1 TABLET BY MOUTH ONCE DAILY BEFORE BREAKFAST.) 90 tablet 1  . metFORMIN (GLUCOPHAGE) 500 MG tablet Take 1 tablet (500 mg total) by mouth daily. 30 tablet 5  . metoprolol (LOPRESSOR) 50 MG tablet TAKE 1&1/2 TABLETS TWICE DAILY. 90 tablet 6  . potassium chloride SA (K-DUR,KLOR-CON) 20 MEQ tablet TAKE 1 TABLET ONCE DAILY. 30 tablet 10  . warfarin (COUMADIN) 2 MG tablet TAKE AS DIRECTED. 35 tablet 5  . nabumetone (RELAFEN) 500 MG tablet Take 1 tablet (500 mg total) by mouth 2 (two) times daily as needed for pain. (Patient not taking: Reported on 02/02/2015) 60 tablet 11   No current facility-administered medications for this visit.   Objective: BP 122/86 mmHg  Pulse 70  Temp(Src) 98.7 F (37.1 C)  Wt 168 lb (76.204 kg) Gen: NAD, resting comfortably, walks with cane Mucous membranes are moist. CV: RRR no murmurs rubs or gallops (not in a fib  today)  Lungs: CTAB no crackles, wheeze, rhonchi Abdomen: soft/nontender/nondistended/normal bowel sounds. No rebound or guarding.  Ext: trace edema bilaterally Skin: warm, dry Neuro: grossly normal, moves all extremities, walks with cane   Assessment/Plan:  Diabetes mellitus type II, uncontrolled S: improved control from 8.1 to 6.8 with metformin 500mg  A/P: continue current rx as no hypoglycemia   Hypothyroidism S: mild depression of TSH today but has been normal range prior. Asymptomatic.  A/P: continue current rx, repeat in 6 months and only decrease dose if persistently low.    Essential hypertension S: controlled on Irbesartan, metoprolol, lasix, amlodipine.  A/P: continue current rx  Balance problem Cane regularly. No falls. Finished with PT  CKD (chronic kidney disease), stage II S: tylenol effective for pain instead of nsaids. BP controlled. Lipids controlled A/P: advised patient to discontinue relafen given tylenol helps. Continue excellent BP and lipid control.    6 months  Results for orders placed or performed in visit on 02/02/15 (from the past 24 hour(s))  Hemoglobin A1c     Status: Abnormal   Collection Time: 02/02/15  2:44 PM  Result Value Ref Range   Hgb A1c MFr Bld 6.8 (H) 4.6 - 6.5 %  Basic metabolic panel     Status: Abnormal   Collection Time: 02/02/15  2:44 PM  Result Value Ref Range   Sodium 135 135 - 145 mEq/L   Potassium 3.9 3.5 - 5.1 mEq/L  Chloride 100 96 - 112 mEq/L   CO2 28 19 - 32 mEq/L   Glucose, Bld 155 (H) 70 - 99 mg/dL   BUN 17 6 - 23 mg/dL   Creatinine, Ser 1.61 0.40 - 1.50 mg/dL   Calcium 9.5 8.4 - 09.6 mg/dL   GFR 04.54 >09.81 mL/min  TSH     Status: Abnormal   Collection Time: 02/02/15  2:44 PM  Result Value Ref Range   TSH 0.32 (L) 0.35 - 4.50 uIU/mL

## 2015-02-22 ENCOUNTER — Encounter: Payer: Self-pay | Admitting: Internal Medicine

## 2015-02-22 ENCOUNTER — Ambulatory Visit (INDEPENDENT_AMBULATORY_CARE_PROVIDER_SITE_OTHER): Payer: Medicare Other | Admitting: *Deleted

## 2015-02-22 DIAGNOSIS — I482 Chronic atrial fibrillation, unspecified: Secondary | ICD-10-CM

## 2015-02-28 NOTE — Progress Notes (Signed)
Remote ICD transmission.   

## 2015-03-01 ENCOUNTER — Ambulatory Visit (INDEPENDENT_AMBULATORY_CARE_PROVIDER_SITE_OTHER): Payer: Medicare Other | Admitting: General Practice

## 2015-03-01 DIAGNOSIS — I4891 Unspecified atrial fibrillation: Secondary | ICD-10-CM

## 2015-03-01 DIAGNOSIS — Z5181 Encounter for therapeutic drug level monitoring: Secondary | ICD-10-CM

## 2015-03-01 LAB — POCT INR: INR: 2.1

## 2015-03-01 NOTE — Progress Notes (Signed)
Pre visit review using our clinic review tool, if applicable. No additional management support is needed unless otherwise documented below in the visit note. 

## 2015-03-05 LAB — CUP PACEART REMOTE DEVICE CHECK
Battery Remaining Percentage: 76 %
Brady Statistic RA Percent Paced: 0 %
Date Time Interrogation Session: 20160630215100
HighPow Impedance: 55 Ohm
Lead Channel Impedance Value: 532 Ohm
Lead Channel Pacing Threshold Amplitude: 0.5 V
Lead Channel Pacing Threshold Amplitude: 0.8 V
Lead Channel Pacing Threshold Pulse Width: 0.4 ms
Lead Channel Setting Pacing Amplitude: 2 V
Lead Channel Setting Pacing Amplitude: 2.5 V
Lead Channel Setting Pacing Pulse Width: 0.4 ms
Lead Channel Setting Pacing Pulse Width: 0.4 ms
Lead Channel Setting Sensing Sensitivity: 0.5 mV
Lead Channel Setting Sensing Sensitivity: 1 mV
MDC IDC MSMT BATTERY REMAINING LONGEVITY: 48 mo
MDC IDC MSMT LEADCHNL LV IMPEDANCE VALUE: 482 Ohm
MDC IDC MSMT LEADCHNL LV PACING THRESHOLD PULSEWIDTH: 0.4 ms
MDC IDC MSMT LEADCHNL RV IMPEDANCE VALUE: 491 Ohm
MDC IDC PG SERIAL: 480118
MDC IDC SET ZONE DETECTION INTERVAL: 250 ms
MDC IDC SET ZONE DETECTION INTERVAL: 300 ms
MDC IDC STAT BRADY RV PERCENT PACED: 98 %
Zone Setting Detection Interval: 353 ms

## 2015-03-21 ENCOUNTER — Encounter: Payer: Self-pay | Admitting: *Deleted

## 2015-03-29 ENCOUNTER — Other Ambulatory Visit: Payer: Self-pay | Admitting: Family Medicine

## 2015-03-30 ENCOUNTER — Other Ambulatory Visit: Payer: Self-pay | Admitting: *Deleted

## 2015-04-05 ENCOUNTER — Other Ambulatory Visit: Payer: Self-pay | Admitting: Family Medicine

## 2015-04-09 NOTE — Telephone Encounter (Signed)
Rx sent on 8/5.

## 2015-04-12 ENCOUNTER — Ambulatory Visit (INDEPENDENT_AMBULATORY_CARE_PROVIDER_SITE_OTHER): Payer: Medicare Other | Admitting: General Practice

## 2015-04-12 DIAGNOSIS — I4891 Unspecified atrial fibrillation: Secondary | ICD-10-CM | POA: Diagnosis not present

## 2015-04-12 DIAGNOSIS — Z5181 Encounter for therapeutic drug level monitoring: Secondary | ICD-10-CM | POA: Diagnosis not present

## 2015-04-12 LAB — POCT INR: INR: 1.9

## 2015-04-12 NOTE — Progress Notes (Signed)
Pre visit review using our clinic review tool, if applicable. No additional management support is needed unless otherwise documented below in the visit note. 

## 2015-04-25 ENCOUNTER — Ambulatory Visit: Payer: Medicare Other | Admitting: Family Medicine

## 2015-05-11 ENCOUNTER — Other Ambulatory Visit: Payer: Self-pay | Admitting: Family Medicine

## 2015-05-24 ENCOUNTER — Ambulatory Visit: Payer: Medicare Other

## 2015-05-28 ENCOUNTER — Ambulatory Visit (INDEPENDENT_AMBULATORY_CARE_PROVIDER_SITE_OTHER): Payer: Medicare Other | Admitting: *Deleted

## 2015-05-28 ENCOUNTER — Ambulatory Visit (INDEPENDENT_AMBULATORY_CARE_PROVIDER_SITE_OTHER): Payer: Medicare Other | Admitting: General Practice

## 2015-05-28 DIAGNOSIS — Z23 Encounter for immunization: Secondary | ICD-10-CM | POA: Diagnosis not present

## 2015-05-28 DIAGNOSIS — Z95810 Presence of automatic (implantable) cardiac defibrillator: Secondary | ICD-10-CM | POA: Diagnosis not present

## 2015-05-28 DIAGNOSIS — I10 Essential (primary) hypertension: Secondary | ICD-10-CM

## 2015-05-28 DIAGNOSIS — Z5181 Encounter for therapeutic drug level monitoring: Secondary | ICD-10-CM

## 2015-05-28 LAB — CUP PACEART REMOTE DEVICE CHECK
Battery Remaining Longevity: 42 mo
Date Time Interrogation Session: 20161003144100
HighPow Impedance: 58 Ohm
Lead Channel Impedance Value: 547 Ohm
Lead Channel Pacing Threshold Amplitude: 0.5 V
Lead Channel Pacing Threshold Pulse Width: 0.4 ms
Lead Channel Setting Pacing Amplitude: 2.5 V
Lead Channel Setting Sensing Sensitivity: 1 mV
MDC IDC MSMT BATTERY REMAINING PERCENTAGE: 70 %
MDC IDC MSMT LEADCHNL LV IMPEDANCE VALUE: 561 Ohm
MDC IDC MSMT LEADCHNL LV PACING THRESHOLD AMPLITUDE: 0.8 V
MDC IDC MSMT LEADCHNL LV PACING THRESHOLD PULSEWIDTH: 0.4 ms
MDC IDC MSMT LEADCHNL RV IMPEDANCE VALUE: 511 Ohm
MDC IDC PG SERIAL: 480118
MDC IDC SET LEADCHNL LV PACING AMPLITUDE: 2 V
MDC IDC SET LEADCHNL LV PACING PULSEWIDTH: 0.4 ms
MDC IDC SET LEADCHNL RV PACING PULSEWIDTH: 0.4 ms
MDC IDC SET LEADCHNL RV SENSING SENSITIVITY: 0.5 mV
MDC IDC STAT BRADY RA PERCENT PACED: 0 %
MDC IDC STAT BRADY RV PERCENT PACED: 98 %
Zone Setting Detection Interval: 250 ms
Zone Setting Detection Interval: 300 ms
Zone Setting Detection Interval: 353 ms

## 2015-05-28 LAB — POCT INR: INR: 2.3

## 2015-05-28 NOTE — Progress Notes (Signed)
Pre visit review using our clinic review tool, if applicable. No additional management support is needed unless otherwise documented below in the visit note. 

## 2015-05-29 NOTE — Progress Notes (Signed)
Remote ICD transmission.   

## 2015-06-06 ENCOUNTER — Encounter: Payer: Self-pay | Admitting: Cardiology

## 2015-06-13 ENCOUNTER — Other Ambulatory Visit: Payer: Self-pay | Admitting: Family Medicine

## 2015-06-18 ENCOUNTER — Encounter: Payer: Self-pay | Admitting: Internal Medicine

## 2015-07-04 ENCOUNTER — Other Ambulatory Visit: Payer: Self-pay | Admitting: Internal Medicine

## 2015-07-09 ENCOUNTER — Ambulatory Visit (INDEPENDENT_AMBULATORY_CARE_PROVIDER_SITE_OTHER): Payer: Medicare Other | Admitting: General Practice

## 2015-07-09 DIAGNOSIS — Z5181 Encounter for therapeutic drug level monitoring: Secondary | ICD-10-CM | POA: Diagnosis not present

## 2015-07-09 LAB — POCT INR: INR: 2.1

## 2015-07-09 NOTE — Progress Notes (Signed)
Pre visit review using our clinic review tool, if applicable. No additional management support is needed unless otherwise documented below in the visit note. 

## 2015-07-12 ENCOUNTER — Other Ambulatory Visit: Payer: Self-pay | Admitting: Family Medicine

## 2015-07-12 ENCOUNTER — Other Ambulatory Visit: Payer: Self-pay | Admitting: Internal Medicine

## 2015-08-06 ENCOUNTER — Ambulatory Visit (INDEPENDENT_AMBULATORY_CARE_PROVIDER_SITE_OTHER): Payer: Medicare Other | Admitting: Family Medicine

## 2015-08-06 ENCOUNTER — Encounter: Payer: Self-pay | Admitting: Family Medicine

## 2015-08-06 ENCOUNTER — Ambulatory Visit (INDEPENDENT_AMBULATORY_CARE_PROVIDER_SITE_OTHER): Payer: Medicare Other | Admitting: General Practice

## 2015-08-06 VITALS — BP 130/82 | HR 68 | Temp 97.7°F | Wt 165.0 lb

## 2015-08-06 DIAGNOSIS — Z23 Encounter for immunization: Secondary | ICD-10-CM | POA: Diagnosis not present

## 2015-08-06 DIAGNOSIS — IMO0001 Reserved for inherently not codable concepts without codable children: Secondary | ICD-10-CM

## 2015-08-06 DIAGNOSIS — I5022 Chronic systolic (congestive) heart failure: Secondary | ICD-10-CM

## 2015-08-06 DIAGNOSIS — I480 Paroxysmal atrial fibrillation: Secondary | ICD-10-CM

## 2015-08-06 DIAGNOSIS — E1165 Type 2 diabetes mellitus with hyperglycemia: Secondary | ICD-10-CM

## 2015-08-06 DIAGNOSIS — Z5181 Encounter for therapeutic drug level monitoring: Secondary | ICD-10-CM

## 2015-08-06 DIAGNOSIS — E034 Atrophy of thyroid (acquired): Secondary | ICD-10-CM

## 2015-08-06 DIAGNOSIS — E038 Other specified hypothyroidism: Secondary | ICD-10-CM

## 2015-08-06 LAB — COMPREHENSIVE METABOLIC PANEL
ALBUMIN: 4.1 g/dL (ref 3.5–5.2)
ALK PHOS: 186 U/L — AB (ref 39–117)
ALT: 11 U/L (ref 0–53)
AST: 14 U/L (ref 0–37)
BUN: 19 mg/dL (ref 6–23)
CALCIUM: 9.7 mg/dL (ref 8.4–10.5)
CHLORIDE: 101 meq/L (ref 96–112)
CO2: 31 mEq/L (ref 19–32)
CREATININE: 1.13 mg/dL (ref 0.40–1.50)
GFR: 66.14 mL/min (ref >60.00)
Glucose, Bld: 129 mg/dL — ABNORMAL HIGH (ref 70–99)
POTASSIUM: 4.1 meq/L (ref 3.5–5.1)
SODIUM: 139 meq/L (ref 135–145)
TOTAL PROTEIN: 7.4 g/dL (ref 6.0–8.3)
Total Bilirubin: 0.7 mg/dL (ref 0.2–1.2)

## 2015-08-06 LAB — CBC
HEMATOCRIT: 46.2 % (ref 39.0–52.0)
Hemoglobin: 15 g/dL (ref 13.0–17.0)
MCHC: 32.5 g/dL (ref 30.0–36.0)
MCV: 92.5 fl (ref 78.0–100.0)
Platelets: 191 10*3/uL (ref 150.0–400.0)
RBC: 4.99 Mil/uL (ref 4.22–5.81)
RDW: 13.7 % (ref 11.5–15.5)
WBC: 7.1 10*3/uL (ref 4.0–10.5)

## 2015-08-06 LAB — POCT INR: INR: 2.3

## 2015-08-06 LAB — LDL CHOLESTEROL, DIRECT: Direct LDL: 75 mg/dL

## 2015-08-06 LAB — TSH: TSH: 0.97 u[IU]/mL (ref 0.35–4.50)

## 2015-08-06 LAB — HEMOGLOBIN A1C: Hgb A1c MFr Bld: 6.8 % — ABNORMAL HIGH (ref 4.6–6.5)

## 2015-08-06 MED ORDER — WARFARIN SODIUM 2 MG PO TABS: ORAL_TABLET | ORAL | Status: DC

## 2015-08-06 NOTE — Assessment & Plan Note (Signed)
S: well controlled. On metformin 500mg  daily CBGs- does not check Exercise and diet- eating too many sweets  Lab Results  Component Value Date   HGBA1C 6.8* 02/02/2015   HGBA1C 8.1* 10/04/2014   HGBA1C 7.8* 06/23/2014   A/P: continue current prescription. Check a1c today

## 2015-08-06 NOTE — Assessment & Plan Note (Signed)
S: mild suppression of TSH last visit but had been stbale before then. On levothyroxine .  A/P: will recheck TSH

## 2015-08-06 NOTE — Assessment & Plan Note (Signed)
S: stable/controlled on Lasix 40mg , irbesartan, metoprolol with no recent weight gain or edema or increased shortness of breath. No shortness of breath with his basic activities but he does not walk much due to balance issues honestly. Likely remains Stage II A/P: continue current prescription. Sees cardiology in march.

## 2015-08-06 NOTE — Progress Notes (Signed)
Tana Conch, MD  Subjective:  [name] is a 81 y.o. year old very pleasant male patient who presents for/with See problem oriented charting ROS- denies chest pain or shortness of breath. Denies palpitations. Balance has improved as compared to 2 visits ago.   Past Medical History-  Patient Active Problem List   Diagnosis Date Noted  . CAD (coronary artery disease) 06/24/2014    Priority: High  . Diabetes mellitus type II, uncontrolled (HCC) 06/23/2014    Priority: High  . Chronic systolic heart failure (HCC) 09/25/2009    Priority: High  . Automatic implantable cardioverter-defibrillator in situ 09/25/2009    Priority: High  . Atrial fibrillation (HCC) 02/12/2007    Priority: High  . Balance problem 08/02/2014    Priority: Medium  . CKD (chronic kidney disease), stage II 12/07/2007    Priority: Medium  . Hypothyroidism 02/12/2007    Priority: Medium  . Hyperlipidemia 02/12/2007    Priority: Medium  . Essential hypertension 02/12/2007    Priority: Medium  . Encounter for therapeutic drug monitoring 10/06/2013    Priority: Low  . Back pain, chronic 08/10/2009    Priority: Low  . Localized osteoarthrosis, lower leg 04/13/2007    Priority: Low    Medications- reviewed and updated Current Outpatient Prescriptions  Medication Sig Dispense Refill  . amLODipine (NORVASC) 5 MG tablet TAKE 1 TABLET ONCE DAILY. 30 tablet 5  . aspirin 81 MG tablet Take 81 mg by mouth daily.      Marland Kitchen atorvastatin (LIPITOR) 20 MG tablet TAKE 1 TABLET ONCE DAILY. 30 tablet 5  . calcium carbonate (OS-CAL) 600 MG TABS Take 1 tablet (600 mg total) by mouth daily. 60 tablet   . furosemide (LASIX) 40 MG tablet Take 1 tablet (40 mg total) by mouth daily. 30 tablet 3  . irbesartan (AVAPRO) 300 MG tablet TAKE 1 TABLET ONCE DAILY. 30 tablet 5  . levothyroxine (SYNTHROID, LEVOTHROID) 100 MCG tablet TAKE 1 TABLET ONCE DAILY BEFORE BREAKFAST. 90 tablet 3  . metFORMIN (GLUCOPHAGE) 500 MG tablet TAKE 1  TABLET EACH DAY. 30 tablet 5  . metoprolol (LOPRESSOR) 50 MG tablet Take 1.5 tablets (75 mg total) by mouth 2 (two) times daily. 90 tablet 3  . potassium chloride SA (K-DUR,KLOR-CON) 20 MEQ tablet TAKE 1 TABLET ONCE DAILY. 30 tablet 10  . warfarin (COUMADIN) 2 MG tablet TAKE AS DIRECTED. 35 tablet 6   No current facility-administered medications for this visit.    Objective: BP 130/82 mmHg  Pulse 68  Temp(Src) 97.7 F (36.5 C)  Wt 165 lb (74.844 kg) Gen: NAD, resting comfortably CV: RRR- appears in sinus. no murmurs rubs or gallops Lungs: CTAB no crackles, wheeze, rhonchi Abdomen: soft/nontender/nondistended/normal bowel sounds. No rebound or guarding.  Ext: no edema Skin: warm, dry  Diabetic Foot Exam - Simple   Simple Foot Form  Diabetic Foot exam was performed with the following findings:  Yes 08/06/2015  2:29 PM  Visual Inspection  No deformities, no ulcerations, no other skin breakdown bilaterally:  Yes  Sensation Testing  Intact to touch and monofilament testing bilaterally:  Yes  Pulse Check  Posterior Tibialis and Dorsalis pulse intact bilaterally:  Yes  Comments  Dry- advised lotion     Assessment/Plan:  Diabetes mellitus type II, uncontrolled S: well controlled. On metformin 500mg  daily CBGs- does not check Exercise and diet- eating too many sweets  Lab Results  Component Value Date   HGBA1C 6.8* 02/02/2015   HGBA1C 8.1* 10/04/2014  HGBA1C 7.8* 06/23/2014   A/P: continue current prescription. Check a1c today  Chronic systolic heart failure S: stable/controlled on Lasix , irbesartan, metoprolol with no recent weight gain or edema or increased shortness of breath. No shortness of breath with his basic activities but he does not walk much due to balance issues honestly. Likely remains Stage II A/P: continue current prescription. Sees cardiology in march.    Atrial fibrillation S: paroxysmal, appears to be in sinus today. Compliant with warfarin which  was refilled and with metoprolol 75 mg (1.5 tabs) BID A/P: continue current rx- [facility].    Hypothyroidism S: mild suppression of TSH last visit but had been [place] before then. On levothyroxine .  A/P: will recheck TSH  6 month or sooner prn Return precautions advised.   Orders Placed This Encounter  Procedures  . Pneumococcal polysaccharide vaccine 23-valent greater than or equal to 2yo subcutaneous/IM  . Hemoglobin A1c  . TSH    [place]  . LDL cholesterol, direct    [place]  . Comprehensive metabolic panel    [place]  . CBC    [place]    Meds ordered this encounter  Medications  . warfarin (COUMADIN) 2 MG tablet    Sig: TAKE AS DIRECTED.    Dispense:  35 tablet    Refill:  6   Health Maintenance Due  Topic Date Due  . FOOT EXAM - today 06/24/2015  . HEMOGLOBIN A1C - today 08/04/2015

## 2015-08-06 NOTE — Assessment & Plan Note (Signed)
S: paroxysmal, appears to be in sinus today. Compliant with warfarin which was refilled and with metoprolol 75 mg (1.5 tabs) BID A/P: continue current rx- [facility].

## 2015-08-06 NOTE — Progress Notes (Signed)
Pre visit review using our clinic review tool, if applicable. No additional management support is needed unless otherwise documented below in the visit note. 

## 2015-08-06 NOTE — Patient Instructions (Signed)
Update labs before you leave No changes at this time  See you in 6 months Try to make sure to eat something 3x a day

## 2015-08-28 ENCOUNTER — Ambulatory Visit (INDEPENDENT_AMBULATORY_CARE_PROVIDER_SITE_OTHER): Payer: Medicare Other | Admitting: *Deleted

## 2015-08-28 DIAGNOSIS — Z95810 Presence of automatic (implantable) cardiac defibrillator: Secondary | ICD-10-CM

## 2015-08-28 DIAGNOSIS — I482 Chronic atrial fibrillation, unspecified: Secondary | ICD-10-CM

## 2015-08-29 NOTE — Progress Notes (Signed)
Remote ICD transmission.   

## 2015-09-07 LAB — CUP PACEART REMOTE DEVICE CHECK
Battery Remaining Longevity: 42 mo
Battery Remaining Percentage: 67 %
Brady Statistic RA Percent Paced: 0 %
Brady Statistic RV Percent Paced: 98 %
Date Time Interrogation Session: 20170103170900
HighPow Impedance: 55 Ohm
Implantable Lead Implant Date: 20060718
Implantable Lead Implant Date: 20060718
Implantable Lead Implant Date: 20110902
Implantable Lead Location: 753858
Implantable Lead Location: 753859
Implantable Lead Location: 753860
Implantable Lead Model: 158
Implantable Lead Model: 4087
Implantable Lead Model: 4196
Implantable Lead Serial Number: 164746
Implantable Lead Serial Number: 250893
Lead Channel Impedance Value: 501 Ohm
Lead Channel Impedance Value: 545 Ohm
Lead Channel Impedance Value: 549 Ohm
Lead Channel Pacing Threshold Amplitude: 0.5 V
Lead Channel Pacing Threshold Amplitude: 0.8 V
Lead Channel Pacing Threshold Pulse Width: 0.4 ms
Lead Channel Pacing Threshold Pulse Width: 0.4 ms
Lead Channel Setting Pacing Amplitude: 2 V
Lead Channel Setting Pacing Amplitude: 2.5 V
Lead Channel Setting Pacing Pulse Width: 0.4 ms
Lead Channel Setting Pacing Pulse Width: 0.4 ms
Lead Channel Setting Sensing Sensitivity: 0.5 mV
Lead Channel Setting Sensing Sensitivity: 1 mV
Pulse Gen Serial Number: 480118

## 2015-09-11 ENCOUNTER — Other Ambulatory Visit: Payer: Self-pay | Admitting: *Deleted

## 2015-09-11 MED ORDER — FUROSEMIDE 40 MG PO TABS: 40.0000 mg | ORAL_TABLET | Freq: Every day | ORAL | Status: DC

## 2015-09-11 MED ORDER — POTASSIUM CHLORIDE CRYS ER 20 MEQ PO TBCR: 20.0000 meq | EXTENDED_RELEASE_TABLET | Freq: Every day | ORAL | Status: DC

## 2015-09-11 NOTE — Telephone Encounter (Signed)
metoprolol (LOPRESSOR) 50 MG tablet 90 tablet 3 07/05/2015      Sig - Route: Take 1.5 tablets (75 mg total) by mouth 2 (two) times daily. - Oral    E-Prescribing Status: Sent to pharmacy (07/05/2015 9:56 AM EST)     Pharmacy    [facility] - [place], [place] - [address]

## 2015-09-12 ENCOUNTER — Encounter: Payer: Self-pay | Admitting: Cardiology

## 2015-09-13 ENCOUNTER — Other Ambulatory Visit: Payer: Self-pay | Admitting: Family Medicine

## 2015-09-13 MED ORDER — ATORVASTATIN CALCIUM 20 MG PO TABS: 20.0000 mg | ORAL_TABLET | Freq: Every day | ORAL | Status: DC

## 2015-09-13 MED ORDER — LEVOTHYROXINE SODIUM 100 MCG PO TABS: ORAL_TABLET | ORAL | Status: DC

## 2015-09-13 MED ORDER — WARFARIN SODIUM 2 MG PO TABS: ORAL_TABLET | ORAL | Status: DC

## 2015-09-13 MED ORDER — IRBESARTAN 300 MG PO TABS: 300.0000 mg | ORAL_TABLET | Freq: Every day | ORAL | Status: DC

## 2015-09-13 MED ORDER — AMLODIPINE BESYLATE 5 MG PO TABS: 5.0000 mg | ORAL_TABLET | Freq: Every day | ORAL | Status: DC

## 2015-09-13 MED ORDER — METFORMIN HCL 500 MG PO TABS: ORAL_TABLET | ORAL | Status: DC

## 2015-09-13 NOTE — Telephone Encounter (Signed)
Pt seen by Dr. Durene Cal on 08/06/15.  Advised to come back in 6 months.  Pt has appt for 02/04/16.   Sent to [facility] for 6 months (enough to get through to next appt).

## 2015-09-17 ENCOUNTER — Ambulatory Visit: Payer: Medicare Other

## 2015-09-18 ENCOUNTER — Other Ambulatory Visit: Payer: Self-pay | Admitting: Internal Medicine

## 2015-09-18 NOTE — Telephone Encounter (Signed)
Did not see where patient requested to change from local to mail order pharmacy.

## 2015-09-19 ENCOUNTER — Ambulatory Visit (INDEPENDENT_AMBULATORY_CARE_PROVIDER_SITE_OTHER): Payer: Medicare Other | Admitting: General Practice

## 2015-09-19 ENCOUNTER — Other Ambulatory Visit: Payer: Self-pay | Admitting: General Practice

## 2015-09-19 ENCOUNTER — Telehealth: Payer: Self-pay | Admitting: Family Medicine

## 2015-09-19 DIAGNOSIS — I4891 Unspecified atrial fibrillation: Secondary | ICD-10-CM | POA: Diagnosis not present

## 2015-09-19 DIAGNOSIS — Z5181 Encounter for therapeutic drug level monitoring: Secondary | ICD-10-CM | POA: Diagnosis not present

## 2015-09-19 LAB — POCT INR: INR: 2

## 2015-09-19 MED ORDER — WARFARIN SODIUM 2 MG PO TABS
ORAL_TABLET | ORAL | Status: DC
Start: 2015-09-19 — End: 2015-10-25

## 2015-09-19 NOTE — Telephone Encounter (Signed)
[name] w/[facility] would like clarification on the Warfarin 2mg  prescription.   Please call [phone] and give refererene # [phone]

## 2015-09-21 NOTE — Telephone Encounter (Signed)
Arline Asp can you please help with this?  I returned the pharmacist call and she had questions regarding pt coumadin that I could not answer regarding directions for the coumadin as well as the qty and number of refills. I informed the pharmacy someone would return their call on Monday. Thanks for your help.

## 2015-09-24 NOTE — Telephone Encounter (Signed)
Called [facility] to verify Warfarin Prescription.

## 2015-10-25 ENCOUNTER — Other Ambulatory Visit: Payer: Self-pay | Admitting: Internal Medicine

## 2015-10-25 ENCOUNTER — Encounter: Payer: Self-pay | Admitting: Internal Medicine

## 2015-10-25 ENCOUNTER — Ambulatory Visit (INDEPENDENT_AMBULATORY_CARE_PROVIDER_SITE_OTHER): Payer: Medicare Other | Admitting: Internal Medicine

## 2015-10-25 VITALS — BP 140/82 | HR 70 | Ht 65.0 in | Wt 167.8 lb

## 2015-10-25 DIAGNOSIS — I482 Chronic atrial fibrillation, unspecified: Secondary | ICD-10-CM

## 2015-10-25 LAB — CUP PACEART INCLINIC DEVICE CHECK
HIGH POWER IMPEDANCE MEASURED VALUE: 36 Ohm
HIGH POWER IMPEDANCE MEASURED VALUE: 54 Ohm
Implantable Lead Implant Date: 20060718
Implantable Lead Location: 753858
Implantable Lead Location: 753859
Implantable Lead Model: 158
Implantable Lead Model: 4196
Implantable Lead Serial Number: 164746
Lead Channel Impedance Value: 496 Ohm
Lead Channel Impedance Value: 544 Ohm
Lead Channel Pacing Threshold Pulse Width: 0.4 ms
Lead Channel Sensing Intrinsic Amplitude: 1.1 mV
Lead Channel Setting Pacing Amplitude: 2 V
Lead Channel Setting Sensing Sensitivity: 0.5 mV
Lead Channel Setting Sensing Sensitivity: 1 mV
MDC IDC LEAD IMPLANT DT: 20060718
MDC IDC LEAD IMPLANT DT: 20110902
MDC IDC LEAD LOCATION: 753860
MDC IDC LEAD MODEL: 4087
MDC IDC LEAD SERIAL: 250893
MDC IDC MSMT LEADCHNL LV IMPEDANCE VALUE: 554 Ohm
MDC IDC MSMT LEADCHNL LV PACING THRESHOLD AMPLITUDE: 0.9 V
MDC IDC MSMT LEADCHNL RV PACING THRESHOLD AMPLITUDE: 0.6 V
MDC IDC MSMT LEADCHNL RV PACING THRESHOLD PULSEWIDTH: 0.4 ms
MDC IDC PG SERIAL: 480118
MDC IDC SESS DTM: 20170302050000
MDC IDC SET LEADCHNL LV PACING PULSEWIDTH: 0.4 ms
MDC IDC SET LEADCHNL RV PACING AMPLITUDE: 2.5 V
MDC IDC SET LEADCHNL RV PACING PULSEWIDTH: 0.4 ms

## 2015-10-25 NOTE — Progress Notes (Signed)
HPI [name] returns today for followup. He is a very pleasant 82 year old man with a nonischemic cardiomyopathy, chronic systolic heart failure, uncontrolled atrial fibrillation, status post AV node ablation, and insertion of a biventricular ICD. In the interim, he has had no chest pain or sob. No syncope. He has class II heart failure symptoms. He has not fallen and has not been in the hospital.  Allergies  Allergen Reactions  . Amoxicillin     REACTION: unspecified UNKNOWN  . Ezetimibe-Simvastatin     REACTION: unspecified UNKNOWN  . Penicillins Nausea Only  . Sulfamethoxazole-Trimethoprim     REACTION: nausea     Current Outpatient Prescriptions  Medication Sig Dispense Refill  . amLODipine (NORVASC) 5 MG tablet Take 1 tablet (5 mg total) by mouth daily. 90 tablet 1  . aspirin 81 MG tablet Take 81 mg by mouth daily.      Marland Kitchen atorvastatin (LIPITOR) 20 MG tablet Take 1 tablet (20 mg total) by mouth daily. 90 tablet 1  . calcium carbonate (OS-CAL) 600 MG TABS Take 1 tablet (600 mg total) by mouth daily. 60 tablet   . furosemide (LASIX) 40 MG tablet Take 1 tablet (40 mg total) by mouth daily. 30 tablet 0  . irbesartan (AVAPRO) 300 MG tablet Take 1 tablet (300 mg total) by mouth daily. 90 tablet 1  . levothyroxine (SYNTHROID, LEVOTHROID) 100 MCG tablet TAKE 1 TABLET ONCE DAILY BEFORE BREAKFAST. 90 tablet 1  . metFORMIN (GLUCOPHAGE) 500 MG tablet TAKE 1 TABLET EACH DAY. 90 tablet 1  . metoprolol (LOPRESSOR) 50 MG tablet Take 1.5 tablets (75 mg total) by mouth 2 (two) times daily. 90 tablet 3  . potassium chloride SA (K-DUR,KLOR-CON) 20 MEQ tablet Take 1 tablet (20 mEq total) by mouth daily. 30 tablet 0  . warfarin (COUMADIN) 2 MG tablet TAKE AS DIRECTED. 35 tablet 6   No current facility-administered medications for this visit.     Past Medical History  Diagnosis Date  . Cardiomyopathy, nonischemic (HCC)   . Tachycardia induced cardiomyopathy (HCC)   . LBBB (left bundle branch  block)   . CHF (congestive heart failure) (HCC)   . S/P ablation of atrial fibrillation   . Long term (current) use of anticoagulants   . Hypertension   . Thyroid disease   . S/P AVR (aortic valve replacement) and aortoplasty   . Defibrillator activation   . Hyperlipidemia   . Chronic renal insufficiency   . Aortic valve disease   . DIVERTICULOSIS OF COLON 03/31/2007    Qualifier: Diagnosis of  By: Creta Levin CMA (AAMA), Robin    . Cellulitis and abscess of upper arm and forearm 12/11/2008    Qualifier: Diagnosis of  By: Lovell Sheehan MD, John E     ROS:   All systems reviewed and negative except as noted in the HPI.   Past Surgical History  Procedure Laterality Date  . Coronary artery bypass graft    . Pacmaker,defibr  2006  . Cardiac catheterization  12/04/04  . Aortic valve surgery  1992    St[employer] Jude Valve     Family History  Problem Relation Age of Onset  . Heart disease Mother   . Lung disease Mother   . Heart attack Father   . Stroke Brother   . Hypertension Brother      Social History   Social History  . Marital Status: Married    Spouse Name: N/A  . Number of Children: N/A  . Years of Education: N/A  Occupational History  . Not on file.   Social History Main Topics  . Smoking status: Former Smoker    Quit date: 08/25/1968  . Smokeless tobacco: Never Used  . Alcohol Use: 3.5 oz/week    7 drink(s) per week  . Drug Use: No  . Sexual Activity: Yes   Other Topics Concern  . Not on file   Social History Narrative   Married 2001. 2 kids. No grandkids.       Retired from [employer]      Hobbies: tv     BP 140/82 mmHg  Pulse 70  Ht 5\' 5"  (1.651 m)  Wt 167 lb 12.8 oz (76.114 kg)  BMI 27.92 kg/m2  Physical Exam:  Chronically ill appearing 82 year old man,NAD HEENT: Unremarkable Neck:  7 cm JVD, no thyromegally Lungs:  Clear with no wheezes, rales, or rhonchi. HEART:  Regular rate rhythm, no murmurs, no rubs, no clicks Abd:  soft,  positive bowel sounds, no organomegally, no rebound, no guarding Ext:  2 plus pulses, no edema, no cyanosis, no clubbing Skin:  No rashes no nodules Neuro:  CN II through XII intact, motor grossly intact  ECG - atrial fib with BiVentricular pacing   DEVICE  Normal device function.  See PaceArt for details.   Assess/Plan: 1. Chronic systolic heart failure - he remains class 2. He has become more sedentary and I have asked him to avoid this. 2. Atrial fib - his ventricular rate is well controlled. No change in meds. 3. ICD - his device is working normally. Will recheck in several months.  Leonia Reeves.D.

## 2015-10-25 NOTE — Patient Instructions (Signed)
Medication Instructions:  Your physician recommends that you continue on your current medications as directed. Please refer to the Current Medication list given to you today.    Labwork: None ordered   Testing/Procedures: None ordered   Follow-Up: Your physician wants you to follow-up in: 12 months with Dr Taylor You will receive a reminder letter in the mail two months in advance. If you don't receive a letter, please call our office to schedule the follow-up appointment.  Remote monitoring is used to monitor your Pacemaker  from home. This monitoring reduces the number of office visits required to check your device to one time per year. It allows us to keep an eye on the functioning of your device to ensure it is working properly. You are scheduled for a device check from home on 01/24/16. You may send your transmission at any time that day. If you have a wireless device, the transmission will be sent automatically. After your physician reviews your transmission, you will receive a postcard with your next transmission date.     Any Other Special Instructions Will Be Listed Below (If Applicable).     If you need a refill on your cardiac medications before your next appointment, please call your pharmacy.   

## 2015-10-31 ENCOUNTER — Ambulatory Visit (INDEPENDENT_AMBULATORY_CARE_PROVIDER_SITE_OTHER): Payer: Medicare Other | Admitting: General Practice

## 2015-10-31 ENCOUNTER — Other Ambulatory Visit: Payer: Self-pay | Admitting: Internal Medicine

## 2015-10-31 DIAGNOSIS — I4891 Unspecified atrial fibrillation: Secondary | ICD-10-CM | POA: Diagnosis not present

## 2015-10-31 DIAGNOSIS — Z5181 Encounter for therapeutic drug level monitoring: Secondary | ICD-10-CM

## 2015-10-31 LAB — POCT INR: INR: 1.6

## 2015-10-31 NOTE — Progress Notes (Signed)
Pre visit review using our clinic review tool, if applicable. No additional management support is needed unless otherwise documented below in the visit note. 

## 2015-11-21 ENCOUNTER — Ambulatory Visit (INDEPENDENT_AMBULATORY_CARE_PROVIDER_SITE_OTHER): Payer: Medicare Other | Admitting: General Practice

## 2015-11-21 DIAGNOSIS — Z5181 Encounter for therapeutic drug level monitoring: Secondary | ICD-10-CM

## 2015-11-21 DIAGNOSIS — I4891 Unspecified atrial fibrillation: Secondary | ICD-10-CM | POA: Diagnosis not present

## 2015-11-21 LAB — POCT INR: INR: 2

## 2015-11-21 NOTE — Progress Notes (Signed)
Pre visit review using our clinic review tool, if applicable. No additional management support is needed unless otherwise documented below in the visit note. 

## 2015-12-06 ENCOUNTER — Other Ambulatory Visit: Payer: Self-pay | Admitting: Family Medicine

## 2015-12-10 DIAGNOSIS — H2512 Age-related nuclear cataract, left eye: Secondary | ICD-10-CM | POA: Diagnosis not present

## 2015-12-10 DIAGNOSIS — H5202 Hypermetropia, left eye: Secondary | ICD-10-CM | POA: Diagnosis not present

## 2015-12-10 DIAGNOSIS — E119 Type 2 diabetes mellitus without complications: Secondary | ICD-10-CM | POA: Diagnosis not present

## 2015-12-10 DIAGNOSIS — H26491 Other secondary cataract, right eye: Secondary | ICD-10-CM | POA: Diagnosis not present

## 2015-12-10 LAB — HM DIABETES EYE EXAM

## 2015-12-11 ENCOUNTER — Encounter: Payer: Self-pay | Admitting: Family Medicine

## 2015-12-12 ENCOUNTER — Other Ambulatory Visit: Payer: Self-pay | Admitting: Internal Medicine

## 2015-12-19 ENCOUNTER — Ambulatory Visit (INDEPENDENT_AMBULATORY_CARE_PROVIDER_SITE_OTHER): Payer: Medicare Other | Admitting: General Practice

## 2015-12-19 DIAGNOSIS — Z5181 Encounter for therapeutic drug level monitoring: Secondary | ICD-10-CM

## 2015-12-19 DIAGNOSIS — I4891 Unspecified atrial fibrillation: Secondary | ICD-10-CM

## 2015-12-19 LAB — POCT INR: INR: 1.8

## 2015-12-19 NOTE — Progress Notes (Signed)
I have reviewed and agree with the plan. 

## 2015-12-19 NOTE — Progress Notes (Signed)
Pre visit review using our clinic review tool, if applicable. No additional management support is needed unless otherwise documented below in the visit note. 

## 2015-12-26 ENCOUNTER — Other Ambulatory Visit: Payer: Self-pay | Admitting: Family Medicine

## 2016-01-16 ENCOUNTER — Ambulatory Visit (INDEPENDENT_AMBULATORY_CARE_PROVIDER_SITE_OTHER): Payer: Medicare Other | Admitting: General Practice

## 2016-01-16 DIAGNOSIS — I4891 Unspecified atrial fibrillation: Secondary | ICD-10-CM

## 2016-01-16 DIAGNOSIS — Z5181 Encounter for therapeutic drug level monitoring: Secondary | ICD-10-CM

## 2016-01-16 LAB — POCT INR: INR: 1.8

## 2016-01-16 NOTE — Progress Notes (Signed)
Pre visit review using our clinic review tool, if applicable. No additional management support is needed unless otherwise documented below in the visit note. 

## 2016-01-19 ENCOUNTER — Other Ambulatory Visit: Payer: Self-pay | Admitting: Family Medicine

## 2016-01-24 ENCOUNTER — Ambulatory Visit (INDEPENDENT_AMBULATORY_CARE_PROVIDER_SITE_OTHER): Payer: Medicare Other | Admitting: *Deleted

## 2016-01-24 ENCOUNTER — Telehealth: Payer: Self-pay | Admitting: Cardiology

## 2016-01-24 DIAGNOSIS — I482 Chronic atrial fibrillation, unspecified: Secondary | ICD-10-CM

## 2016-01-24 DIAGNOSIS — Z95810 Presence of automatic (implantable) cardiac defibrillator: Secondary | ICD-10-CM

## 2016-01-24 NOTE — Telephone Encounter (Signed)
Spoke with pt and reminded pt of remote transmission that is due today. Pt verbalized understanding.   

## 2016-01-25 NOTE — Progress Notes (Signed)
Remote ICD transmission.   

## 2016-02-04 ENCOUNTER — Encounter: Payer: Self-pay | Admitting: Family Medicine

## 2016-02-04 ENCOUNTER — Ambulatory Visit (INDEPENDENT_AMBULATORY_CARE_PROVIDER_SITE_OTHER): Payer: Medicare Other | Admitting: Family Medicine

## 2016-02-04 VITALS — BP 108/76 | HR 70 | Temp 97.9°F | Ht 65.0 in | Wt 160.0 lb

## 2016-02-04 DIAGNOSIS — E1165 Type 2 diabetes mellitus with hyperglycemia: Secondary | ICD-10-CM

## 2016-02-04 DIAGNOSIS — I5022 Chronic systolic (congestive) heart failure: Secondary | ICD-10-CM | POA: Diagnosis not present

## 2016-02-04 DIAGNOSIS — E034 Atrophy of thyroid (acquired): Secondary | ICD-10-CM | POA: Diagnosis not present

## 2016-02-04 DIAGNOSIS — I10 Essential (primary) hypertension: Secondary | ICD-10-CM

## 2016-02-04 DIAGNOSIS — E038 Other specified hypothyroidism: Secondary | ICD-10-CM

## 2016-02-04 DIAGNOSIS — I482 Chronic atrial fibrillation, unspecified: Secondary | ICD-10-CM

## 2016-02-04 DIAGNOSIS — IMO0001 Reserved for inherently not codable concepts without codable children: Secondary | ICD-10-CM

## 2016-02-04 DIAGNOSIS — E785 Hyperlipidemia, unspecified: Secondary | ICD-10-CM

## 2016-02-04 LAB — COMPREHENSIVE METABOLIC PANEL
ALT: 7 U/L (ref 0–53)
AST: 11 U/L (ref 0–37)
Albumin: 3.8 g/dL (ref 3.5–5.2)
Alkaline Phosphatase: 80 U/L (ref 39–117)
BUN: 28 mg/dL — ABNORMAL HIGH (ref 6–23)
CALCIUM: 9.5 mg/dL (ref 8.4–10.5)
CHLORIDE: 98 meq/L (ref 96–112)
CO2: 29 meq/L (ref 19–32)
CREATININE: 1.24 mg/dL (ref 0.40–1.50)
GFR: 59.34 mL/min — ABNORMAL LOW (ref >60.00)
Glucose, Bld: 131 mg/dL — ABNORMAL HIGH (ref 70–99)
POTASSIUM: 4.4 meq/L (ref 3.5–5.1)
Sodium: 134 mEq/L — ABNORMAL LOW (ref 135–145)
Total Bilirubin: 1 mg/dL (ref 0.2–1.2)
Total Protein: 7.8 g/dL (ref 6.0–8.3)

## 2016-02-04 LAB — CBC WITH DIFFERENTIAL/PLATELET
BASOS PCT: 0.4 % (ref 0.0–3.0)
Basophils Absolute: 0 10*3/uL (ref 0.0–0.1)
EOS ABS: 0.2 10*3/uL (ref 0.0–0.7)
EOS PCT: 2.1 % (ref 0.0–5.0)
HEMATOCRIT: 41.2 % (ref 39.0–52.0)
Hemoglobin: 13.8 g/dL (ref 13.0–17.0)
LYMPHS PCT: 20 % (ref 12.0–46.0)
Lymphs Abs: 1.7 10*3/uL (ref 0.7–4.0)
MCHC: 33.5 g/dL (ref 30.0–36.0)
MCV: 90.4 fl (ref 78.0–100.0)
MONO ABS: 1.3 10*3/uL — AB (ref 0.1–1.0)
Monocytes Relative: 15.5 % — ABNORMAL HIGH (ref 3.0–12.0)
NEUTROS ABS: 5.3 10*3/uL (ref 1.4–7.7)
Neutrophils Relative %: 62 % (ref 43.0–77.0)
PLATELETS: 143 10*3/uL — AB (ref 150.0–400.0)
RBC: 4.55 Mil/uL (ref 4.22–5.81)
RDW: 13.9 % (ref 11.5–15.5)
WBC: 8.5 10*3/uL (ref 4.0–10.5)

## 2016-02-04 LAB — HEMOGLOBIN A1C: HEMOGLOBIN A1C: 6.8 % — AB (ref 4.6–6.5)

## 2016-02-04 LAB — LDL CHOLESTEROL, DIRECT: LDL DIRECT: 66 mg/dL

## 2016-02-04 NOTE — Patient Instructions (Addendum)
Stop by lab before you leave  Reduce amlodipine to 2.5 mg (try to cut pill in half). If it is not scored- call me and I will call in the smaller pill.   Check blood pressure at home for me and update me in 2 weeks. Goal <140/90. Also let me know if that helps with the fatigue at all.   I would also like for you to sign up for an annual wellness visit on a Friday with our nurse Darl Pikes sometime between now and next visit. This is a free benefit under medicare that may help [place] find additional ways to help you.

## 2016-02-04 NOTE — Assessment & Plan Note (Signed)
S: controlled. On lasix 40mg , irbesartan 300mg ,  Metoprolol 75 mg BID, amlodipine 5mg  BP Readings from Last 3 Encounters:  02/04/16 108/76  10/25/15 140/82  08/06/15 130/82  A/P:Continue current meds:  Reduce amlodipine to 2.5mg  for trial with BP goal <140/90 and home monitoring

## 2016-02-04 NOTE — Assessment & Plan Note (Signed)
S: wonder if undercontrolled- more cold intolerance and more fatigue Lab Results  Component Value Date   TSH 0.97 08/06/2015   On thyroid medication-levothyroxine A/P: update tsh today

## 2016-02-04 NOTE — Progress Notes (Signed)
Subjective:  [name] is a 82 y.o. year old very pleasant male patient who presents for/with See problem oriented charting ROS- does have some fatigue. No hair or nail changes. Some cold intolerance.Denies shakiness or anxiety. .see any ROS included in HPI as well.   Past Medical History-  Patient Active Problem List   Diagnosis Date Noted  . CAD (coronary artery disease) 06/24/2014    Priority: High  . Diabetes mellitus type II, uncontrolled (HCC) 06/23/2014    Priority: High  . Chronic systolic heart failure (HCC) 09/25/2009    Priority: High  . Automatic implantable cardioverter-defibrillator in situ 09/25/2009    Priority: High  . Atrial fibrillation (HCC) 02/12/2007    Priority: High  . Balance problem 08/02/2014    Priority: Medium  . CKD (chronic kidney disease), stage II 12/07/2007    Priority: Medium  . Hypothyroidism 02/12/2007    Priority: Medium  . Hyperlipidemia 02/12/2007    Priority: Medium  . Essential hypertension 02/12/2007    Priority: Medium  . Encounter for therapeutic drug monitoring 10/06/2013    Priority: Low  . Back pain, chronic 08/10/2009    Priority: Low  . Localized osteoarthrosis, lower leg 04/13/2007    Priority: Low    Medications- reviewed and updated Current Outpatient Prescriptions  Medication Sig Dispense Refill  . amLODipine (NORVASC) 5 MG tablet Take 1 tablet (5 mg total) by mouth daily. 90 tablet 1  . aspirin 81 MG tablet Take 81 mg by mouth daily.      Marland Kitchen atorvastatin (LIPITOR) 20 MG tablet TAKE 1 TABLET ONCE DAILY. 30 tablet 5  . calcium carbonate (OS-CAL) 600 MG TABS Take 1 tablet (600 mg total) by mouth daily. 60 tablet   . furosemide (LASIX) 40 MG tablet Take 1 tablet (40 mg total) by mouth daily. 30 tablet 11  . irbesartan (AVAPRO) 300 MG tablet TAKE 1 TABLET ONCE DAILY. 30 tablet 5  . levothyroxine (SYNTHROID, LEVOTHROID) 100 MCG tablet TAKE 1 TABLET ONCE DAILY BEFORE BREAKFAST. 90 tablet 1  . metFORMIN (GLUCOPHAGE) 500  MG tablet TAKE 1 TABLET EACH DAY. 30 tablet 5  . metoprolol (LOPRESSOR) 50 MG tablet Take 1.5 tablets (75 mg total) by mouth 2 (two) times daily. 90 tablet 11  . potassium chloride SA (K-DUR,KLOR-CON) 20 MEQ tablet Take 1 tablet (20 mEq total) by mouth daily. 30 tablet 11  . warfarin (COUMADIN) 2 MG tablet TAKE AS DIRECTED. 35 tablet 6   No current facility-administered medications for this visit.    Objective: BP 108/76 mmHg  Pulse 70  Temp(Src) 97.9 F (36.6 C) (Oral)  Ht  (1.651 m)  Wt 160 lb (72.576 kg)  BMI 26.63 kg/m2  SpO2 93% Gen: NAD, resting comfortably CV: RRR no  rubs or gallops Lungs: CTAB no crackles, wheeze, rhonchi Abdomen: soft/nontender/nondistended/normal bowel sounds. No rebound or guarding.  Ext: no edema Skin: warm, dry Neuro: grossly normal, moves all extremities  Assessment/Plan:  Diabetes mellitus type II, uncontrolled S: well controlled. On metformin  daily CBGs- does not check Lab Results  Component Value Date   HGBA1C 6.8* 08/06/2015   HGBA1C 6.8* 02/02/2015   HGBA1C 8.1* 10/04/2014   A/P: update a1c today  Chronic systolic heart failure S: HF stable on lasix  daily and also on avapro . No recent weight gain or worsening edema A/P: continue current dose of medications, update labs  Essential hypertension S: controlled. On lasix , irbesartan ,  Metoprolol 75 mg BID, amlodipine  BP  Readings from Last 3 Encounters:  02/04/16 108/76  10/25/15 140/82  08/06/15 130/82  A/P:Continue current meds:  Reduce amlodipine to 2.5mg  for trial with BP goal <140/90 and home monitoring   Hyperlipidemia S: well controlled on atorvastatin 20mg . No myalgias.   A/P: could try to push for LDL <70, update ldl  Hypothyroidism S: wonder if undercontrolled- more cold intolerance and more fatigue Lab Results  Component Value Date   TSH 0.97 08/06/2015   On thyroid medication-levothyroxine A/P: update tsh  today    Return in about 6 months (around 08/05/2016) for physical. Return precautions advised.   Orders Placed This Encounter  Procedures  . CBC with Differential/Platelet  . Comprehensive metabolic panel    [place]  . LDL cholesterol, direct    [place]  . Hemoglobin A1c    [place]  . TSH    [place]    Tana Conch, MD

## 2016-02-04 NOTE — Assessment & Plan Note (Signed)
S: well controlled. On metformin 500mg  daily CBGs- does not check Lab Results  Component Value Date   HGBA1C 6.8* 08/06/2015   HGBA1C 6.8* 02/02/2015   HGBA1C 8.1* 10/04/2014   A/P: update a1c today

## 2016-02-04 NOTE — Assessment & Plan Note (Signed)
S: well controlled on atorvastatin 20mg . No myalgias.   A/P: could try to push for LDL <70, update ldl

## 2016-02-04 NOTE — Progress Notes (Signed)
Pre visit review using our clinic review tool, if applicable. No additional management support is needed unless otherwise documented below in the visit note. 

## 2016-02-04 NOTE — Assessment & Plan Note (Signed)
S: HF stable on lasix 40mg  daily and also on avapro 300mg . No recent weight gain or worsening edema A/P: continue current dose of medications, update labs

## 2016-02-05 ENCOUNTER — Other Ambulatory Visit: Payer: Self-pay | Admitting: Family Medicine

## 2016-02-05 LAB — TSH: TSH: 1.11 u[IU]/mL (ref 0.35–4.50)

## 2016-02-05 MED ORDER — AMLODIPINE BESYLATE 2.5 MG PO TABS: 2.5000 mg | ORAL_TABLET | Freq: Every day | ORAL | Status: DC

## 2016-02-05 NOTE — Telephone Encounter (Signed)
Pt's wife notified R

## 2016-02-05 NOTE — Telephone Encounter (Signed)
Wife states she is unable to cut the amLODipine (NORVASC) 5 MG tablet In half.  Pt will need a new rx sent to  [facility]  For the 2.5 mg dose

## 2016-02-05 NOTE — Telephone Encounter (Signed)
Pt's wife notified Rx sent to pharmacy as requested. Mrs Yeck verbalized understanding and said she will go pick it up.

## 2016-02-08 LAB — CUP PACEART REMOTE DEVICE CHECK
Battery Remaining Longevity: 42 mo
Brady Statistic RA Percent Paced: 0 %
Date Time Interrogation Session: 20170616110019
HighPow Impedance: 58 Ohm
Implantable Lead Implant Date: 20060718
Implantable Lead Location: 753858
Implantable Lead Location: 753860
Implantable Lead Model: 4087
Implantable Lead Model: 4196
Implantable Lead Serial Number: 250893
Lead Channel Impedance Value: 551 Ohm
Lead Channel Sensing Intrinsic Amplitude: 18.4 mV
Lead Channel Setting Pacing Amplitude: 2 V
Lead Channel Setting Pacing Pulse Width: 0.4 ms
Lead Channel Setting Pacing Pulse Width: 0.4 ms
MDC IDC LEAD IMPLANT DT: 20060718
MDC IDC LEAD IMPLANT DT: 20110902
MDC IDC LEAD LOCATION: 753859
MDC IDC LEAD MODEL: 158
MDC IDC LEAD SERIAL: 164746
MDC IDC MSMT LEADCHNL RA IMPEDANCE VALUE: 537 Ohm
MDC IDC MSMT LEADCHNL RA SENSING INTR AMPL: 0.9 mV
MDC IDC MSMT LEADCHNL RV IMPEDANCE VALUE: 512 Ohm
MDC IDC MSMT LEADCHNL RV SENSING INTR AMPL: 25 mV
MDC IDC SET LEADCHNL LV SENSING SENSITIVITY: 1 mV
MDC IDC SET LEADCHNL RV PACING AMPLITUDE: 2.5 V
MDC IDC SET LEADCHNL RV SENSING SENSITIVITY: 0.5 mV
MDC IDC STAT BRADY RV PERCENT PACED: 97 %
Pulse Gen Serial Number: 480118

## 2016-02-13 ENCOUNTER — Encounter: Payer: Self-pay | Admitting: Cardiology

## 2016-02-13 ENCOUNTER — Ambulatory Visit (INDEPENDENT_AMBULATORY_CARE_PROVIDER_SITE_OTHER): Payer: Medicare Other | Admitting: General Practice

## 2016-02-13 DIAGNOSIS — Z5181 Encounter for therapeutic drug level monitoring: Secondary | ICD-10-CM | POA: Diagnosis not present

## 2016-02-13 DIAGNOSIS — I4891 Unspecified atrial fibrillation: Secondary | ICD-10-CM

## 2016-02-13 LAB — POCT INR: INR: 1.9

## 2016-02-13 NOTE — Progress Notes (Signed)
Pre visit review using our clinic review tool, if applicable. No additional management support is needed unless otherwise documented below in the visit note. 

## 2016-03-11 ENCOUNTER — Other Ambulatory Visit: Payer: Self-pay | Admitting: Family Medicine

## 2016-03-12 ENCOUNTER — Ambulatory Visit: Payer: Medicare Other

## 2016-03-13 ENCOUNTER — Other Ambulatory Visit: Payer: Self-pay | Admitting: General Practice

## 2016-03-13 MED ORDER — WARFARIN SODIUM 2 MG PO TABS: ORAL_TABLET | ORAL | Status: DC

## 2016-03-17 ENCOUNTER — Ambulatory Visit (INDEPENDENT_AMBULATORY_CARE_PROVIDER_SITE_OTHER): Payer: Medicare Other | Admitting: General Practice

## 2016-03-17 DIAGNOSIS — Z5181 Encounter for therapeutic drug level monitoring: Secondary | ICD-10-CM | POA: Diagnosis not present

## 2016-03-17 DIAGNOSIS — I4891 Unspecified atrial fibrillation: Secondary | ICD-10-CM | POA: Diagnosis not present

## 2016-03-17 LAB — POCT INR: INR: 3.3

## 2016-04-14 ENCOUNTER — Ambulatory Visit: Payer: Medicare Other

## 2016-04-14 ENCOUNTER — Ambulatory Visit (INDEPENDENT_AMBULATORY_CARE_PROVIDER_SITE_OTHER): Payer: Medicare Other | Admitting: General Practice

## 2016-04-14 DIAGNOSIS — I4891 Unspecified atrial fibrillation: Secondary | ICD-10-CM | POA: Diagnosis not present

## 2016-04-14 DIAGNOSIS — Z5181 Encounter for therapeutic drug level monitoring: Secondary | ICD-10-CM

## 2016-04-14 LAB — POCT INR: INR: 2.3

## 2016-04-17 NOTE — Progress Notes (Addendum)
Subjective:   [name] is a 82 y.o. male who presents for Medicare Annual/Subsequent preventive examination.  HRA assessment completed during this visit with [name]   The Patient was informed that the wellness visit is to identify future health risk and educate and initiate measures that can reduce risk for increased disease through the lifespan.    NO ROS; Medicare Wellness Visit PMH: S/p AVR and aortoplasty HTN; medically managed  Hyperlipidemia / HDL 27; LDL 66;  Renal insufficiency  DM2 A1c 6.8  Atrial fib;  Problem with right knee; swells up at times;  It will clear up;  Walking with a cane x  1 year; Went to PT and no more falls   Last OV: Reduce amlodipine to 2.5mg  for trial with BP goal <140/90 and home monitoring bP from 6/14 through 7/3 were 120/ 80 approx; fairly stable  8/25; 132/ 83  C/o about fatigue and Dr. Reduced one of his pills  BP good since change in med 6/13 88/67 6/14 118/73 6/15 133/81 6/16 108/73  6/17 88/59 6/18 118/66 6/19 116/ 66 6/20 120/75 6/21 106/ 74 6/23 126/72 8/25; 132/ 83 / the same at assessment   Psychosocial: Mother had HD; Lung disease; Father had MI  Brother had stroke and other brother had HTN)  Tobacco quit 08/1968; 82 yo  EtOH; likes a glass of wine at hs; sometimes a beer   Goes to bed at 9am - gets up 8 or 9 am States he sleeps well   Medications reviewed for issues; compliance; otc meds Wife prepares meds in  Pill box and sometimes he still misses meds   BMI: 28   Diet- appetite is poor  Makes a late breakfast; Brunch  Supper; I can eat supper / meat and 2 vegetables  Discussed smoothies; V8; instant breakfast  Goes to the farmers market every Sat.  Exercise;  sedentary; Stopped working in the yard when he fell  Used to walk over the neighborhood; no interest in doing this Has not fup on PT exercises   HOME SAFETY Single family ;one level; steps are outside  Very good in and out of home  [employer] came out His just has a shower; had grab bars put in and stool Dresses and shaves Independently   Fall hx; no falls since last Thanksgiving  Fear of falling- careful; uses cane Given education on "Fall Prevention in the Home" for more safety tips the patient can apply as appropriate.   Personal safety issues reviewed:  1.  for risk such as safe community yes 2.  smoke detector/ yes 3.  firearms safety if applicable 4. protection when in the sun; yes 5. driving safety for seniors or any recent accidents.no   Risk for Depression reviewed: Any emotional problems? Anxious, depressed, irritable, sad or blue? States he is not but affect not bright and engaging; low energy; does want to get out. Wife states he does not "do anything".   Denies feeling depressed or hopeless; voices pleasure in daily life How many social activities have you been engaged in within the last 2 weeks? Used to like to travel;  Discusses self image with a cane. Loss of brothers; 5 brothers  and 2 are left.  Discusses reunion and [age] and what it is like to be [age] States he just gets "bored" and disinterested Agreed to come in and discuss his mood with Dr. Durene CalHunter. Informed he may benefit from medication to help his mood and  energy and agreed today he will discuss. May be some memory issues impacting mood;   Cognitive; + today for lack of focus; 100- 7 (95 would be 5; but could not factor 7)  Serial 3s from 20 (1/5) "20-3 =17; 17-3 = 20) Recall 2/ of 3; Failed picture; score 24/30;  Misses meds after wife pours them at times  Advanced Directive addressed; completed    Counseling Health Maintenance Gaps: Flu; agreed to take high does flu shot today   Colonoscopy;  03/2007 ; aged out EKG: 10/2015 Prostate cancer screening: deferred    Hearing: Several years ago he went to audiologist   Ophthalmology exam Du 11/2016 Immunizations Due: (Vaccines reviewed and educated regarding any overdue)     Established and updated Risk reviewed and appropriate referral made or health recommendations:  Current Care Team reviewed and updat Cardiac Risk Factors include: advanced age (>67men, >53 women);diabetes mellitus;dyslipidemia;family history of premature cardiovascular disease;hypertension;male gender;sedentary lifestyle     Objective:    Vitals: BP 130/70   Pulse 71   Ht 5\' 4"  (1.626 m)   Wt 166 lb 2 oz (75.4 kg)   SpO2 96%   BMI 28.52 kg/m   Body mass index is 28.52 kg/m.  Tobacco History  Smoking Status  . Former Smoker  . Quit date: 08/25/1968  Smokeless Tobacco  . Never Used    Comment: "burned up more than he smoked"      Counseling given: Yes   Past Medical History:  Diagnosis Date  . Aortic valve disease   . Cardiomyopathy, nonischemic (HCC)   . Cellulitis and abscess of upper arm and forearm 12/11/2008   Qualifier: Diagnosis of  By: Lovell Sheehan MD, Balinda Quails   . CHF (congestive heart failure) (HCC)   . Chronic renal insufficiency   . Defibrillator activation   . DIVERTICULOSIS OF COLON 03/31/2007   Qualifier: Diagnosis of  By: Creta Levin CMA (AAMA), Robin    . Hyperlipidemia   . Hypertension   . LBBB (left bundle branch block)   . Long term (current) use of anticoagulants   . S/P ablation of atrial fibrillation   . S/P AVR (aortic valve replacement) and aortoplasty   . Tachycardia induced cardiomyopathy (HCC)   . Thyroid disease    Past Surgical History:  Procedure Laterality Date  . AORTIC VALVE SURGERY  1992   [employer] Jude Valve  . CARDIAC CATHETERIZATION  12/04/04  . CORONARY ARTERY BYPASS GRAFT    . pacmaker,defibr  2006   Family History  Problem Relation Age of Onset  . Heart disease Mother   . Lung disease Mother   . Heart attack Father   . Stroke Brother   . Hypertension Brother    History  Sexual Activity  . Sexual activity: Yes    Outpatient Encounter Prescriptions as of 04/18/2016  Medication Sig  . amLODipine (NORVASC) 2.5 MG tablet Take  1 tablet (2.5 mg total) by mouth daily.  Marland Kitchen aspirin 81 MG tablet Take 81 mg by mouth daily.    Marland Kitchen atorvastatin (LIPITOR) 20 MG tablet TAKE 1 TABLET ONCE DAILY.  . calcium carbonate (OS-CAL) 600 MG TABS Take 1 tablet (600 mg total) by mouth daily.  . furosemide (LASIX) 40 MG tablet Take 1 tablet (40 mg total) by mouth daily.  . irbesartan (AVAPRO) 300 MG tablet TAKE 1 TABLET ONCE DAILY.  Marland Kitchen levothyroxine (SYNTHROID, LEVOTHROID) 100 MCG tablet TAKE 1 TABLET ONCE DAILY BEFORE BREAKFAST.  . metFORMIN (GLUCOPHAGE) 500 MG tablet TAKE 1  TABLET EACH DAY.  . metoprolol (LOPRESSOR) 50 MG tablet Take 1.5 tablets (75 mg total) by mouth 2 (two) times daily.  . potassium chloride SA (K-DUR,KLOR-CON) 20 MEQ tablet Take 1 tablet (20 mEq total) by mouth daily.  Marland Kitchen warfarin (COUMADIN) 2 MG tablet TAKE AS DIRECTED.   No facility-administered encounter medications on file as of 04/18/2016.     Activities of Daily Living In your present state of health, do you have any difficulty performing the following activities: 04/18/2016  Hearing? N  Vision? N  Difficulty concentrating or making decisions? Y  Walking or climbing stairs? Y  Dressing or bathing? N  Doing errands, shopping? N  Preparing Food and eating ? N  Using the Toilet? N  In the past six months, have you accidently leaked urine? N  Do you have problems with loss of bowel control? N  Managing your Medications? N  Managing your Finances? N  Housekeeping or managing your Housekeeping? Y  Some recent data might be hidden    Patient Care Team: Shelva Majestic, MD as PCP - General (Family Medicine) Marinus Maw, MD (Cardiology)   Assessment:     Exercise Activities and Dietary recommendations Current Exercise Habits: Home exercise routine (does not exercise )  Goals    . patient          Discussed exercises that you were taught in PT      Fall Risk Fall Risk  04/18/2016 02/02/2015 11/21/2013 05/23/2013 10/18/2012  Falls in the past year? Yes  No No No Yes  Number falls in past yr: 1 - - - -  Risk for fall due to : - - - - Impaired balance/gait  Risk for fall due to (comments): - - - - fractured arm  Follow up Education provided - - - -   Depression Screen PHQ 2/9 Scores 04/18/2016 02/02/2015 11/21/2013 05/23/2013  PHQ - 2 Score 3 0 0 1  PHQ- 9 Score 13 - - -    Cognitive Testing MMSE - Mini Mental State Exam 04/18/2016  Orientation to time 5  Orientation to Place 5  Registration 3  Attention/ Calculation 1  Recall 2  Language- name 2 objects 2  Language- repeat 1  Language- follow 3 step command 3  Language- read & follow direction 1  Write a sentence 1  Copy design 0  Total score 24    Immunization History  Administered Date(s) Administered  . Influenza Split 06/08/2012  . Influenza Whole 07/08/2007, 06/14/2008, 05/16/2009, 05/29/2010  . Influenza, High Dose Seasonal PF 05/28/2015, 04/18/2016  . Influenza,inj,Quad PF,36+ Mos 05/23/2013, 06/23/2014  . Pneumococcal Conjugate-13 07/03/2014  . Pneumococcal Polysaccharide-23 08/06/2015  . Tetanus 10/04/2014   Screening Tests Health Maintenance  Topic Date Due  . INFLUENZA VACCINE  03/25/2016  . ZOSTAVAX  10/24/2019 (Originally 04/02/1994)  . FOOT EXAM  08/05/2016  . HEMOGLOBIN A1C  08/05/2016  . OPHTHALMOLOGY EXAM  12/09/2016  . TETANUS/TDAP  10/04/2024  . PNA vac Low Risk Adult  Completed      Plan:     To fup with Dr. Durene Cal regarding moood  Agreed to take flu shot today  MMSE 24/30 Depression PHQ 13  During the course of the visit the patient was educated and counseled about the following appropriate screening and preventive services:   Vaccines to include Pneumoccal, Influenza, Hepatitis B, Td, Zostavax, HCV  Electrocardiogram  Cardiovascular Disease  Colorectal cancer screening  Diabetes screening  Prostate Cancer Screening  Glaucoma screening  Nutrition counseling   Smoking cessation counseling  Patient Instructions (the written  plan) was given to the patient.    Montine Circle, RN  04/18/2016

## 2016-04-18 ENCOUNTER — Ambulatory Visit (INDEPENDENT_AMBULATORY_CARE_PROVIDER_SITE_OTHER): Payer: Medicare Other

## 2016-04-18 DIAGNOSIS — Z23 Encounter for immunization: Secondary | ICD-10-CM

## 2016-04-18 DIAGNOSIS — Z0000 Encounter for general adult medical examination without abnormal findings: Secondary | ICD-10-CM

## 2016-04-18 NOTE — Progress Notes (Signed)
I have reviewed and agree with note, evaluation, plan. Asher Muir- can we make sure to schedule a follow up to discuss phq9 of 13? Need to discuss potential depression. MMSE decreased but could be due to depression as well.   Tana Conch, MD

## 2016-04-18 NOTE — Patient Instructions (Addendum)
[name] , Thank you for taking time to come for your Medicare Wellness Visit. I appreciate your ongoing commitment to your health goals. Please review the following plan we discussed and let me know if I can assist you in the future.   Will apt with Dr. Durene Cal to discuss life changes   [facility] - can fup for free hearing aid  No reviews  [facility]  [address] [place] [address]  [phone] ( will send to the patient with BPs taken from June )      These are the goals we discussed: Goals    . patient          Discussed exercises that you were taught in PT       This is a list of the screening recommended for you and due dates:  Health Maintenance  Topic Date Due  . Flu Shot  03/25/2016  . Shingles Vaccine  10/24/2019*  . Complete foot exam   08/05/2016  . Hemoglobin A1C  08/05/2016  . Eye exam for diabetics  12/09/2016  . Tetanus Vaccine  10/04/2024  . Pneumonia vaccines  Completed  *Topic was postponed. The date shown is not the original due date.      Fall Prevention in the Home  Falls can cause injuries. They can happen to people of all ages. There are many things you can do to make your home safe and to help prevent falls.  WHAT CAN I DO ON THE OUTSIDE OF MY HOME?  Regularly fix the edges of walkways and driveways and fix any cracks.  Remove anything that might make you trip as you walk through a door, such as a raised step or threshold.  Trim any bushes or trees on the path to your home.  Use bright outdoor lighting.  Clear any walking paths of anything that might make someone trip, such as rocks or tools.  Regularly check to see if handrails are loose or broken. Make sure that both sides of any steps have handrails.  Any raised decks and porches should have guardrails on the edges.  Have any leaves, snow, or ice cleared regularly.  Use sand or salt on walking paths during winter.  Clean up any spills in  your garage right away. This includes oil or grease spills. WHAT CAN I DO IN THE BATHROOM?   Use night lights.  Install grab bars by the toilet and in the tub and shower. Do not use towel bars as grab bars.  Use non-skid mats or decals in the tub or shower.  If you need to sit down in the shower, use a plastic, non-slip stool.  Keep the floor dry. Clean up any water that spills on the floor as soon as it happens.  Remove soap buildup in the tub or shower regularly.  Attach bath mats securely with double-sided non-slip rug tape.  Do not have throw rugs and other things on the floor that can make you trip. WHAT CAN I DO IN THE BEDROOM?  Use night lights.  Make sure that you have a light by your bed that is easy to reach.  Do not use any sheets or blankets that are too big for your bed. They should not hang down onto the floor.  Have a firm chair that has side arms. You can use this for support while you get dressed.  Do not have throw rugs and other things on the  floor that can make you trip. WHAT CAN I DO IN THE KITCHEN?  Clean up any spills right away.  Avoid walking on wet floors.  Keep items that you use a lot in easy-to-reach places.  If you need to reach something above you, use a strong step stool that has a grab bar.  Keep electrical cords out of the way.  Do not use floor polish or wax that makes floors slippery. If you must use wax, use non-skid floor wax.  Do not have throw rugs and other things on the floor that can make you trip. WHAT CAN I DO WITH MY STAIRS?  Do not leave any items on the stairs.  Make sure that there are handrails on both sides of the stairs and use them. Fix handrails that are broken or loose. Make sure that handrails are as long as the stairways.  Check any carpeting to make sure that it is firmly attached to the stairs. Fix any carpet that is loose or worn.  Avoid having throw rugs at the top or bottom of the stairs. If you do have  throw rugs, attach them to the floor with carpet tape.  Make sure that you have a light switch at the top of the stairs and the bottom of the stairs. If you do not have them, ask someone to add them for you. WHAT ELSE CAN I DO TO HELP PREVENT FALLS?  Wear shoes that:  Do not have high heels.  Have rubber bottoms.  Are comfortable and fit you well.  Are closed at the toe. Do not wear sandals.  If you use a stepladder:  Make sure that it is fully opened. Do not climb a closed stepladder.  Make sure that both sides of the stepladder are locked into place.  Ask someone to hold it for you, if possible.  Clearly mark and make sure that you can see:  Any grab bars or handrails.  First and last steps.  Where the edge of each step is.  Use tools that help you move around (mobility aids) if they are needed. These include:  Canes.  Walkers.  Scooters.  Crutches.  Turn on the lights when you go into a dark area. Replace any light bulbs as soon as they burn out.  Set up your furniture so you have a clear path. Avoid moving your furniture around.  If any of your floors are uneven, fix them.  If there are any pets around you, be aware of where they are.  Review your medicines with your doctor. Some medicines can make you feel dizzy. This can increase your chance of falling. Ask your doctor what other things that you can do to help prevent falls.   This information is not intended to replace advice given to you by your health care provider. Make sure you discuss any questions you have with your health care provider.   Document Released: 06/07/2009 Document Revised: 12/26/2014 Document Reviewed: 09/15/2014 Elsevier Interactive Patient Education 2016 [employer].  Health Maintenance, Male A healthy lifestyle and preventative care can promote health and wellness.  Maintain regular health, dental, and eye exams.  Eat a healthy diet. Foods like vegetables, fruits, whole  grains, low-fat dairy products, and lean protein foods contain the nutrients you need and are low in calories. Decrease your intake of foods high in solid fats, added sugars, and salt. Get information about a proper diet from your health care provider, if necessary.  Regular physical exercise  is one of the most important things you can do for your health. Most adults should get at least 150 minutes of moderate-intensity exercise (any activity that increases your heart rate and causes you to sweat) each week. In addition, most adults need muscle-strengthening exercises on 2 or more days a week.   Maintain a healthy weight. The body mass index (BMI) is a screening tool to identify possible weight problems. It provides an estimate of body fat based on height and weight. Your health care provider can find your BMI and can help you achieve or maintain a healthy weight. For males 20 years and older:  A BMI below 18.5 is considered underweight.  A BMI of 18.5 to 24.9 is normal.  A BMI of 25 to 29.9 is considered overweight.  A BMI of 30 and above is considered obese.  Maintain normal blood lipids and cholesterol by exercising and minimizing your intake of saturated fat. Eat a balanced diet with plenty of fruits and vegetables. Blood tests for lipids and cholesterol should begin at age 80 and be repeated every 5 years. If your lipid or cholesterol levels are high, you are over age 80, or you are at high risk for heart disease, you may need your cholesterol levels checked more frequently.Ongoing high lipid and cholesterol levels should be treated with medicines if diet and exercise are not working.  If you smoke, find out from your health care provider how to quit. If you do not use tobacco, do not start.  Lung cancer screening is recommended for adults aged 55-80 years who are at high risk for developing lung cancer because of a history of smoking. A yearly low-dose CT scan of the lungs is recommended  for people who have at least a 30-pack-year history of smoking and are current smokers or have quit within the past 15 years. A pack year of smoking is smoking an average of 1 pack of cigarettes a day for 1 year (for example, a 30-pack-year history of smoking could mean smoking 1 pack a day for 30 years or 2 packs a day for 15 years). Yearly screening should continue until the smoker has stopped smoking for at least 15 years. Yearly screening should be stopped for people who develop a health problem that would prevent them from having lung cancer treatment.  If you choose to drink alcohol, do not have more than 2 drinks per day. One drink is considered to be 12 oz (360 mL) of beer, 5 oz (150 mL) of wine, or 1.5 oz (45 mL) of liquor.  Avoid the use of street drugs. Do not share needles with anyone. Ask for help if you need support or instructions about stopping the use of drugs.  High blood pressure causes heart disease and increases the risk of stroke. High blood pressure is more likely to develop in:  People who have blood pressure in the end of the normal range (100-139/85-89 mm Hg).  People who are overweight or obese.  People who are African American.  If you are 6718-639 years of age, have your blood pressure checked every 3-5 years. If you are 80 years of age or older, have your blood pressure checked every year. You should have your blood pressure measured twice--once when you are at a hospital or clinic, and once when you are not at a hospital or clinic. Record the average of the two measurements. To check your blood pressure when you are not at a hospital or clinic, you  can use:  An automated blood pressure machine at a pharmacy.  A home blood pressure monitor.  If you are 54-34 years old, ask your health care provider if you should take aspirin to prevent heart disease.  Diabetes screening involves taking a blood sample to check your fasting blood sugar level. This should be done once  every 3 years after age 43 if you are at a normal weight and without risk factors for diabetes. Testing should be considered at a younger age or be carried out more frequently if you are overweight and have at least 1 risk factor for diabetes.  Colorectal cancer can be detected and often prevented. Most routine colorectal cancer screening begins at the age of 71 and continues through age 33. However, your health care provider may recommend screening at an earlier age if you have risk factors for colon cancer. On a yearly basis, your health care provider may provide home test kits to check for hidden blood in the stool. A small camera at the end of a tube may be used to directly examine the colon (sigmoidoscopy or colonoscopy) to detect the earliest forms of colorectal cancer. Talk to your health care provider about this at age 78 when routine screening begins. A direct exam of the colon should be repeated every 5-10 years through age 83, unless early forms of precancerous polyps or small growths are found.  People who are at an increased risk for hepatitis B should be screened for this virus. You are considered at high risk for hepatitis B if:  You were born in a country where hepatitis B occurs often. Talk with your health care provider about which countries are considered high risk.  Your parents were born in a high-risk country and you have not received a shot to protect against hepatitis B (hepatitis B vaccine).  You have HIV or AIDS.  You use needles to inject street drugs.  You live with, or have sex with, someone who has hepatitis B.  You are a man who has sex with other men (MSM).  You get hemodialysis treatment.  You take certain medicines for conditions like cancer, organ transplantation, and autoimmune conditions.  Hepatitis C blood testing is recommended for all people born from [birth year] through 1963 and any individual with known risk factors for hepatitis C.  Healthy men should no  longer receive prostate-specific antigen (PSA) blood tests as part of routine cancer screening. Talk to your health care provider about prostate cancer screening.  Testicular cancer screening is not recommended for adolescents or adult males who have no symptoms. Screening includes self-exam, a health care provider exam, and other screening tests. Consult with your health care provider about any symptoms you have or any concerns you have about testicular cancer.  Practice safe sex. Use condoms and avoid high-risk sexual practices to reduce the spread of sexually transmitted infections (STIs).  You should be screened for STIs, including gonorrhea and chlamydia if:  You are sexually active and are younger than 24 years.  You are older than 24 years, and your health care provider tells you that you are at risk for this type of infection.  Your sexual activity has changed since you were last screened, and you are at an increased risk for chlamydia or gonorrhea. Ask your health care provider if you are at risk.  If you are at risk of being infected with HIV, it is recommended that you take a prescription medicine daily to prevent HIV infection. This  is called pre-exposure prophylaxis (PrEP). You are considered at risk if:  You are a man who has sex with other men (MSM).  You are a heterosexual man who is sexually active with multiple partners.  You take drugs by injection.  You are sexually active with a partner who has HIV.  Talk with your health care provider about whether you are at high risk of being infected with HIV. If you choose to begin PrEP, you should first be tested for HIV. You should then be tested every 3 months for as long as you are taking PrEP.  Use sunscreen. Apply sunscreen liberally and repeatedly throughout the day. You should seek shade when your shadow is shorter than you. Protect yourself by wearing long sleeves, pants, a wide-brimmed hat, and sunglasses year round  whenever you are outdoors.  Tell your health care provider of new moles or changes in moles, especially if there is a change in shape or color. Also, tell your health care provider if a mole is larger than the size of a pencil eraser.  A one-time screening for abdominal aortic aneurysm (AAA) and surgical repair of large AAAs by ultrasound is recommended for men aged 65-75 years who are current or former smokers.  Stay current with your vaccines (immunizations).   This information is not intended to replace advice given to you by your health care provider. Make sure you discuss any questions you have with your health care provider.   Document Released: 02/07/2008 Document Revised: 09/01/2014 Document Reviewed: 01/06/2011 Elsevier Interactive Patient Education [employer]

## 2016-04-23 NOTE — Progress Notes (Signed)
Pt is scheduled 05/13/16 @ 2:45pm for follow up of mood

## 2016-04-24 ENCOUNTER — Ambulatory Visit (INDEPENDENT_AMBULATORY_CARE_PROVIDER_SITE_OTHER): Payer: Medicare Other | Admitting: *Deleted

## 2016-04-24 DIAGNOSIS — Z95810 Presence of automatic (implantable) cardiac defibrillator: Secondary | ICD-10-CM

## 2016-04-24 DIAGNOSIS — I482 Chronic atrial fibrillation, unspecified: Secondary | ICD-10-CM

## 2016-04-24 NOTE — Progress Notes (Signed)
Remote ICD transmission.   

## 2016-04-25 ENCOUNTER — Encounter: Payer: Self-pay | Admitting: Cardiology

## 2016-04-29 LAB — CUP PACEART REMOTE DEVICE CHECK
Battery Remaining Longevity: 36 mo
Battery Remaining Percentage: 56 %
Brady Statistic RV Percent Paced: 93 %
HIGH POWER IMPEDANCE MEASURED VALUE: 55 Ohm
Implantable Lead Implant Date: 20060718
Implantable Lead Implant Date: 20110902
Implantable Lead Location: 753859
Implantable Lead Model: 158
Implantable Lead Serial Number: 164746
Lead Channel Impedance Value: 494 Ohm
Lead Channel Impedance Value: 515 Ohm
Lead Channel Pacing Threshold Amplitude: 0.9 V
Lead Channel Pacing Threshold Pulse Width: 0.4 ms
Lead Channel Setting Pacing Amplitude: 2.5 V
Lead Channel Setting Sensing Sensitivity: 0.5 mV
Lead Channel Setting Sensing Sensitivity: 1 mV
MDC IDC LEAD IMPLANT DT: 20060718
MDC IDC LEAD LOCATION: 753858
MDC IDC LEAD LOCATION: 753860
MDC IDC LEAD MODEL: 4087
MDC IDC LEAD MODEL: 4196
MDC IDC LEAD SERIAL: 250893
MDC IDC MSMT LEADCHNL RV IMPEDANCE VALUE: 482 Ohm
MDC IDC MSMT LEADCHNL RV PACING THRESHOLD AMPLITUDE: 0.6 V
MDC IDC MSMT LEADCHNL RV PACING THRESHOLD PULSEWIDTH: 0.4 ms
MDC IDC SESS DTM: 20170831133000
MDC IDC SET LEADCHNL LV PACING AMPLITUDE: 2 V
MDC IDC SET LEADCHNL LV PACING PULSEWIDTH: 0.4 ms
MDC IDC SET LEADCHNL RV PACING PULSEWIDTH: 0.4 ms
MDC IDC STAT BRADY RA PERCENT PACED: 0 %
Pulse Gen Serial Number: 480118

## 2016-04-30 DIAGNOSIS — H2512 Age-related nuclear cataract, left eye: Secondary | ICD-10-CM | POA: Diagnosis not present

## 2016-05-05 ENCOUNTER — Other Ambulatory Visit: Payer: Self-pay | Admitting: Family Medicine

## 2016-05-12 ENCOUNTER — Ambulatory Visit (INDEPENDENT_AMBULATORY_CARE_PROVIDER_SITE_OTHER): Payer: Medicare Other | Admitting: General Practice

## 2016-05-12 DIAGNOSIS — I4891 Unspecified atrial fibrillation: Secondary | ICD-10-CM

## 2016-05-12 DIAGNOSIS — Z5181 Encounter for therapeutic drug level monitoring: Secondary | ICD-10-CM

## 2016-05-12 LAB — POCT INR: INR: 3.5

## 2016-05-13 ENCOUNTER — Encounter: Payer: Self-pay | Admitting: Family Medicine

## 2016-05-13 ENCOUNTER — Ambulatory Visit (INDEPENDENT_AMBULATORY_CARE_PROVIDER_SITE_OTHER): Payer: Medicare Other | Admitting: Family Medicine

## 2016-05-13 VITALS — BP 110/84 | HR 70 | Temp 97.5°F | Wt 164.6 lb

## 2016-05-13 DIAGNOSIS — R413 Other amnesia: Secondary | ICD-10-CM

## 2016-05-13 DIAGNOSIS — I10 Essential (primary) hypertension: Secondary | ICD-10-CM

## 2016-05-13 NOTE — Progress Notes (Signed)
Subjective:  Michela Pitcherarl F Doutt is a 82 y.o. year old very pleasant male patient who presents for/with See problem oriented charting ROS- No chest pain or shortness of breath. No headache or blurry vision. .see any ROS included in HPI as well.   Past Medical History-  Patient Active Problem List   Diagnosis Date Noted  . CAD (coronary artery disease) 06/24/2014    Priority: High  . Diabetes mellitus type II, uncontrolled (HCC) 06/23/2014    Priority: High  . Chronic systolic heart failure (HCC) 09/25/2009    Priority: High  . Automatic implantable cardioverter-defibrillator in situ 09/25/2009    Priority: High  . Atrial fibrillation (HCC) 02/12/2007    Priority: High  . Balance problem 08/02/2014    Priority: Medium  . CKD (chronic kidney disease), stage II 12/07/2007    Priority: Medium  . Hypothyroidism 02/12/2007    Priority: Medium  . Hyperlipidemia 02/12/2007    Priority: Medium  . Essential hypertension 02/12/2007    Priority: Medium  . Encounter for therapeutic drug monitoring 10/06/2013    Priority: Low  . Back pain, chronic 08/10/2009    Priority: Low  . Localized osteoarthrosis, lower leg 04/13/2007    Priority: Low    Medications- reviewed and updated Current Outpatient Prescriptions  Medication Sig Dispense Refill  . amLODipine (NORVASC) 2.5 MG tablet Take 1 tablet (2.5 mg total) by mouth daily. 90 tablet 3  . aspirin 81 MG tablet Take 81 mg by mouth daily.      Marland Kitchen. atorvastatin (LIPITOR) 20 MG tablet TAKE 1 TABLET ONCE DAILY. 30 tablet 5  . calcium carbonate (OS-CAL) 600 MG TABS Take 1 tablet (600 mg total) by mouth daily. 60 tablet   . furosemide (LASIX) 40 MG tablet Take 1 tablet (40 mg total) by mouth daily. 30 tablet 11  . irbesartan (AVAPRO) 300 MG tablet TAKE 1 TABLET ONCE DAILY. 30 tablet 5  . levothyroxine (SYNTHROID, LEVOTHROID) 100 MCG tablet TAKE 1 TABLET ONCE DAILY BEFORE BREAKFAST. 90 tablet 1  . metFORMIN (GLUCOPHAGE) 500 MG tablet TAKE 1 TABLET EACH  DAY. 30 tablet 5  . metoprolol (LOPRESSOR) 50 MG tablet Take 1.5 tablets (75 mg total) by mouth 2 (two) times daily. 90 tablet 11  . potassium chloride SA (K-DUR,KLOR-CON) 20 MEQ tablet Take 1 tablet (20 mEq total) by mouth daily. 30 tablet 11  . warfarin (COUMADIN) 2 MG tablet TAKE AS DIRECTED. 35 tablet 6   No current facility-administered medications for this visit.     Objective: BP 110/84 (BP Location: Right Arm, Patient Position: Sitting, Cuff Size: Normal)   Pulse 70   Temp 97.5 F (36.4 C) (Oral)   Wt 164 lb 9.6 oz (74.7 kg)   SpO2 94%   BMI 28.25 kg/m  Gen: NAD, resting comfortably CV: RRR no murmurs rubs or gallops Lungs: CTAB no crackles, wheeze, rhonchi Abdomen: soft/nontender/nondistended/normal bowel sounds. No rebound or guarding.  Ext: no edema Skin: warm, dry Neuro: grossly normal, moves all extremities, apperas hard of hearing at times  Assessment/Plan:  Essential hypertension S: controlled now back off amlodipine. Compliant with Irbesartan, metoprolol, lasix.  BP Readings from Last 3 Encounters:  05/13/16 110/84  04/18/16 130/70  02/04/16 108/76  A/P:Continue current meds:  Continue to follow  Patient also scored 13 on phq 9 with AWV so scheduled follow up. HIS PHQ9 today is 9/30 but he scores 0 for first 2 questions and without anhedonia or depressed mood depression not present. He is slightly bored at  times but enjoys the life he has. We had discussion about memory and he states not as good as it use to be but no significant progression in recent years and wife confirms. He does not get lost driving- in fact shows her short cuts at times, it may just take him a short bit to remember something but he usually does. We discussed doing a repeat MMSE today to compare to Susan's score but patient and wife decline- they opt for 3 month follow up and repeat at that time  The duration of face-to-face time during this visit was greater than 15 minutes. Greater than 50%  of this time was spent in counseling about potential depression and memory loss  3 months  Return precautions advised.  Tana Conch, MD

## 2016-05-13 NOTE — Progress Notes (Signed)
Pre visit review using our clinic review tool, if applicable. No additional management support is needed unless otherwise documented below in the visit note. 

## 2016-05-13 NOTE — Patient Instructions (Signed)
Would not say you are depressed.   You have some memory loss but it is unclear if this is age related or potential dementia. We discussed repeating today but you preferred 3 month follow up- please schedule this before you leave

## 2016-05-14 NOTE — Assessment & Plan Note (Signed)
S: controlled now back off amlodipine. Compliant with Irbesartan, metoprolol, lasix.  BP Readings from Last 3 Encounters:  05/13/16 110/84  04/18/16 130/70  02/04/16 108/76  A/P:Continue current meds:  Continue to follow

## 2016-05-27 ENCOUNTER — Other Ambulatory Visit: Payer: Self-pay | Admitting: Family Medicine

## 2016-06-09 ENCOUNTER — Ambulatory Visit (INDEPENDENT_AMBULATORY_CARE_PROVIDER_SITE_OTHER): Payer: Medicare Other | Admitting: General Practice

## 2016-06-09 DIAGNOSIS — Z5181 Encounter for therapeutic drug level monitoring: Secondary | ICD-10-CM

## 2016-06-09 DIAGNOSIS — I4891 Unspecified atrial fibrillation: Secondary | ICD-10-CM

## 2016-06-09 LAB — POCT INR: INR: 2.7

## 2016-06-24 ENCOUNTER — Other Ambulatory Visit: Payer: Self-pay | Admitting: Family Medicine

## 2016-07-07 ENCOUNTER — Ambulatory Visit (INDEPENDENT_AMBULATORY_CARE_PROVIDER_SITE_OTHER): Payer: Medicare Other

## 2016-07-07 DIAGNOSIS — Z5181 Encounter for therapeutic drug level monitoring: Secondary | ICD-10-CM | POA: Diagnosis not present

## 2016-07-07 DIAGNOSIS — I4891 Unspecified atrial fibrillation: Secondary | ICD-10-CM

## 2016-07-07 LAB — POCT INR: INR: 2.7

## 2016-07-07 NOTE — Patient Instructions (Signed)
Pre visit review using our clinic review tool, if applicable. No additional management support is needed unless otherwise documented below in the visit note. 

## 2016-07-24 ENCOUNTER — Ambulatory Visit (INDEPENDENT_AMBULATORY_CARE_PROVIDER_SITE_OTHER): Payer: Medicare Other | Admitting: *Deleted

## 2016-07-24 DIAGNOSIS — Z95810 Presence of automatic (implantable) cardiac defibrillator: Secondary | ICD-10-CM | POA: Diagnosis not present

## 2016-07-24 DIAGNOSIS — I482 Chronic atrial fibrillation, unspecified: Secondary | ICD-10-CM

## 2016-07-24 NOTE — Progress Notes (Signed)
Remote ICD transmission.   

## 2016-07-28 ENCOUNTER — Other Ambulatory Visit: Payer: Self-pay | Admitting: Family Medicine

## 2016-07-28 ENCOUNTER — Other Ambulatory Visit: Payer: Self-pay | Admitting: Internal Medicine

## 2016-08-05 ENCOUNTER — Encounter: Payer: Self-pay | Admitting: Cardiology

## 2016-08-11 ENCOUNTER — Ambulatory Visit (INDEPENDENT_AMBULATORY_CARE_PROVIDER_SITE_OTHER): Payer: Medicare Other | Admitting: General Practice

## 2016-08-11 DIAGNOSIS — Z5181 Encounter for therapeutic drug level monitoring: Secondary | ICD-10-CM | POA: Diagnosis not present

## 2016-08-11 DIAGNOSIS — I4891 Unspecified atrial fibrillation: Secondary | ICD-10-CM

## 2016-08-11 LAB — POCT INR: INR: 2.7

## 2016-08-11 NOTE — Patient Instructions (Signed)
Pre visit review using our clinic review tool, if applicable. No additional management support is needed unless otherwise documented below in the visit note. 

## 2016-08-12 ENCOUNTER — Ambulatory Visit (INDEPENDENT_AMBULATORY_CARE_PROVIDER_SITE_OTHER): Payer: Medicare Other | Admitting: Family Medicine

## 2016-08-12 ENCOUNTER — Encounter: Payer: Self-pay | Admitting: Family Medicine

## 2016-08-12 DIAGNOSIS — E034 Atrophy of thyroid (acquired): Secondary | ICD-10-CM

## 2016-08-12 DIAGNOSIS — I10 Essential (primary) hypertension: Secondary | ICD-10-CM

## 2016-08-12 DIAGNOSIS — I5022 Chronic systolic (congestive) heart failure: Secondary | ICD-10-CM | POA: Diagnosis not present

## 2016-08-12 DIAGNOSIS — E119 Type 2 diabetes mellitus without complications: Secondary | ICD-10-CM | POA: Diagnosis not present

## 2016-08-12 DIAGNOSIS — R413 Other amnesia: Secondary | ICD-10-CM | POA: Insufficient documentation

## 2016-08-12 LAB — COMPREHENSIVE METABOLIC PANEL
ALBUMIN: 4.2 g/dL (ref 3.5–5.2)
ALT: 9 U/L (ref 0–53)
AST: 13 U/L (ref 0–37)
Alkaline Phosphatase: 83 U/L (ref 39–117)
BUN: 21 mg/dL (ref 6–23)
CHLORIDE: 101 meq/L (ref 96–112)
CO2: 27 meq/L (ref 19–32)
CREATININE: 1.07 mg/dL (ref 0.40–1.50)
Calcium: 9.7 mg/dL (ref 8.4–10.5)
GFR: 70.26 mL/min (ref >60.00)
Glucose, Bld: 147 mg/dL — ABNORMAL HIGH (ref 70–99)
POTASSIUM: 3.8 meq/L (ref 3.5–5.1)
SODIUM: 138 meq/L (ref 135–145)
Total Bilirubin: 0.7 mg/dL (ref 0.2–1.2)
Total Protein: 7.7 g/dL (ref 6.0–8.3)

## 2016-08-12 LAB — LDL CHOLESTEROL, DIRECT: LDL DIRECT: 64 mg/dL

## 2016-08-12 LAB — CBC
HEMATOCRIT: 42.5 % (ref 39.0–52.0)
Hemoglobin: 14.5 g/dL (ref 13.0–17.0)
MCHC: 34.1 g/dL (ref 30.0–36.0)
MCV: 89 fl (ref 78.0–100.0)
Platelets: 179 10*3/uL (ref 150.0–400.0)
RBC: 4.78 Mil/uL (ref 4.22–5.81)
RDW: 14.5 % (ref 11.5–15.5)
WBC: 7.8 10*3/uL (ref 4.0–10.5)

## 2016-08-12 LAB — HEMOGLOBIN A1C: Hgb A1c MFr Bld: 6.7 % — ABNORMAL HIGH (ref 4.6–6.5)

## 2016-08-12 LAB — TSH: TSH: 0.74 u[IU]/mL (ref 0.35–4.50)

## 2016-08-12 NOTE — Progress Notes (Signed)
Subjective:  [name] is a 82 y.o. year old very pleasant male patient who presents for/with See problem oriented charting ROS- wife states no worsening issues with memory- if anything seems better, does have fatigue which seems worse since amlodipine, no chest pain.    Past Medical History-  Patient Active Problem List   Diagnosis Date Noted  . CAD (coronary artery disease) 06/24/2014    Priority: High  . Diabetes mellitus type II, uncontrolled (HCC) 06/23/2014    Priority: High  . Chronic systolic heart failure (HCC) 09/25/2009    Priority: High  . Automatic implantable cardioverter-defibrillator in situ 09/25/2009    Priority: High  . Atrial fibrillation (HCC) 02/12/2007    Priority: High  . Balance problem 08/02/2014    Priority: Medium  . CKD (chronic kidney disease), stage II 12/07/2007    Priority: Medium  . Hypothyroidism 02/12/2007    Priority: Medium  . Hyperlipidemia 02/12/2007    Priority: Medium  . Essential hypertension 02/12/2007    Priority: Medium  . Encounter for therapeutic drug monitoring 10/06/2013    Priority: Low  . Back pain, chronic 08/10/2009    Priority: Low  . Localized osteoarthrosis, lower leg 04/13/2007    Priority: Low  . Memory loss 08/12/2016    Medications- reviewed and updated Current Outpatient Prescriptions  Medication Sig Dispense Refill  . amLODipine (NORVASC) 2.5 MG tablet Take 1 tablet (2.5 mg total) by mouth daily. 90 tablet 3  . aspirin 81 MG tablet Take 81 mg by mouth daily.      Marland Kitchen atorvastatin (LIPITOR) 20 MG tablet TAKE 1 TABLET ONCE DAILY. 30 tablet 5  . calcium carbonate (OS-CAL) 600 MG TABS Take 1 tablet (600 mg total) by mouth daily. 60 tablet   . furosemide (LASIX) 40 MG tablet Take 1 tablet (40 mg total) by mouth daily. 30 tablet 11  . irbesartan (AVAPRO) 300 MG tablet TAKE 1 TABLET ONCE DAILY. 90 tablet 3  . levothyroxine (SYNTHROID, LEVOTHROID) 100 MCG tablet TAKE 1 TABLET ONCE DAILY BEFORE BREAKFAST. 90 tablet  1  . metFORMIN (GLUCOPHAGE) 500 MG tablet TAKE 1 TABLET EACH DAY. 90 tablet 3  . metoprolol (LOPRESSOR) 50 MG tablet TAKE 1&1/2 TABLETS TWICE DAILY. 270 tablet 0  . potassium chloride SA (K-DUR,KLOR-CON) 20 MEQ tablet TAKE 1 TABLET ONCE DAILY. 90 tablet 0  . warfarin (COUMADIN) 2 MG tablet TAKE AS DIRECTED. 105 tablet 2   No current facility-administered medications for this visit.     Objective: BP 118/70   Pulse 70   Temp 97.3 F (36.3 C) (Oral)   Ht 5\' 4"  (1.626 m)   Wt 162 lb 9.6 oz (73.8 kg)   SpO2 94%   BMI 27.91 kg/m  Gen: NAD, resting comfortably, appears stated age CV: RRR no murmurs. Appears in sinus.  Lungs: CTAB no crackles, wheeze, rhonchi Abdomen: soft/nontender/nondistended/normal bowel sounds.   Ext: no edema Skin: warm, dry Neuro: walks with cane  Diabetic Foot Exam - Simple   Simple Foot Form Diabetic Foot exam was performed with the following findings:  Yes 08/12/2016  3:12 PM  Visual Inspection No deformities, no ulcerations, no other skin breakdown bilaterally:  Yes Sensation Testing Intact to touch and monofilament testing bilaterally:  Yes Pulse Check Posterior Tibialis and Dorsalis pulse intact bilaterally:  Yes Comments Dry skin     Assessment/Plan:  Essential hypertension S: controlled on irbesartan, metoprolol, lasix. I thought he was not taking amlodipine 2.5mg  but in fact he has been BP  Readings from Last 3 Encounters:  08/12/16 118/70  05/13/16 110/84  04/18/16 130/70  A/P:Continue current meds:  We will discontinue the amlodipine- reported increased fatigue when starting medicine. Follow up 3 months. Makes sense to come off as long as we can keep SBP under 140.   Diabetes mellitus type II, uncontrolled S: well controlled. On metformin 500mg  daily alone Lab Results  Component Value Date   HGBA1C 6.8 (H) 02/04/2016   HGBA1C 6.8 (H) 08/06/2015   HGBA1C 6.8 (H) 02/02/2015   A/P: update a1c today as well as direct ldl, cmp,  cbc   Chronic systolic heart failure S: doing well on Lasix 40mg , irbesartan, metoprolol A/P: continue current medication- doing well (no edema, no weight gain, increased SOB)   Hypothyroidism S: controlled on levothyroxine 100mcg A/P: update tsh today   Memory loss MMSE 24/30 with Darl PikesSusan 04/18/16. 25/30 with me 08/12/16. Monitor yearly unless worsening symptoms. Suspect mild cognitive impairment- if worsens start reversible cause of dementia workup   Return in about 14 weeks (around 11/18/2016).  Orders Placed This Encounter  Procedures  . CBC    [place]  . Comprehensive metabolic panel    [place]  . LDL cholesterol, direct    [place]  . Hemoglobin A1c    [place]  . TSH        No orders of the defined types were placed in this encounter.   Return precautions advised.  Tana ConchStephen Tvisha Schwoerer, MD

## 2016-08-12 NOTE — Patient Instructions (Addendum)
Stop amlodipine completely. Continue irbesartan, metoprolol. Lasix for blood pressure  Labs before you go  See me in about 14 weeks, repeat memory test yearly at this point

## 2016-08-12 NOTE — Progress Notes (Signed)
Pre visit review using our clinic review tool, if applicable. No additional management support is needed unless otherwise documented below in the visit note. 

## 2016-08-12 NOTE — Assessment & Plan Note (Signed)
S: well controlled. On metformin 500mg  daily alone Lab Results  Component Value Date   HGBA1C 6.8 (H) 02/04/2016   HGBA1C 6.8 (H) 08/06/2015   HGBA1C 6.8 (H) 02/02/2015   A/P: update a1c today as well as direct ldl, cmp, cbc

## 2016-08-12 NOTE — Assessment & Plan Note (Signed)
MMSE 24/30 with Darl Pikes 04/18/16. 25/30 with me 08/12/16. Monitor yearly unless worsening symptoms. Suspect mild cognitive impairment- if worsens start reversible cause of dementia workup

## 2016-08-12 NOTE — Assessment & Plan Note (Signed)
S: doing well on Lasix 40mg , irbesartan, metoprolol A/P: continue current medication- doing well (no edema, no weight gain, increased SOB)

## 2016-08-12 NOTE — Assessment & Plan Note (Signed)
S: controlled on irbesartan, metoprolol, lasix. I thought he was not taking amlodipine 2.5mg  but in fact he has been BP Readings from Last 3 Encounters:  08/12/16 118/70  05/13/16 110/84  04/18/16 130/70  A/P:Continue current meds:  We will discontinue the amlodipine- reported increased fatigue when starting medicine. Follow up 3 months. Makes sense to come off as long as we can keep SBP under 140.

## 2016-08-12 NOTE — Assessment & Plan Note (Signed)
S: controlled on levothyroxine A/P: update tsh today

## 2016-09-03 LAB — CUP PACEART REMOTE DEVICE CHECK
Brady Statistic RA Percent Paced: 0 %
HighPow Impedance: 57 Ohm
Implantable Lead Implant Date: 20060718
Implantable Lead Implant Date: 20110902
Implantable Lead Location: 753858
Implantable Lead Location: 753859
Implantable Lead Model: 158
Implantable Lead Model: 4087
Implantable Lead Model: 4196
Implantable Lead Serial Number: 250893
Lead Channel Pacing Threshold Amplitude: 0.6 V
Lead Channel Pacing Threshold Amplitude: 0.9 V
Lead Channel Pacing Threshold Pulse Width: 0.4 ms
Lead Channel Setting Pacing Amplitude: 2 V
Lead Channel Setting Pacing Pulse Width: 0.4 ms
Lead Channel Setting Pacing Pulse Width: 0.4 ms
Lead Channel Setting Sensing Sensitivity: 0.5 mV
MDC IDC LEAD IMPLANT DT: 20060718
MDC IDC LEAD LOCATION: 753860
MDC IDC LEAD SERIAL: 164746
MDC IDC MSMT BATTERY REMAINING LONGEVITY: 30 mo
MDC IDC MSMT BATTERY REMAINING PERCENTAGE: 51 %
MDC IDC MSMT LEADCHNL LV IMPEDANCE VALUE: 475 Ohm
MDC IDC MSMT LEADCHNL RA IMPEDANCE VALUE: 526 Ohm
MDC IDC MSMT LEADCHNL RV IMPEDANCE VALUE: 491 Ohm
MDC IDC MSMT LEADCHNL RV PACING THRESHOLD PULSEWIDTH: 0.4 ms
MDC IDC PG IMPLANT DT: 20110902
MDC IDC PG SERIAL: 480118
MDC IDC SESS DTM: 20171130143700
MDC IDC SET LEADCHNL LV SENSING SENSITIVITY: 1 mV
MDC IDC SET LEADCHNL RV PACING AMPLITUDE: 2.5 V
MDC IDC STAT BRADY RV PERCENT PACED: 94 %

## 2016-09-08 ENCOUNTER — Ambulatory Visit (INDEPENDENT_AMBULATORY_CARE_PROVIDER_SITE_OTHER): Payer: Medicare Other | Admitting: General Practice

## 2016-09-08 DIAGNOSIS — Z5181 Encounter for therapeutic drug level monitoring: Secondary | ICD-10-CM | POA: Diagnosis not present

## 2016-09-08 DIAGNOSIS — I4891 Unspecified atrial fibrillation: Secondary | ICD-10-CM

## 2016-09-08 LAB — POCT INR: INR: 2.3

## 2016-09-08 NOTE — Patient Instructions (Signed)
Pre visit review using our clinic review tool, if applicable. No additional management support is needed unless otherwise documented below in the visit note. 

## 2016-10-06 ENCOUNTER — Ambulatory Visit (INDEPENDENT_AMBULATORY_CARE_PROVIDER_SITE_OTHER): Payer: Medicare Other | Admitting: General Practice

## 2016-10-06 DIAGNOSIS — Z5181 Encounter for therapeutic drug level monitoring: Secondary | ICD-10-CM

## 2016-10-06 LAB — POCT INR: INR: 1.4

## 2016-10-06 NOTE — Patient Instructions (Signed)
Pre visit review using our clinic review tool, if applicable. No additional management support is needed unless otherwise documented below in the visit note. 

## 2016-10-24 ENCOUNTER — Other Ambulatory Visit: Payer: Self-pay | Admitting: Internal Medicine

## 2016-10-24 ENCOUNTER — Encounter: Payer: Self-pay | Admitting: Internal Medicine

## 2016-10-24 ENCOUNTER — Ambulatory Visit (INDEPENDENT_AMBULATORY_CARE_PROVIDER_SITE_OTHER): Payer: Medicare Other | Admitting: Internal Medicine

## 2016-10-24 ENCOUNTER — Other Ambulatory Visit: Payer: Self-pay | Admitting: Family Medicine

## 2016-10-24 VITALS — BP 108/76 | HR 70 | Ht 65.0 in | Wt 161.6 lb

## 2016-10-24 DIAGNOSIS — I5022 Chronic systolic (congestive) heart failure: Secondary | ICD-10-CM | POA: Diagnosis not present

## 2016-10-24 DIAGNOSIS — Z95810 Presence of automatic (implantable) cardiac defibrillator: Secondary | ICD-10-CM

## 2016-10-24 LAB — CUP PACEART INCLINIC DEVICE CHECK
Brady Statistic RV Percent Paced: 94 %
Date Time Interrogation Session: 20180302050000
HIGH POWER IMPEDANCE MEASURED VALUE: 36 Ohm
HighPow Impedance: 59 Ohm
Implantable Lead Implant Date: 20060718
Implantable Lead Implant Date: 20060718
Implantable Lead Location: 753859
Implantable Lead Serial Number: 164746
Implantable Lead Serial Number: 250893
Implantable Pulse Generator Implant Date: 20110902
Lead Channel Impedance Value: 548 Ohm
Lead Channel Pacing Threshold Amplitude: 0.7 V
Lead Channel Pacing Threshold Amplitude: 0.9 V
Lead Channel Sensing Intrinsic Amplitude: 0.5 mV
Lead Channel Setting Pacing Amplitude: 2.5 V
Lead Channel Setting Pacing Pulse Width: 0.4 ms
Lead Channel Setting Sensing Sensitivity: 1 mV
MDC IDC LEAD IMPLANT DT: 20110902
MDC IDC LEAD LOCATION: 753858
MDC IDC LEAD LOCATION: 753860
MDC IDC MSMT LEADCHNL LV PACING THRESHOLD PULSEWIDTH: 0.4 ms
MDC IDC MSMT LEADCHNL RV PACING THRESHOLD PULSEWIDTH: 0.4 ms
MDC IDC PG SERIAL: 480118
MDC IDC SET LEADCHNL LV PACING AMPLITUDE: 2 V
MDC IDC SET LEADCHNL RV PACING PULSEWIDTH: 0.4 ms
MDC IDC SET LEADCHNL RV SENSING SENSITIVITY: 0.5 mV

## 2016-10-24 NOTE — Progress Notes (Signed)
HPI [name] returns today for followup. He is a very pleasant 83 year old man with a nonischemic cardiomyopathy, chronic systolic heart failure, uncontrolled atrial fibrillation, status post AV node ablation, and insertion of a biventricular ICD. In the interim, he has had no chest pain or sob. No syncope. He has class II heart failure symptoms. He has fallen a couple of times but he has not been admitted.   Allergies  Allergen Reactions  . Amoxicillin     REACTION: unspecified UNKNOWN  . Ezetimibe-Simvastatin     REACTION: unspecified UNKNOWN  . Penicillins Nausea Only  . Sulfamethoxazole-Trimethoprim     REACTION: nausea     Current Outpatient Prescriptions  Medication Sig Dispense Refill  . aspirin 81 MG tablet Take 81 mg by mouth daily.      Marland Kitchen atorvastatin (LIPITOR) 20 MG tablet TAKE 1 TABLET ONCE DAILY. 30 tablet 5  . calcium carbonate (OS-CAL) 600 MG TABS Take 1 tablet (600 mg total) by mouth daily. 60 tablet   . furosemide (LASIX) 40 MG tablet Take 1 tablet (40 mg total) by mouth daily. 30 tablet 11  . irbesartan (AVAPRO) 300 MG tablet TAKE 1 TABLET ONCE DAILY. 90 tablet 3  . levothyroxine (SYNTHROID, LEVOTHROID) 100 MCG tablet TAKE 1 TABLET ONCE DAILY BEFORE BREAKFAST. 90 tablet 1  . metFORMIN (GLUCOPHAGE) 500 MG tablet TAKE 1 TABLET EACH DAY. 90 tablet 3  . metoprolol (LOPRESSOR) 50 MG tablet TAKE 1&1/2 TABLETS TWICE DAILY. 270 tablet 0  . potassium chloride SA (K-DUR,KLOR-CON) 20 MEQ tablet TAKE 1 TABLET ONCE DAILY. 90 tablet 0  . warfarin (COUMADIN) 2 MG tablet TAKE AS DIRECTED. 105 tablet 2   No current facility-administered medications for this visit.      Past Medical History:  Diagnosis Date  . Aortic valve disease   . Cardiomyopathy, nonischemic (HCC)   . Cellulitis and abscess of upper arm and forearm 12/11/2008   Qualifier: Diagnosis of  By: Bryan Sheehan MD, [name]   . CHF (congestive heart failure) (HCC)   . Chronic renal insufficiency   . Defibrillator  activation   . DIVERTICULOSIS OF COLON 03/31/2007   Qualifier: Diagnosis of  By: [name] CMA (AAMA), [name]    . Hyperlipidemia   . Hypertension   . LBBB (left bundle branch block)   . Long term (current) use of anticoagulants   . S/P ablation of atrial fibrillation   . S/P AVR (aortic valve replacement) and aortoplasty   . Tachycardia induced cardiomyopathy (HCC)   . Thyroid disease     ROS:   All systems reviewed and negative except as noted in the HPI.   Past Surgical History:  Procedure Laterality Date  . AORTIC VALVE SURGERY  1992   St. Jude Valve  . CARDIAC CATHETERIZATION  12/04/04  . CORONARY ARTERY BYPASS GRAFT    . pacmaker,defibr  2006     Family History  Problem Relation Age of Onset  . Heart disease Mother   . Lung disease Mother   . Heart attack Father   . Stroke Brother   . Hypertension Brother      Social History   Social History  . Marital status: Married    Spouse name: N/A  . Number of children: N/A  . Years of education: N/A   Occupational History  . Not on file.   Social History Main Topics  . Smoking status: Former Smoker    Quit date: 08/25/1968  . Smokeless tobacco: Never Used  Comment: "burned up more than he smoked"   . Alcohol use 3.5 oz/week    7 Standard drinks or equivalent per week     Comment: occasional beer glass of wine   . Drug use: No  . Sexual activity: Yes   Other Topics Concern  . Not on file   Social History Narrative   Married 2001. 2 kids. No grandkids.       Retired from [employer]      Hobbies: tv     Ht 5\' 5"  (1.651 m)   Wt 161 lb 9.6 oz (73.3 kg)   BMI 26.89 kg/m   Physical Exam:  Chronically ill appearing 84 year old man,NAD HEENT: Unremarkable Neck:  7 cm JVD, no thyromegally Lungs:  Clear with no wheezes, rales, or rhonchi. HEART:  Regular rate rhythm, no murmurs, no rubs, no clicks Abd:  soft, positive bowel sounds, no organomegally, no rebound, no guarding Ext:  2 plus  pulses, no edema, no cyanosis, no clubbing Skin:  No rashes no nodules Neuro:  CN II through XII intact, motor grossly intact  ECG - atrial fib with BiVentricular pacing   DEVICE  Normal device function.  See PaceArt for details.   Assess/Plan: 1. Chronic systolic heart failure - he remains class 2. He has become more sedentary and I have asked him to avoid this. 2. Atrial fib - his ventricular rate is well controlled. No change in meds. 3. ICD - his device is working normally. Will recheck in several months. 4. Falls - I am concerned about his falls while on coumadin but he has a mechanical Aortic valve. I have asked him to be more careful.  [name].D.

## 2016-10-24 NOTE — Patient Instructions (Addendum)
Medication Instructions:  Your physician recommends that you continue on your current medications as directed. Please refer to the Current Medication list given to you today.   Labwork: None Ordered   Testing/Procedures: None Ordered   Follow-Up: Your physician wants you to follow-up in: 1 year with Dr. Taylor. You will receive a reminder letter in the mail two months in advance. If you don't receive a letter, please call our office to schedule the follow-up appointment.  Remote monitoring is used to monitor your ICD from home. This monitoring reduces the number of office visits required to check your device to one time per year. It allows us to keep an eye on the functioning of your device to ensure it is working properly. You are scheduled for a device check from home on 01/26/17. You may send your transmission at any time that day. If you have a wireless device, the transmission will be sent automatically. After your physician reviews your transmission, you will receive a postcard with your next transmission date.    Any Other Special Instructions Will Be Listed Below (If Applicable).     If you need a refill on your cardiac medications before your next appointment, please call your pharmacy.                  

## 2016-10-27 ENCOUNTER — Ambulatory Visit (INDEPENDENT_AMBULATORY_CARE_PROVIDER_SITE_OTHER): Payer: Medicare Other | Admitting: General Practice

## 2016-10-27 DIAGNOSIS — I4891 Unspecified atrial fibrillation: Secondary | ICD-10-CM

## 2016-10-27 DIAGNOSIS — Z5181 Encounter for therapeutic drug level monitoring: Secondary | ICD-10-CM

## 2016-10-27 LAB — POCT INR: INR: 2.2

## 2016-10-27 NOTE — Patient Instructions (Signed)
Pre visit review using our clinic review tool, if applicable. No additional management support is needed unless otherwise documented below in the visit note. 

## 2016-10-27 NOTE — Progress Notes (Signed)
I have reviewed and agree with note, evaluation, plan.   Gayla Benn, MD  

## 2016-11-06 ENCOUNTER — Other Ambulatory Visit: Payer: Self-pay

## 2016-11-06 MED ORDER — WARFARIN SODIUM 2 MG PO TABS: ORAL_TABLET | ORAL | 2 refills | Status: DC

## 2016-11-24 ENCOUNTER — Ambulatory Visit (INDEPENDENT_AMBULATORY_CARE_PROVIDER_SITE_OTHER): Payer: Medicare Other | Admitting: General Practice

## 2016-11-24 DIAGNOSIS — Z5181 Encounter for therapeutic drug level monitoring: Secondary | ICD-10-CM | POA: Diagnosis not present

## 2016-11-24 DIAGNOSIS — I4891 Unspecified atrial fibrillation: Secondary | ICD-10-CM

## 2016-11-24 LAB — POCT INR: INR: 1.9

## 2016-11-24 NOTE — Patient Instructions (Signed)
Pre visit review using our clinic review tool, if applicable. No additional management support is needed unless otherwise documented below in the visit note. 

## 2016-11-24 NOTE — Progress Notes (Signed)
I have reviewed and agree with note, evaluation, plan.   Zaydenn Balaguer, MD  

## 2016-12-18 ENCOUNTER — Telehealth: Payer: Self-pay | Admitting: Family Medicine

## 2016-12-18 NOTE — Telephone Encounter (Signed)
Not on DPR. Cannot communicate with patient.

## 2016-12-18 NOTE — Telephone Encounter (Signed)
I called and informed [name] that he is not on the DPR so I can not provide him with information regarding his father or his care. He states he is moving and it is cutting off contact with the father. He states he is trying to get Mr. Coddington placed in [employer]. He states his sister is in charge of his father. I don't see where she is on the [facility] either.

## 2016-12-18 NOTE — Telephone Encounter (Signed)
Son Onalee Hua states he needs to speak with Dr Durene Cal concerning placing pt into an assisted living facility.  Onalee Hua states he called 2 days ago and spoke with an after house nurse that advised she would have the dr call. Onalee Hua states he would like a call back asap.  He lives out of town and they need to get pt placed

## 2016-12-22 ENCOUNTER — Ambulatory Visit (INDEPENDENT_AMBULATORY_CARE_PROVIDER_SITE_OTHER): Payer: Medicare Other | Admitting: General Practice

## 2016-12-22 DIAGNOSIS — I4891 Unspecified atrial fibrillation: Secondary | ICD-10-CM

## 2016-12-22 DIAGNOSIS — Z5181 Encounter for therapeutic drug level monitoring: Secondary | ICD-10-CM | POA: Diagnosis not present

## 2016-12-22 LAB — POCT INR: INR: 2.2

## 2016-12-22 NOTE — Patient Instructions (Signed)
Pre visit review using our clinic review tool, if applicable. No additional management support is needed unless otherwise documented below in the visit note. 

## 2016-12-22 NOTE — Progress Notes (Signed)
I have reviewed and agree with note, evaluation, plan.   Stephen Hunter, MD  

## 2016-12-24 NOTE — Telephone Encounter (Signed)
Just an FYI

## 2016-12-26 DIAGNOSIS — E113291 Type 2 diabetes mellitus with mild nonproliferative diabetic retinopathy without macular edema, right eye: Secondary | ICD-10-CM | POA: Diagnosis not present

## 2016-12-26 DIAGNOSIS — H2512 Age-related nuclear cataract, left eye: Secondary | ICD-10-CM | POA: Diagnosis not present

## 2016-12-26 DIAGNOSIS — H25012 Cortical age-related cataract, left eye: Secondary | ICD-10-CM | POA: Diagnosis not present

## 2016-12-26 DIAGNOSIS — H52203 Unspecified astigmatism, bilateral: Secondary | ICD-10-CM | POA: Diagnosis not present

## 2016-12-26 LAB — HM DIABETES EYE EXAM

## 2016-12-30 ENCOUNTER — Emergency Department (HOSPITAL_COMMUNITY): Payer: Medicare Other

## 2016-12-30 ENCOUNTER — Telehealth: Payer: Self-pay

## 2016-12-30 ENCOUNTER — Inpatient Hospital Stay (HOSPITAL_COMMUNITY)
Admission: EM | Admit: 2016-12-30 | Discharge: 2017-01-01 | DRG: 086 | Disposition: A | Discharge status: Skilled Nursing Facility | Payer: Medicare Other | Attending: Internal Medicine | Admitting: Internal Medicine

## 2016-12-30 ENCOUNTER — Encounter (HOSPITAL_COMMUNITY): Payer: Self-pay

## 2016-12-30 DIAGNOSIS — Z95810 Presence of automatic (implantable) cardiac defibrillator: Secondary | ICD-10-CM | POA: Diagnosis not present

## 2016-12-30 DIAGNOSIS — E785 Hyperlipidemia, unspecified: Secondary | ICD-10-CM | POA: Diagnosis not present

## 2016-12-30 DIAGNOSIS — M25551 Pain in right hip: Secondary | ICD-10-CM | POA: Diagnosis not present

## 2016-12-30 DIAGNOSIS — I4891 Unspecified atrial fibrillation: Secondary | ICD-10-CM | POA: Diagnosis present

## 2016-12-30 DIAGNOSIS — S3993XA Unspecified injury of pelvis, initial encounter: Secondary | ICD-10-CM | POA: Diagnosis not present

## 2016-12-30 DIAGNOSIS — Z7984 Long term (current) use of oral hypoglycemic drugs: Secondary | ICD-10-CM

## 2016-12-30 DIAGNOSIS — W010XXA Fall on same level from slipping, tripping and stumbling without subsequent striking against object, initial encounter: Secondary | ICD-10-CM | POA: Diagnosis present

## 2016-12-30 DIAGNOSIS — Z952 Presence of prosthetic heart valve: Secondary | ICD-10-CM | POA: Diagnosis not present

## 2016-12-30 DIAGNOSIS — M549 Dorsalgia, unspecified: Secondary | ICD-10-CM | POA: Diagnosis not present

## 2016-12-30 DIAGNOSIS — R413 Other amnesia: Secondary | ICD-10-CM | POA: Diagnosis not present

## 2016-12-30 DIAGNOSIS — Z87891 Personal history of nicotine dependence: Secondary | ICD-10-CM | POA: Diagnosis not present

## 2016-12-30 DIAGNOSIS — E039 Hypothyroidism, unspecified: Secondary | ICD-10-CM | POA: Diagnosis present

## 2016-12-30 DIAGNOSIS — M25561 Pain in right knee: Secondary | ICD-10-CM | POA: Diagnosis not present

## 2016-12-30 DIAGNOSIS — IMO0002 Reserved for concepts with insufficient information to code with codable children: Secondary | ICD-10-CM | POA: Diagnosis present

## 2016-12-30 DIAGNOSIS — R531 Weakness: Secondary | ICD-10-CM

## 2016-12-30 DIAGNOSIS — Z951 Presence of aortocoronary bypass graft: Secondary | ICD-10-CM

## 2016-12-30 DIAGNOSIS — S0003XA Contusion of scalp, initial encounter: Secondary | ICD-10-CM

## 2016-12-30 DIAGNOSIS — M6281 Muscle weakness (generalized): Secondary | ICD-10-CM | POA: Diagnosis not present

## 2016-12-30 DIAGNOSIS — N182 Chronic kidney disease, stage 2 (mild): Secondary | ICD-10-CM | POA: Diagnosis present

## 2016-12-30 DIAGNOSIS — E1165 Type 2 diabetes mellitus with hyperglycemia: Secondary | ICD-10-CM | POA: Diagnosis present

## 2016-12-30 DIAGNOSIS — Z880 Allergy status to penicillin: Secondary | ICD-10-CM

## 2016-12-30 DIAGNOSIS — Z881 Allergy status to other antibiotic agents status: Secondary | ICD-10-CM

## 2016-12-30 DIAGNOSIS — I130 Hypertensive heart and chronic kidney disease with heart failure and stage 1 through stage 4 chronic kidney disease, or unspecified chronic kidney disease: Secondary | ICD-10-CM | POA: Diagnosis not present

## 2016-12-30 DIAGNOSIS — J449 Chronic obstructive pulmonary disease, unspecified: Secondary | ICD-10-CM | POA: Diagnosis present

## 2016-12-30 DIAGNOSIS — W19XXXA Unspecified fall, initial encounter: Secondary | ICD-10-CM

## 2016-12-30 DIAGNOSIS — G8929 Other chronic pain: Secondary | ICD-10-CM | POA: Diagnosis not present

## 2016-12-30 DIAGNOSIS — I5022 Chronic systolic (congestive) heart failure: Secondary | ICD-10-CM | POA: Diagnosis present

## 2016-12-30 DIAGNOSIS — T45515A Adverse effect of anticoagulants, initial encounter: Secondary | ICD-10-CM | POA: Diagnosis not present

## 2016-12-30 DIAGNOSIS — R2681 Unsteadiness on feet: Secondary | ICD-10-CM | POA: Diagnosis present

## 2016-12-30 DIAGNOSIS — I609 Nontraumatic subarachnoid hemorrhage, unspecified: Secondary | ICD-10-CM

## 2016-12-30 DIAGNOSIS — I2510 Atherosclerotic heart disease of native coronary artery without angina pectoris: Secondary | ICD-10-CM | POA: Diagnosis present

## 2016-12-30 DIAGNOSIS — Z888 Allergy status to other drugs, medicaments and biological substances status: Secondary | ICD-10-CM

## 2016-12-30 DIAGNOSIS — E118 Type 2 diabetes mellitus with unspecified complications: Secondary | ICD-10-CM | POA: Diagnosis not present

## 2016-12-30 DIAGNOSIS — Z882 Allergy status to sulfonamides status: Secondary | ICD-10-CM

## 2016-12-30 DIAGNOSIS — T148XXA Other injury of unspecified body region, initial encounter: Secondary | ICD-10-CM

## 2016-12-30 DIAGNOSIS — S066X0A Traumatic subarachnoid hemorrhage without loss of consciousness, initial encounter: Secondary | ICD-10-CM | POA: Diagnosis not present

## 2016-12-30 DIAGNOSIS — Z7982 Long term (current) use of aspirin: Secondary | ICD-10-CM

## 2016-12-30 DIAGNOSIS — E1122 Type 2 diabetes mellitus with diabetic chronic kidney disease: Secondary | ICD-10-CM | POA: Diagnosis present

## 2016-12-30 DIAGNOSIS — S7011XA Contusion of right thigh, initial encounter: Secondary | ICD-10-CM | POA: Diagnosis not present

## 2016-12-30 DIAGNOSIS — Z7901 Long term (current) use of anticoagulants: Secondary | ICD-10-CM

## 2016-12-30 DIAGNOSIS — M79651 Pain in right thigh: Secondary | ICD-10-CM | POA: Diagnosis not present

## 2016-12-30 DIAGNOSIS — S51011A Laceration without foreign body of right elbow, initial encounter: Secondary | ICD-10-CM | POA: Diagnosis not present

## 2016-12-30 DIAGNOSIS — M5440 Lumbago with sciatica, unspecified side: Secondary | ICD-10-CM | POA: Diagnosis not present

## 2016-12-30 DIAGNOSIS — S79921A Unspecified injury of right thigh, initial encounter: Secondary | ICD-10-CM | POA: Diagnosis not present

## 2016-12-30 LAB — BASIC METABOLIC PANEL WITH GFR
Anion gap: 7 (ref 5–15)
BUN: 28 mg/dL — ABNORMAL HIGH (ref 6–20)
CO2: 27 mmol/L (ref 22–32)
Calcium: 9.3 mg/dL (ref 8.9–10.3)
Chloride: 102 mmol/L (ref 101–111)
Creatinine, Ser: 1.09 mg/dL (ref 0.61–1.24)
GFR calc Af Amer: >60 mL/min
GFR calc non Af Amer: >60 mL/min
Glucose, Bld: 154 mg/dL — ABNORMAL HIGH (ref 65–99)
Potassium: 4.6 mmol/L (ref 3.5–5.1)
Sodium: 136 mmol/L (ref 135–145)

## 2016-12-30 LAB — PROTIME-INR
INR: 2.66
INR: 1.27
INR: 1.31
PROTHROMBIN TIME: 15.9 s — AB (ref 11.4–15.2)
PROTHROMBIN TIME: 16.3 s — AB (ref 11.4–15.2)
Prothrombin Time: 28.9 s — ABNORMAL HIGH (ref 11.4–15.2)

## 2016-12-30 LAB — CBC
HCT: 34.2 % — ABNORMAL LOW (ref 39.0–52.0)
HEMOGLOBIN: 11.3 g/dL — AB (ref 13.0–17.0)
MCH: 29.2 pg (ref 26.0–34.0)
MCHC: 33 g/dL (ref 30.0–36.0)
MCV: 88.4 fL (ref 78.0–100.0)
Platelets: 157 10*3/uL (ref 150–400)
RBC: 3.87 MIL/uL — AB (ref 4.22–5.81)
RDW: 14.7 % (ref 11.5–15.5)
WBC: 11.2 10*3/uL — AB (ref 4.0–10.5)

## 2016-12-30 LAB — GLUCOSE, CAPILLARY
Glucose-Capillary: 149 mg/dL — ABNORMAL HIGH (ref 65–99)
Glucose-Capillary: 145 mg/dL — ABNORMAL HIGH (ref 65–99)

## 2016-12-30 LAB — TSH: TSH: 1.105 u[IU]/mL (ref 0.350–4.500)

## 2016-12-30 MED ORDER — SODIUM CHLORIDE 0.9% FLUSH
3.0000 mL | Freq: Two times a day (BID) | INTRAVENOUS | Status: DC
  Administered 2016-12-31: 3 mL via INTRAVENOUS

## 2016-12-30 MED ORDER — CALCIUM CARBONATE 600 MG PO TABS: 600.0000 mg | ORAL_TABLET | Freq: Every day | ORAL | Status: DC

## 2016-12-30 MED ORDER — ONDANSETRON HCL 4 MG/2ML IJ SOLN: 4.0000 mg | Freq: Four times a day (QID) | INTRAMUSCULAR | Status: DC | PRN

## 2016-12-30 MED ORDER — LEVOTHYROXINE SODIUM 100 MCG PO TABS
100.0000 ug | ORAL_TABLET | Freq: Every day | ORAL | Status: DC
  Administered 2016-12-31 – 2017-01-01 (×2): 100 ug via ORAL
  Filled 2016-12-30 (×2): qty 1

## 2016-12-30 MED ORDER — INSULIN ASPART 100 UNIT/ML ~~LOC~~ SOLN
0.0000 [IU] | Freq: Three times a day (TID) | SUBCUTANEOUS | Status: DC
  Administered 2016-12-30 – 2016-12-31 (×3): 2 [IU] via SUBCUTANEOUS
  Administered 2016-12-31: 3 [IU] via SUBCUTANEOUS
  Administered 2017-01-01: 2 [IU] via SUBCUTANEOUS

## 2016-12-30 MED ORDER — PROTHROMBIN COMPLEX CONC HUMAN 500 UNITS IV KIT
1521.0000 [IU] | PACK | Status: AC
  Administered 2016-12-30: 1521 [IU] via INTRAVENOUS
  Filled 2016-12-30: qty 61

## 2016-12-30 MED ORDER — ATORVASTATIN CALCIUM 20 MG PO TABS
20.0000 mg | ORAL_TABLET | Freq: Every day | ORAL | Status: DC
  Administered 2016-12-30 – 2016-12-31 (×2): 20 mg via ORAL
  Filled 2016-12-30 (×2): qty 2
  Filled 2016-12-30 (×3): qty 1

## 2016-12-30 MED ORDER — SODIUM CHLORIDE 0.9 % IV SOLN
INTRAVENOUS | Status: AC
  Administered 2016-12-30: 18:00:00 via INTRAVENOUS

## 2016-12-30 MED ORDER — POTASSIUM CHLORIDE CRYS ER 20 MEQ PO TBCR
20.0000 meq | EXTENDED_RELEASE_TABLET | Freq: Every day | ORAL | Status: DC
  Administered 2016-12-31 – 2017-01-01 (×2): 20 meq via ORAL
  Filled 2016-12-30 (×2): qty 1

## 2016-12-30 MED ORDER — LEVETIRACETAM 500 MG PO TABS
500.0000 mg | ORAL_TABLET | Freq: Two times a day (BID) | ORAL | Status: DC
  Administered 2016-12-30 – 2017-01-01 (×5): 500 mg via ORAL
  Filled 2016-12-30 (×5): qty 1

## 2016-12-30 MED ORDER — ACETAMINOPHEN 325 MG PO TABS: 650.0000 mg | ORAL_TABLET | Freq: Four times a day (QID) | ORAL | Status: DC | PRN

## 2016-12-30 MED ORDER — INSULIN ASPART 100 UNIT/ML ~~LOC~~ SOLN: 0.0000 [IU] | Freq: Every day | SUBCUTANEOUS | Status: DC

## 2016-12-30 MED ORDER — CALCIUM CARBONATE-VITAMIN D 500-200 MG-UNIT PO TABS
1.0000 | ORAL_TABLET | Freq: Every day | ORAL | Status: DC
  Administered 2016-12-31 – 2017-01-01 (×2): 1 via ORAL
  Filled 2016-12-30 (×2): qty 1

## 2016-12-30 MED ORDER — FUROSEMIDE 20 MG PO TABS: 40.0000 mg | ORAL_TABLET | Freq: Every day | ORAL | Status: DC

## 2016-12-30 MED ORDER — METOPROLOL TARTRATE 50 MG PO TABS
75.0000 mg | ORAL_TABLET | Freq: Two times a day (BID) | ORAL | Status: DC
  Administered 2016-12-31 – 2017-01-01 (×3): 75 mg via ORAL
  Filled 2016-12-30 (×4): qty 1

## 2016-12-30 MED ORDER — FUROSEMIDE 40 MG PO TABS
40.0000 mg | ORAL_TABLET | Freq: Every day | ORAL | Status: DC
  Administered 2016-12-31 – 2017-01-01 (×2): 40 mg via ORAL
  Filled 2016-12-30 (×2): qty 1

## 2016-12-30 MED ORDER — ACETAMINOPHEN 650 MG RE SUPP: 650.0000 mg | Freq: Four times a day (QID) | RECTAL | Status: DC | PRN

## 2016-12-30 MED ORDER — ONDANSETRON HCL 4 MG PO TABS: 4.0000 mg | ORAL_TABLET | Freq: Four times a day (QID) | ORAL | Status: DC | PRN

## 2016-12-30 MED ORDER — IRBESARTAN 300 MG PO TABS
300.0000 mg | ORAL_TABLET | Freq: Every day | ORAL | Status: DC
  Administered 2016-12-30 – 2017-01-01 (×2): 300 mg via ORAL
  Filled 2016-12-30 (×3): qty 1

## 2016-12-30 MED ORDER — VITAMIN K1 10 MG/ML IJ SOLN
10.0000 mg | INTRAVENOUS | Status: AC
  Administered 2016-12-30: 10 mg via INTRAVENOUS
  Filled 2016-12-30: qty 1

## 2016-12-30 NOTE — Telephone Encounter (Signed)
Thank you Sheena. Appropriate call here. Can you check in later this afternoon to make sure they are in ER?

## 2016-12-30 NOTE — ED Notes (Signed)
Pt back from x-ray.

## 2016-12-30 NOTE — ED Provider Notes (Signed)
[facility]-EMERGENCY DEPT Provider Note   CSN: [id number] Arrival date & time: 12/30/16  1101     History   Chief Complaint Chief Complaint  Patient presents with  . Fall    HPI [name] is a 83 y.o. male.  The history is provided by the patient and medical records. No language interpreter was used.  Fall    [name] is a 83 y.o. male  with a PMH as listed below who presents to the Emergency Department For evaluation after a fall which occurred 2 days ago. Patient states that he was sitting on his porch, went to get up, and was unable to lift his right foot up high enough to get inside the house. He tripped and fell, striking the right side of his head, right hip and right elbow. He sustained abrasion to the right side of his head and skin tear to right elbow. He denies any headache or elbow pain at this time. He is complaining of right hip and thigh pain today. He was able to get up independently and walk inside following fall. He typically ambulated with a cane or walker at baseline. Since the fall, wife notes that he seems "weak". He never exhibited any slurred speech or unilateral muscle weakness. He is on Coumadin. Wife states his last INR check was last week when it was 2.2-chart confirms this. No medications taken prior to arrival for symptoms. Tetanus up-to-date.  Past Medical History:  Diagnosis Date  . Aortic valve disease   . Cardiomyopathy, nonischemic (HCC)   . Cellulitis and abscess of upper arm and forearm 12/11/2008   Qualifier: Diagnosis of  By: Lovell Sheehan MD, Balinda Quails   . CHF (congestive heart failure) (HCC)   . Chronic renal insufficiency   . Defibrillator activation   . DIVERTICULOSIS OF COLON 03/31/2007   Qualifier: Diagnosis of  By: Creta Levin CMA (AAMA), Robin    . Hyperlipidemia   . Hypertension   . LBBB (left bundle branch block)   . Long term (current) use of anticoagulants   . S/P ablation of atrial fibrillation   . S/P AVR (aortic valve replacement) and  aortoplasty   . Tachycardia induced cardiomyopathy (HCC)   . Thyroid disease     Patient Active Problem List   Diagnosis Date Noted  . SAH (subarachnoid hemorrhage) (HCC) 12/30/2016  . Generalized weakness 12/30/2016  . Memory loss 08/12/2016  . Balance problem 08/02/2014  . CAD (coronary artery disease) 06/24/2014  . Diabetes mellitus type II, uncontrolled (HCC) 06/23/2014  . Encounter for therapeutic drug monitoring 10/06/2013  . Chronic systolic heart failure (HCC) 09/25/2009  . Automatic implantable cardioverter-defibrillator in situ 09/25/2009  . Back pain, chronic 08/10/2009  . CKD (chronic kidney disease), stage II 12/07/2007  . Localized osteoarthrosis, lower leg 04/13/2007  . Hypothyroidism 02/12/2007  . Hyperlipidemia 02/12/2007  . Essential hypertension 02/12/2007  . Atrial fibrillation (HCC) 02/12/2007    Past Surgical History:  Procedure Laterality Date  . AORTIC VALVE SURGERY  1992   St. Jude Valve  . CARDIAC CATHETERIZATION  12/04/04  . CORONARY ARTERY BYPASS GRAFT    . pacmaker,defibr  2006       Home Medications    Prior to Admission medications   Medication Sig Start Date End Date Taking? Authorizing Provider  aspirin 81 MG tablet Take 81 mg by mouth daily.     Yes [provider]  atorvastatin (LIPITOR) 20 MG tablet TAKE 1 TABLET ONCE DAILY. 10/24/16  Yes Hunter,  Aldine Contes, MD  calcium carbonate (OS-CAL) 600 MG TABS Take 1 tablet (600 mg total) by mouth daily. 03/08/12  Yes Stacie Glaze, MD  furosemide (LASIX) 40 MG tablet TAKE 1 TABLET ONCE DAILY. 10/24/16  Yes Marinus Maw, MD  irbesartan (AVAPRO) 300 MG tablet TAKE 1 TABLET ONCE DAILY. 07/28/16  Yes Shelva Majestic, MD  levothyroxine (SYNTHROID, LEVOTHROID) 100 MCG tablet TAKE 1 TABLET ONCE DAILY BEFORE BREAKFAST. 10/24/16  Yes Shelva Majestic, MD  metFORMIN (GLUCOPHAGE) 500 MG tablet TAKE 1 TABLET EACH DAY. 05/27/16  Yes Shelva Majestic, MD  metoprolol (LOPRESSOR) 50 MG tablet TAKE  1&1/2 TABLETS TWICE DAILY. 10/24/16  Yes Marinus Maw, MD  potassium chloride SA (K-DUR,KLOR-CON) 20 MEQ tablet TAKE 1 TABLET ONCE DAILY. 10/24/16  Yes Marinus Maw, MD  warfarin (COUMADIN) 2 MG tablet TAKE 2 tablets on Monday & 2 tablets on Friday or as directed by the [facility] Patient taking differently: Take 2-4 mg by mouth one time only at 6 PM. 4 mg on Monday and Friday and 2 mg all other days 11/06/16  Yes Shelva Majestic, MD    Family History Family History  Problem Relation Age of Onset  . Heart disease Mother   . Lung disease Mother   . Heart attack Father   . Stroke Brother   . Hypertension Brother     Social History Social History  Substance Use Topics  . Smoking status: Former Smoker    Quit date: 08/25/1968  . Smokeless tobacco: Never Used     Comment: "burned up more than he smoked"   . Alcohol use 3.5 oz/week    7 Standard drinks or equivalent per week     Comment: occasional beer glass of wine      Allergies   Amoxicillin; Ezetimibe-simvastatin; Penicillins; and Sulfamethoxazole-trimethoprim   Review of Systems Review of Systems  Musculoskeletal: Positive for arthralgias and myalgias. Negative for back pain and neck pain.  Skin: Positive for wound.  All other systems reviewed and are negative.    Physical Exam Updated Vital Signs BP (!) 141/76   Pulse 70   Temp 98.3 F (36.8 C) (Oral)   Resp (!) 23   SpO2 95%   Physical Exam  Constitutional: He is oriented to person, place, and time. He appears well-developed and well-nourished. No distress.  HENT:  Head: Normocephalic. Head is without raccoon's eyes and without Battle's sign.    Neck:  Full range of motion without pain. No midline tenderness.  Cardiovascular: Normal rate, regular rhythm and normal heart sounds.   No murmur heard. Pulmonary/Chest: Effort normal and breath sounds normal. No respiratory distress.  Abdominal: Soft. He exhibits no distension. There is no tenderness.    Musculoskeletal:  Tenderness to palpation along right lateral hip and right thigh. Bruising to right thigh.  Neurological: He is alert and oriented to person, place, and time.  Alert, oriented, thought content appropriate, able to give a coherent history. Speech is clear and goal oriented, able to follow commands.  Cranial Nerves:  II:  Peripheral visual fields grossly normal, pupils equal, round, reactive to light III, IV, VI: EOM intact bilaterally, ptosis not present V,VII: smile symmetric, eyes kept closed tightly against resistance, facial light touch sensation equal VIII: hearing grossly normal IX, X: symmetric soft palate movement, uvula elevates symmetrically  XI: bilateral shoulder shrug symmetric and strong XII: midline tongue extension Sensory to light touch normal in all four extremities.  Normal finger-to-nose and  rapid alternating movements.  Skin: Skin is warm and dry.  Skin tear to right elbow. Elbow has full range of motion without pain and no tenderness to palpation.  Nursing note and vitals reviewed.    ED Treatments / Results  Labs (all labs ordered are listed, but only abnormal results are displayed) Labs Reviewed  PROTIME-INR - Abnormal; Notable for the following:       Result Value   Prothrombin Time 28.9 (*)    All other components within normal limits  CBC - Abnormal; Notable for the following:    WBC 11.2 (*)    RBC 3.87 (*)    Hemoglobin 11.3 (*)    HCT 34.2 (*)    All other components within normal limits  BASIC METABOLIC PANEL - Abnormal; Notable for the following:    Glucose, Bld 154 (*)    BUN 28 (*)    All other components within normal limits  PROTIME-INR  PROTIME-INR  URINALYSIS, ROUTINE W REFLEX MICROSCOPIC  TSH    EKG  EKG Interpretation None       Radiology Dg Pelvis 1-2 Views  Result Date: 12/30/2016 CLINICAL DATA:  Status post fall on patio at home Saturday. The patient reports proximal to mid right thigh pain. EXAM: PELVIS  - 1-2 VIEW COMPARISON:  None in PACs FINDINGS: The bones are subjectively osteopenic. The pelvis exhibits no acute fracture or lytic or blastic lesion. There is mild narrowing of both hip joints consistent with osteoarthritis. The observed portions of the femoral heads, necks, and intertrochanteric regions are normal. There is dense calcification in the external iliac arteries and femoral arteries. IMPRESSION: Mild degenerative joint space loss of both hips. No acute hip or pelvis fracture. Electronically Signed   By: David  [place] M.D.   On: 12/30/2016 12:29   Ct Head Wo Contrast  Result Date: 12/30/2016 CLINICAL DATA:  Larey Seat on a patio step and hit his head in the right temporal area today. EXAM: CT HEAD WITHOUT CONTRAST TECHNIQUE: Contiguous axial images were obtained from the base of the skull through the vertex without intravenous contrast. COMPARISON:  None. FINDINGS: Brain: Diffusely enlarged ventricles and subarachnoid spaces. Patchy white matter low density in both cerebral hemispheres. 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 Old bilateral cerebellar hemisphere infarcts. Small amount of blood in the posterior sylvian fissure on the right. No intraventricular or extra-axial blood. No mass effect. Vascular: No hyperdense vessel or unexpected calcification. Skull: Normal. Negative for fracture or focal lesion. Sinuses/Orbits: Unremarkable. Other: Small right lateral scalp hematoma. IMPRESSION: 1. Small amount of subarachnoid hemorrhage in the sylvian fissure on the right. 2. Moderate to marked diffuse cerebral and cerebellar atrophy and chronic small vessel white matter ischemic changes in both cerebral hemispheres. 3. 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 Critical Value/emergent results were called by telephone at the time of interpretation on 12/30/2016 at 12:47 pm to [facility] who verbally acknowledged these results. Electronically Signed   By: Beckie Salts M.D.   On:  12/30/2016 12:49   Dg Femur Min 2 Views Right  Result Date: 12/30/2016 CLINICAL DATA:  Status post fall at home 3 days ago with persistent proximal to mid right thigh pain. EXAM: RIGHT FEMUR 2 VIEWS COMPARISON:  AP pelvis of today's date FINDINGS: The femur is subjectively osteopenic. There is no acute fracture. There is mild narrowing of the right hip joint space and of the medial, lateral, and patellofemoral compartments of the knee. There is no lytic or blastic lesion  or periosteal reaction. There is dense calcification in the wall of the superficial femoral artery as well as the popliteal artery. IMPRESSION: No acute bony abnormality of the right femur is observed. There are mild degenerative changes of the right hip and mild-to-moderate tricompartmental degenerative change of the right knee. Electronically Signed   By: David  [place] M.D.   On: 12/30/2016 12:30    Procedures Procedures (including critical care time)  CRITICAL CARE Performed by: Chase Picket Rejoice Heatwole   Total critical care time: 35 minutes  Critical care time was exclusive of separately billable procedures and treating other patients.  Critical care was necessary to treat or prevent imminent or life-threatening deterioration.  Critical care was time spent personally by me on the following activities: development of treatment plan with patient and/or surrogate as well as nursing, discussions with consultants, evaluation of patient's response to treatment, examination of patient, obtaining history from patient or surrogate, ordering and performing treatments and interventions, ordering and review of laboratory studies, ordering and review of radiographic studies, pulse oximetry and re-evaluation of patient's condition.   Medications Ordered in ED Medications  levETIRAcetam (KEPPRA) tablet 500 mg (500 mg Oral Given 12/30/16 1507)  furosemide (LASIX) tablet 40 mg (not administered)  potassium chloride SA (K-DUR,KLOR-CON) CR tablet  20 mEq (not administered)  atorvastatin (LIPITOR) tablet 20 mg (not administered)  levothyroxine (SYNTHROID, LEVOTHROID) tablet 100 mcg (not administered)  calcium carbonate (OS-CAL) tablet 600 mg (not administered)  sodium chloride flush (NS) 0.9 % injection 3 mL (not administered)  0.9 %  sodium chloride infusion (not administered)  acetaminophen (TYLENOL) tablet 650 mg (not administered)    Or  acetaminophen (TYLENOL) suppository 650 mg (not administered)  ondansetron (ZOFRAN) tablet 4 mg (not administered)    Or  ondansetron (ZOFRAN) injection 4 mg (not administered)  insulin aspart (novoLOG) injection 0-15 Units (not administered)  insulin aspart (novoLOG) injection 0-5 Units (not administered)  prothrombin complex conc human (KCENTRA) IVPB 1,521 Units (0 Units Intravenous Stopped 12/30/16 1436)  phytonadione (VITAMIN K) 10 mg in dextrose 5 % 50 mL IVPB (10 mg Intravenous New Bag/Given 12/30/16 1436)     Initial Impression / Assessment and Plan / ED Course  I have reviewed the triage vital signs and the nursing notes.  Pertinent labs & imaging results that were available during my care of the patient were reviewed by me and considered in my medical decision making (see chart for details).    [name] is a 83 y.o. male who presents to ED for evaluation after a fall 2 days ago he struck his head, right elbow and right hip. He is on Coumadin with INR of 2.66. No focal neuro deficits on exam. Abrasion to right temporal area and skin tear to right elbow: tetanus up-to-date, wounds thoroughly cleaned and dressed in ED today by me. Tenderness to palpation of right hip and swelling/bruising to right thigh. X-rays negative for acute injury. CT head with small amount of subarachnoid hemorrhage in the right sylvian fissure. Neurosurgery consulted who has evaluated patient at bedside. Admitted to hospitalist.   Patient seen by and discussed with Dr. Rosalia Hammers who agrees with treatment plan.     Final Clinical Impressions(s) / ED Diagnoses   Final diagnoses:  Fall, initial encounter  Subarachnoid hemorrhage (HCC)  Hematoma of scalp, initial encounter  Skin abrasion  Skin tear of right elbow without complication, initial encounter    New Prescriptions New Prescriptions   No medications on file  Shaniyah Wix, Chase Picket, PA-C 12/30/16 1529    Margarita Grizzle, MD 12/30/16 [phone]

## 2016-12-30 NOTE — Consult Note (Signed)
CC:  Chief Complaint  Patient presents with  . Fall    HPI: [name] is a 83 y.o. male who presented to ER for weakness after a fall. Pt reports missing a step and falling hitting right frontal/temporal head on Saturday morning. Denies LOC, seizure like activity, amnesia surrounding event. Since then, has felt generally weaker than usual without focal deficits or other falls. He is on coumadin for AVD so wife thought he may need imaging. Currently on reversal protocol. He denies neuro symptoms.  PMH: Past Medical History:  Diagnosis Date  . Aortic valve disease   . Cardiomyopathy, nonischemic (HCC)   . Cellulitis and abscess of upper arm and forearm 12/11/2008   Qualifier: Diagnosis of  By: Lovell Sheehan MD, Balinda Quails   . CHF (congestive heart failure) (HCC)   . Chronic renal insufficiency   . Defibrillator activation   . DIVERTICULOSIS OF COLON 03/31/2007   Qualifier: Diagnosis of  By: Creta Levin CMA (AAMA), Robin    . Hyperlipidemia   . Hypertension   . LBBB (left bundle branch block)   . Long term (current) use of anticoagulants   . S/P ablation of atrial fibrillation   . S/P AVR (aortic valve replacement) and aortoplasty   . Tachycardia induced cardiomyopathy (HCC)   . Thyroid disease     PSH: Past Surgical History:  Procedure Laterality Date  . AORTIC VALVE SURGERY  1992   St. Jude Valve  . CARDIAC CATHETERIZATION  12/04/04  . CORONARY ARTERY BYPASS GRAFT    . pacmaker,defibr  2006    SH: Social History  Substance Use Topics  . Smoking status: Former Smoker    Quit date: 08/25/1968  . Smokeless tobacco: Never Used     Comment: "burned up more than he smoked"   . Alcohol use 3.5 oz/week    7 Standard drinks or equivalent per week     Comment: occasional beer glass of wine     MEDS: Prior to Admission medications   Medication Sig Start Date End Date Taking? Authorizing Provider  aspirin 81 MG tablet Take 81 mg by mouth daily.     Yes [provider]   atorvastatin (LIPITOR) 20 MG tablet TAKE 1 TABLET ONCE DAILY. 10/24/16  Yes Shelva Majestic, MD  calcium carbonate (OS-CAL) 600 MG TABS Take 1 tablet (600 mg total) by mouth daily. 03/08/12  Yes Stacie Glaze, MD  furosemide (LASIX) 40 MG tablet TAKE 1 TABLET ONCE DAILY. 10/24/16  Yes Marinus Maw, MD  irbesartan (AVAPRO) 300 MG tablet TAKE 1 TABLET ONCE DAILY. 07/28/16  Yes Shelva Majestic, MD  levothyroxine (SYNTHROID, LEVOTHROID) 100 MCG tablet TAKE 1 TABLET ONCE DAILY BEFORE BREAKFAST. 10/24/16  Yes Shelva Majestic, MD  metFORMIN (GLUCOPHAGE) 500 MG tablet TAKE 1 TABLET EACH DAY. 05/27/16  Yes Shelva Majestic, MD  metoprolol (LOPRESSOR) 50 MG tablet TAKE 1&1/2 TABLETS TWICE DAILY. 10/24/16  Yes Marinus Maw, MD  potassium chloride SA (K-DUR,KLOR-CON) 20 MEQ tablet TAKE 1 TABLET ONCE DAILY. 10/24/16  Yes Marinus Maw, MD  warfarin (COUMADIN) 2 MG tablet TAKE 2 tablets on Monday & 2 tablets on Friday or as directed by the [facility] Patient taking differently: Take 2-4 mg by mouth one time only at 6 PM. 4 mg on Monday and Friday and 2 mg all other days 11/06/16  Yes Shelva Majestic, MD    ALLERGY: Allergies  Allergen Reactions  . Amoxicillin     REACTION: unspecified  UNKNOWN  . Ezetimibe-Simvastatin     REACTION: unspecified UNKNOWN  . Penicillins Nausea Only  . Sulfamethoxazole-Trimethoprim     REACTION: nausea    ROS: Review of Systems  Constitutional: Negative.   HENT: Negative.   Eyes: Negative.   Respiratory: Negative.   Cardiovascular: Negative.   Gastrointestinal: Negative.   Genitourinary: Negative.   Musculoskeletal: Negative.   Skin: Negative.   Neurological: Negative for dizziness, tingling, tremors, sensory change, speech change, focal weakness, seizures, loss of consciousness and headaches.  Endo/Heme/Allergies: Bruises/bleeds easily.    Vitals:   12/30/16 1106  BP: (!) 182/79  Pulse: 70  Resp: 12  Temp: 98.7 F (37.1 C)   General  appearance: WDWN, NAD Eyes: PERRL, Fundoscopic: normal Cardiovascular: Regular rate and rhythm without murmurs, rubs, gallops. No edema or variciosities. Distal pulses normal. Pulmonary: Clear to auscultation Musculoskeletal:     Muscle tone upper extremities: Normal    Muscle tone lower extremities: Normal    Motor exam: Upper Extremities Deltoid Bicep Tricep Grip  Right 5/5 5/5 5/5 5/5  Left 5/5 5/5 5/5 5/5   Lower Extremity IP Quad PF DF EHL  Right 5/5 5/5 5/5 5/5 5/5  Left 5/5 5/5 5/5 5/5 5/5   Neurological Awake, alert, oriented Memory and concentration grossly intact Speech fluent, appropriate CNII: Visual fields normal CNIII/IV/VI: EOMI CNV: Facial sensation normal CNVII: Symmetric, normal strength CNVIII: Grossly normal CNIX: Normal palate movement CNXI: Trap and SCM strength normal CN XII: Tongue protrusion normal Sensation grossly intact to LT DTR: Normal Coordination (finger/nose & heel/shin): Normal  IMAGING: CT HEAD IMPRESSION: 1. Small amount of subarachnoid hemorrhage in the sylvian fissure on the right. 2. Moderate to marked diffuse cerebral and cerebellar atrophy and chronic small vessel white matter ischemic changes in both cerebral hemispheres. 3. 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  IMPRESSION/PLAN - 83 y.o. male with small, focal area of SAH. He denies neuro symptoms and is neurologically intact. No surgical intervention is indicated for this small SAH. His fall was >24 hours ago. No need for repeat imaging. Start Keppra 500mg  BID x7days for seizure prophylaxis. Follow up outpt within 1 week for repeat scan. Hold coumadin until repeat scan. Call for any concerns.

## 2016-12-30 NOTE — ED Notes (Signed)
Report given.

## 2016-12-30 NOTE — ED Notes (Signed)
Report attempted to 5M. 

## 2016-12-30 NOTE — ED Triage Notes (Signed)
Patient states that he tripped and fell on Saturday. He did hit his head and  Has a small abrasion with bruising on the R side of his forehead. Swelling and discoloration on the R femur and knee. Dyspnea with exertion and some accessory muscle use, lungs clear but diminished at bases. No LOC, no CO headache and pupils equal and reactive. Minimal CO of pain and pulses palpable in the R foot although it does feel cooler than the L

## 2016-12-30 NOTE — Telephone Encounter (Signed)
Patient's wife called to report that pt fell this past Saturday and hit his head. There was some minimal bleeding. She denies LOC. He has been gradually declining, not wanting to move, not wanting to eat and has become incontinent. I advised her that pt needs to be seen and evaluated immediately. She states that she cannot get him up and out of the house. Advised her to call EMS for transport to ED for evaluation. Pt does have cardiac hx and is on Coumadin. She agrees and will have him transported to [facility] ED.   Dr. Durene Cal - FYI. Thanks!

## 2016-12-30 NOTE — ED Notes (Signed)
Pt going to CT

## 2016-12-30 NOTE — ED Notes (Signed)
PA at bedside to irrigate wound

## 2016-12-30 NOTE — Telephone Encounter (Signed)
Patient has been admitted to hospital.

## 2016-12-30 NOTE — Telephone Encounter (Signed)
Per CT pt has: Small amount of subarachnoid hemorrhage in the sylvian fissure on the right.  Dr. Durene Cal - Lorain Childes

## 2016-12-30 NOTE — H&P (Addendum)
History and Physical    [name] ZYY:[id number] DOB: [birth year] DOA: 12/30/2016  PCP: Shelva Majestic, MD Patient coming from: home  Chief Complaint: generalized weakness/fall  HPI: [name] is a pleasant 83 y.o. male with medical history significant for A. fib on Coumadin, COPD, chronic systolic heart failure, chronic kidney disease stage II, diabetes, hypertension, presents to emergency Department chief complaint generalized weakness status post fall 3 days ago. Initial evaluation includes a CT of the head which reveals a small subarachnoid hemorrhage. Evaluated by neurosurgery who recommends admission for observation  Information is obtained from the patient and the wife is at the bedside. Patient reports he suffered a mechanical fall 3 days ago. Wife witnessed fall said he "tripped on the strap". She states he has an unsteady gait due to chronic right knee pain and uses a cane. He denies headache dizziness syncope or near-syncope. He denies chest pain palpitation shortness of breath lower extremity edema. He denies fever chills cough abdominal pain nausea vomiting diarrhea. Wife states she in the next door neighbor got the patient up improvement bed. She reports that for the next 3 days he would lay in bed complaining of "pain all over". She reports he would not eat or drink or take his medicines. She states he got to the point where she could not manage him and he would not cooperate with his care. Patient finally agreed to come to the emergency department. Patient does complain of pain on his right hip and leg. He was able to ambulate in the emergency department with minimal assist.    ED Course: In emergency department he's afebrile hemodynamically stable and not hypoxic. He is given IV fluids  Review of Systems: As per HPI otherwise all other systems reviewed and are negative.   Ambulatory Status: Baseline is unsteady gait due to chronic right knee pain uses a cane. Independent  with ADLs  Past Medical History:  Diagnosis Date  . Aortic valve disease   . Cardiomyopathy, nonischemic (HCC)   . Cellulitis and abscess of upper arm and forearm 12/11/2008   Qualifier: Diagnosis of  By: Lovell Sheehan MD, Balinda Quails   . CHF (congestive heart failure) (HCC)   . Chronic renal insufficiency   . Defibrillator activation   . DIVERTICULOSIS OF COLON 03/31/2007   Qualifier: Diagnosis of  By: Creta Levin CMA (AAMA), Robin    . Hyperlipidemia   . Hypertension   . LBBB (left bundle branch block)   . Long term (current) use of anticoagulants   . S/P ablation of atrial fibrillation   . S/P AVR (aortic valve replacement) and aortoplasty   . Tachycardia induced cardiomyopathy (HCC)   . Thyroid disease     Past Surgical History:  Procedure Laterality Date  . AORTIC VALVE SURGERY  1992   St. Jude Valve  . CARDIAC CATHETERIZATION  12/04/04  . CORONARY ARTERY BYPASS GRAFT    . pacmaker,defibr  2006    Social History   Social History  . Marital status: Married    Spouse name: N/A  . Number of children: N/A  . Years of education: N/A   Occupational History  . Not on file.   Social History Main Topics  . Smoking status: Former Smoker    Quit date: 08/25/1968  . Smokeless tobacco: Never Used     Comment: "burned up more than he smoked"   . Alcohol use 3.5 oz/week    7 Standard drinks or equivalent per week  Comment: occasional beer glass of wine   . Drug use: No  . Sexual activity: Yes   Other Topics Concern  . Not on file   Social History Narrative   Married 2001. 2 kids. No grandkids.       Retired from [employer]      Hobbies: tv    Allergies  Allergen Reactions  . Amoxicillin     REACTION: unspecified UNKNOWN  . Ezetimibe-Simvastatin     REACTION: unspecified UNKNOWN  . Penicillins Nausea Only  . Sulfamethoxazole-Trimethoprim     REACTION: nausea    Family History  Problem Relation Age of Onset  . Heart disease Mother   . Lung disease  Mother   . Heart attack Father   . Stroke Brother   . Hypertension Brother     Prior to Admission medications   Medication Sig Start Date End Date Taking? Authorizing Provider  aspirin 81 MG tablet Take 81 mg by mouth daily.     Yes [provider]  atorvastatin (LIPITOR) 20 MG tablet TAKE 1 TABLET ONCE DAILY. 10/24/16  Yes Shelva Majestic, MD  calcium carbonate (OS-CAL) 600 MG TABS Take 1 tablet (600 mg total) by mouth daily. 03/08/12  Yes Stacie Glaze, MD  furosemide (LASIX) 40 MG tablet TAKE 1 TABLET ONCE DAILY. 10/24/16  Yes Marinus Maw, MD  irbesartan (AVAPRO) 300 MG tablet TAKE 1 TABLET ONCE DAILY. 07/28/16  Yes Shelva Majestic, MD  levothyroxine (SYNTHROID, LEVOTHROID) 100 MCG tablet TAKE 1 TABLET ONCE DAILY BEFORE BREAKFAST. 10/24/16  Yes Shelva Majestic, MD  metFORMIN (GLUCOPHAGE) 500 MG tablet TAKE 1 TABLET EACH DAY. 05/27/16  Yes Shelva Majestic, MD  metoprolol (LOPRESSOR) 50 MG tablet TAKE 1&1/2 TABLETS TWICE DAILY. 10/24/16  Yes Marinus Maw, MD  potassium chloride SA (K-DUR,KLOR-CON) 20 MEQ tablet TAKE 1 TABLET ONCE DAILY. 10/24/16  Yes Marinus Maw, MD  warfarin (COUMADIN) 2 MG tablet TAKE 2 tablets on Monday & 2 tablets on Friday or as directed by the [facility] Patient taking differently: Take 2-4 mg by mouth one time only at 6 PM. 4 mg on Monday and Friday and 2 mg all other days 11/06/16  Yes Shelva Majestic, MD    Physical Exam: Vitals:   12/30/16 1430 12/30/16 1500 12/30/16 1530 12/30/16 1627  BP: (!) 145/78 (!) 141/76 (!) 117/97 (!) 150/78  Pulse: 69 70 70 70  Resp: 19 (!) 23 (!) 21 18  Temp:    98.6 F (37 C)  TempSrc:    Oral  SpO2: 97% 95% 96% 93%     General:  Appears calm and comfortable, slightly pale Eyes:  PERRL, EOMI, normal lids, iris ENT:  grossly normal hearing, lips & tongue, mucous membranes of his mouth are somewhat pale and dry Neck:  no LAD, masses or thyromegaly Cardiovascular:  RRR, no m/r/g. No LE edema.    Respiratory:  CTA bilaterally, no w/r/r. Normal respiratory effort. Abdomen:  soft, ntnd, positive bowel sounds no guarding or rebounding Skin:  no rash or induration seen on limited exam Musculoskeletal:  grossly normal tone BUE/BLE, good ROM, no bony abnormality Psychiatric:  grossly normal mood and affect, speech fluent and appropriate, AOx3 Neurologic:  CN 2-12 grossly intact, moves all extremities in coordinated fashion, sensation intact alert and oriented to self and place. Bilateral grip 5 out of 5. Speech slow but clear. Follows commands.  Labs on Admission: I have personally reviewed following labs and imaging studies  CBC:  Recent Labs Lab 12/30/16 1344  WBC 11.2*  HGB 11.3*  HCT 34.2*  MCV 88.4  PLT 157   Basic Metabolic Panel:  Recent Labs Lab 12/30/16 1400  NA 136  K 4.6  CL 102  CO2 27  GLUCOSE 154*  BUN 28*  CREATININE 1.09  CALCIUM 9.3   GFR: CrCl cannot be calculated (Unknown ideal weight.). Liver Function Tests: No results for input(s): AST, ALT, ALKPHOS, BILITOT, PROT, ALBUMIN in the last 168 hours. No results for input(s): LIPASE, AMYLASE in the last 168 hours. No results for input(s): AMMONIA in the last 168 hours. Coagulation Profile:  Recent Labs Lab 12/30/16 1344  INR 2.66   Cardiac Enzymes: No results for input(s): CKTOTAL, CKMB, CKMBINDEX, TROPONINI in the last 168 hours. BNP (last 3 results) No results for input(s): PROBNP in the last 8760 hours. HbA1C: No results for input(s): HGBA1C in the last 72 hours. CBG: No results for input(s): GLUCAP in the last 168 hours. Lipid Profile: No results for input(s): CHOL, HDL, LDLCALC, TRIG, CHOLHDL, LDLDIRECT in the last 72 hours. Thyroid Function Tests: No results for input(s): TSH, T4TOTAL, FREET4, T3FREE, THYROIDAB in the last 72 hours. Anemia Panel: No results for input(s): VITAMINB12, FOLATE, FERRITIN, TIBC, IRON, RETICCTPCT in the last 72 hours. Urine analysis: No results found  for: COLORURINE, APPEARANCEUR, LABSPEC, PHURINE, GLUCOSEU, HGBUR, BILIRUBINUR, KETONESUR, PROTEINUR, UROBILINOGEN, NITRITE, LEUKOCYTESUR  Creatinine Clearance: CrCl cannot be calculated (Unknown ideal weight.).  Sepsis Labs: @LABRCNTIP (procalcitonin:4,lacticidven:4) )No results found for this or any previous visit (from the past 240 hour(s)).   Radiological Exams on Admission: Dg Pelvis 1-2 Views  Result Date: 12/30/2016 CLINICAL DATA:  Status post fall on patio at home Saturday. The patient reports proximal to mid right thigh pain. EXAM: PELVIS - 1-2 VIEW COMPARISON:  None in PACs FINDINGS: The bones are subjectively osteopenic. The pelvis exhibits no acute fracture or lytic or blastic lesion. There is mild narrowing of both hip joints consistent with osteoarthritis. The observed portions of the femoral heads, necks, and intertrochanteric regions are normal. There is dense calcification in the external iliac arteries and femoral arteries. IMPRESSION: Mild degenerative joint space loss of both hips. No acute hip or pelvis fracture. Electronically Signed   By: David  [place] M.D.   On: 12/30/2016 12:29   Ct Head Wo Contrast  Result Date: 12/30/2016 CLINICAL DATA:  Larey Seat on a patio step and hit his head in the right temporal area today. EXAM: CT HEAD WITHOUT CONTRAST TECHNIQUE: Contiguous axial images were obtained from the base of the skull through the vertex without intravenous contrast. COMPARISON:  None. FINDINGS: Brain: Diffusely enlarged ventricles and subarachnoid spaces. Patchy white matter low density in both cerebral hemispheres. 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 Old bilateral cerebellar hemisphere infarcts. Small amount of blood in the posterior sylvian fissure on the right. No intraventricular or extra-axial blood. No mass effect. Vascular: No hyperdense vessel or unexpected calcification. Skull: Normal. Negative for fracture or focal lesion. Sinuses/Orbits: Unremarkable. Other: Small right  lateral scalp hematoma. IMPRESSION: 1. Small amount of subarachnoid hemorrhage in the sylvian fissure on the right. 2. Moderate to marked diffuse cerebral and cerebellar atrophy and chronic small vessel white matter ischemic changes in both cerebral hemispheres. 3. 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 Critical Value/emergent results were called by telephone at the time of interpretation on 12/30/2016 at 12:47 pm to [facility] who verbally acknowledged these results. Electronically Signed   By: Zada Finders.D.  On: 12/30/2016 12:49   Dg Femur Min 2 Views Right  Result Date: 12/30/2016 CLINICAL DATA:  Status post fall at home 3 days ago with persistent proximal to mid right thigh pain. EXAM: RIGHT FEMUR 2 VIEWS COMPARISON:  AP pelvis of today's date FINDINGS: The femur is subjectively osteopenic. There is no acute fracture. There is mild narrowing of the right hip joint space and of the medial, lateral, and patellofemoral compartments of the knee. There is no lytic or blastic lesion or periosteal reaction. There is dense calcification in the wall of the superficial femoral artery as well as the popliteal artery. IMPRESSION: No acute bony abnormality of the right femur is observed. There are mild degenerative changes of the right hip and mild-to-moderate tricompartmental degenerative change of the right knee. Electronically Signed   By: David  [place] M.D.   On: 12/30/2016 12:30    EKG:   Assessment/Plan Principal Problem:   SAH (subarachnoid hemorrhage) (HCC) Active Problems:   Hypothyroidism   Chronic systolic heart failure (HCC)   CKD (chronic kidney disease), stage II   Back pain, chronic   Automatic implantable cardioverter-defibrillator in situ   Diabetes mellitus type II, uncontrolled (HCC)   CAD (coronary artery disease)   Memory loss   Generalized weakness   S/P AVR (aortic valve replacement) and aortoplasty   #1. Subarachnoid hemorrhage status post  fall and patient is on Coumadin. INR 2.2. Witnessed fall reported to be mechanical 3 days ago. No loss of consciousness patient has little memory of event. Vitamin with vitamin K via reversal protocol. Evaluated by neurology who opined small focal area of [facility] with neurology intact. Opined no surgical intervention indicated but recommended admission for observation and Keppra for 7 days. -Admit -Gentle IV fluids -Frequent neuro checks -Hold Coumadin -Keppra per neurosurgery -We'll defer resumption of anticoagulation to neurosurgery  #2. Fall. Reported mechanical. Patient with chronic right knee pain uses a cane at baseline. Complains of right hip and leg pain. X-ray no acute bony abnormalities but degeneration of the hip. -Physical therapy  #3. Generalized weakness. Likely related to 3 days of lying in bed and not taking in nutrients or water. Patient denies abdominal pain or nausea but does report no appetite -Physical therapy -Nutritional consult  #4. Status post aortic valve replacement and aortoplasty. On Coumadin. INR 2.2. -Holding Coumadin per #1 -Resume and once cleared by neurosurgery  #5. Chronic kidney disease. Stage II. Stable at baseline  #6. Chronic systolic heart failure. Compensated -continue homemed  #7. Hypertension. Fair control in the emergency department. Home medications include Lasix, Avapro, metoprolol -We will hold Lasix until tomorrow -Continue Avapro and metoprolol  #8. Diabetes type 2. Serum glucose 154 on admission. Home medications include metformin -Hold metformin for now -Obtain a hemoglobin A1c -Sliding scale insulin for optimal control  #9. CAD. No chest pain. -Hold home low-dose aspirin for now -Continue Lipitor   DVT prophylaxis: scd  Code Status: full Family Communication: wife at bedside  Disposition Plan: home  Consults called:  Dr Derryl Harbor neurosurger Admission status: obs    Gwenyth Bender MD [facility]  If 7PM-7AM, please  contact night-coverage [email or web address] Password [facility]  12/30/2016, 4:42 PM    Attending MD note  Patient was seen, examined,treatment plan was discussed with the  Advance Practice Provider.  I have personally reviewed the clinical findings, lab,EKG, imaging studies and management of this patient in detail.I have also reviewed the orders written for this patient which were under my direction. I agree with  the documentation, as recorded by the Advance Practice Provider.   [name] is a 83 y.o. male who presents with SAH likely sustained after fall 3 days ago w/ worsening CNS symptoms. Neurosurgery consulted and following. Pt w/ poor physical reserve at baseline and fairly deconditioned.    MERRELL, Elmon Else, MD Family Medicine See Amion for pager # [facility]

## 2016-12-31 DIAGNOSIS — I130 Hypertensive heart and chronic kidney disease with heart failure and stage 1 through stage 4 chronic kidney disease, or unspecified chronic kidney disease: Secondary | ICD-10-CM | POA: Diagnosis not present

## 2016-12-31 DIAGNOSIS — I4891 Unspecified atrial fibrillation: Secondary | ICD-10-CM | POA: Diagnosis not present

## 2016-12-31 DIAGNOSIS — E1165 Type 2 diabetes mellitus with hyperglycemia: Secondary | ICD-10-CM | POA: Diagnosis present

## 2016-12-31 DIAGNOSIS — M549 Dorsalgia, unspecified: Secondary | ICD-10-CM | POA: Diagnosis not present

## 2016-12-31 DIAGNOSIS — N182 Chronic kidney disease, stage 2 (mild): Secondary | ICD-10-CM | POA: Diagnosis not present

## 2016-12-31 DIAGNOSIS — T45515A Adverse effect of anticoagulants, initial encounter: Secondary | ICD-10-CM | POA: Diagnosis present

## 2016-12-31 DIAGNOSIS — E039 Hypothyroidism, unspecified: Secondary | ICD-10-CM | POA: Diagnosis not present

## 2016-12-31 DIAGNOSIS — S51011A Laceration without foreign body of right elbow, initial encounter: Secondary | ICD-10-CM | POA: Diagnosis present

## 2016-12-31 DIAGNOSIS — R488 Other symbolic dysfunctions: Secondary | ICD-10-CM | POA: Diagnosis not present

## 2016-12-31 DIAGNOSIS — M1611 Unilateral primary osteoarthritis, right hip: Secondary | ICD-10-CM | POA: Diagnosis not present

## 2016-12-31 DIAGNOSIS — I6200 Nontraumatic subdural hemorrhage, unspecified: Secondary | ICD-10-CM | POA: Diagnosis not present

## 2016-12-31 DIAGNOSIS — G8929 Other chronic pain: Secondary | ICD-10-CM | POA: Diagnosis not present

## 2016-12-31 DIAGNOSIS — I5022 Chronic systolic (congestive) heart failure: Secondary | ICD-10-CM

## 2016-12-31 DIAGNOSIS — R1312 Dysphagia, oropharyngeal phase: Secondary | ICD-10-CM | POA: Diagnosis not present

## 2016-12-31 DIAGNOSIS — R2681 Unsteadiness on feet: Secondary | ICD-10-CM | POA: Diagnosis present

## 2016-12-31 DIAGNOSIS — R262 Difficulty in walking, not elsewhere classified: Secondary | ICD-10-CM | POA: Diagnosis not present

## 2016-12-31 DIAGNOSIS — M25561 Pain in right knee: Secondary | ICD-10-CM | POA: Diagnosis present

## 2016-12-31 DIAGNOSIS — E785 Hyperlipidemia, unspecified: Secondary | ICD-10-CM | POA: Diagnosis not present

## 2016-12-31 DIAGNOSIS — M5440 Lumbago with sciatica, unspecified side: Secondary | ICD-10-CM | POA: Diagnosis not present

## 2016-12-31 DIAGNOSIS — Z951 Presence of aortocoronary bypass graft: Secondary | ICD-10-CM | POA: Diagnosis not present

## 2016-12-31 DIAGNOSIS — S066X0A Traumatic subarachnoid hemorrhage without loss of consciousness, initial encounter: Secondary | ICD-10-CM | POA: Diagnosis present

## 2016-12-31 DIAGNOSIS — W010XXA Fall on same level from slipping, tripping and stumbling without subsequent striking against object, initial encounter: Secondary | ICD-10-CM | POA: Diagnosis present

## 2016-12-31 DIAGNOSIS — J449 Chronic obstructive pulmonary disease, unspecified: Secondary | ICD-10-CM | POA: Diagnosis not present

## 2016-12-31 DIAGNOSIS — I609 Nontraumatic subarachnoid hemorrhage, unspecified: Secondary | ICD-10-CM | POA: Diagnosis not present

## 2016-12-31 DIAGNOSIS — R413 Other amnesia: Secondary | ICD-10-CM | POA: Diagnosis present

## 2016-12-31 DIAGNOSIS — E1122 Type 2 diabetes mellitus with diabetic chronic kidney disease: Secondary | ICD-10-CM | POA: Diagnosis not present

## 2016-12-31 DIAGNOSIS — N183 Chronic kidney disease, stage 3 (moderate): Secondary | ICD-10-CM | POA: Diagnosis not present

## 2016-12-31 DIAGNOSIS — S0003XA Contusion of scalp, initial encounter: Secondary | ICD-10-CM | POA: Diagnosis present

## 2016-12-31 DIAGNOSIS — Z952 Presence of prosthetic heart valve: Secondary | ICD-10-CM | POA: Diagnosis not present

## 2016-12-31 DIAGNOSIS — M25551 Pain in right hip: Secondary | ICD-10-CM | POA: Diagnosis present

## 2016-12-31 DIAGNOSIS — Z87891 Personal history of nicotine dependence: Secondary | ICD-10-CM | POA: Diagnosis not present

## 2016-12-31 DIAGNOSIS — S066X0D Traumatic subarachnoid hemorrhage without loss of consciousness, subsequent encounter: Secondary | ICD-10-CM | POA: Diagnosis not present

## 2016-12-31 DIAGNOSIS — I2510 Atherosclerotic heart disease of native coronary artery without angina pectoris: Secondary | ICD-10-CM | POA: Diagnosis not present

## 2016-12-31 DIAGNOSIS — M6281 Muscle weakness (generalized): Secondary | ICD-10-CM | POA: Diagnosis not present

## 2016-12-31 DIAGNOSIS — Z95810 Presence of automatic (implantable) cardiac defibrillator: Secondary | ICD-10-CM | POA: Diagnosis not present

## 2016-12-31 LAB — GLUCOSE, CAPILLARY
GLUCOSE-CAPILLARY: 141 mg/dL — AB (ref 65–99)
GLUCOSE-CAPILLARY: 153 mg/dL — AB (ref 65–99)
GLUCOSE-CAPILLARY: 158 mg/dL — AB (ref 65–99)
Glucose-Capillary: 135 mg/dL — ABNORMAL HIGH (ref 65–99)

## 2016-12-31 LAB — CBC
HCT: 31.5 % — ABNORMAL LOW (ref 39.0–52.0)
HEMOGLOBIN: 10.4 g/dL — AB (ref 13.0–17.0)
MCH: 29.3 pg (ref 26.0–34.0)
MCHC: 33 g/dL (ref 30.0–36.0)
MCV: 88.7 fL (ref 78.0–100.0)
PLATELETS: 133 10*3/uL — AB (ref 150–400)
RBC: 3.55 MIL/uL — AB (ref 4.22–5.81)
RDW: 14.7 % (ref 11.5–15.5)
WBC: 8.9 10*3/uL (ref 4.0–10.5)

## 2016-12-31 LAB — BASIC METABOLIC PANEL
Anion gap: 11 (ref 5–15)
BUN: 28 mg/dL — AB (ref 6–20)
CALCIUM: 8.4 mg/dL — AB (ref 8.9–10.3)
CHLORIDE: 105 mmol/L (ref 101–111)
CO2: 21 mmol/L — AB (ref 22–32)
CREATININE: 1 mg/dL (ref 0.61–1.24)
GFR calc Af Amer: >60 mL/min (ref >60)
GFR calc non Af Amer: >60 mL/min (ref >60)
Glucose, Bld: 138 mg/dL — ABNORMAL HIGH (ref 65–99)
Potassium: 3.9 mmol/L (ref 3.5–5.1)
Sodium: 137 mmol/L (ref 135–145)

## 2016-12-31 LAB — PROTIME-INR
INR: 1.16
PROTHROMBIN TIME: 14.9 s (ref 11.4–15.2)

## 2016-12-31 LAB — URINALYSIS, ROUTINE W REFLEX MICROSCOPIC
Bacteria, UA: NONE SEEN
Bilirubin Urine: NEGATIVE
GLUCOSE, UA: NEGATIVE mg/dL
HGB URINE DIPSTICK: NEGATIVE
KETONES UR: 5 mg/dL — AB
LEUKOCYTES UA: NEGATIVE
NITRITE: NEGATIVE
PH: 5 (ref 5.0–8.0)
PROTEIN: 100 mg/dL — AB
Specific Gravity, Urine: 1.019 (ref 1.005–1.030)

## 2016-12-31 NOTE — Care Management Note (Signed)
Case Management Note  Patient Details  Name: [name] MRN: [id number] Date of Birth: 1932-06-29  Subjective/Objective:  Pt in with [facility]. He is from home with his wife. Per wife patient pretty IADL until after his fall 3 days ago. Since the fall he has been weak and required a lot more assistance.                  Action/Plan: PT recommending HH services. Per MD note and upon speaking with his wife patient may need SNF rehab prior to returning home. CSW informed. Patients wife would like him faxed out in [facility].  CM continuing to follow for discharge disposition.   Expected Discharge Date:                  Expected Discharge Plan:     In-House Referral:  Clinical Social Work  Discharge planning Services  CM Consult  Post Acute Care Choice:    Choice offered to:     DME Arranged:    DME Agency:     HH Arranged:    HH Agency:     Status of Service:  In process, will continue to follow  If discussed at Long Length of Stay Meetings, dates discussed:    Additional Comments:  Kermit Balo, RN 12/31/2016, 2:49 PM

## 2016-12-31 NOTE — [place] FL2 (Signed)
[place] MEDICAID FL2 LEVEL OF CARE SCREENING TOOL     IDENTIFICATION  Patient Name: [name] Birthdate: 15-Apr-1932 Sex: male Admission Date (Current Location): 12/30/2016  [facility] and [place] Number:  Producer, television/film/video and Address:  [facility] [place], [address] [place], [place] [address]      Provider Number: [id number]  Attending Physician Name and Address:  Richarda Overlie, MD  Relative Name and Phone Number:       Current Level of Care: SNF Recommended Level of Care: Skilled Nursing Facility Prior Approval Number:    Date Approved/Denied:   PASRR Number: [id number]  Discharge Plan: SNF    Current Diagnoses: Patient Active Problem List   Diagnosis Date Noted  . SAH (subarachnoid hemorrhage) (HCC) 12/30/2016  . Generalized weakness 12/30/2016  . S/P AVR (aortic valve replacement) and aortoplasty   . Diabetes mellitus with complication (HCC)   . Fall   . Memory loss 08/12/2016  . Balance problem 08/02/2014  . CAD (coronary artery disease) 06/24/2014  . Diabetes mellitus type II, uncontrolled (HCC) 06/23/2014  . Encounter for therapeutic drug monitoring 10/06/2013  . Chronic systolic heart failure (HCC) 09/25/2009  . Automatic implantable cardioverter-defibrillator in situ 09/25/2009  . Back pain, chronic 08/10/2009  . CKD (chronic kidney disease), stage II 12/07/2007  . Localized osteoarthrosis, lower leg 04/13/2007  . Hypothyroidism 02/12/2007  . Hyperlipidemia 02/12/2007  . Essential hypertension 02/12/2007  . Atrial fibrillation (HCC) 02/12/2007    Orientation RESPIRATION BLADDER Height & Weight     Self, Time, Situation, Place  Normal Continent Weight:   Height:     BEHAVIORAL SYMPTOMS/MOOD NEUROLOGICAL BOWEL NUTRITION STATUS      Continent    AMBULATORY STATUS COMMUNICATION OF NEEDS Skin   Limited Assist Verbally Normal                       Personal Care Assistance Level of Assistance  Bathing, Dressing  Bathing Assistance: Limited assistance   Dressing Assistance: Limited assistance     Functional Limitations Info             SPECIAL CARE FACTORS FREQUENCY  PT (By licensed PT), OT (By licensed OT)     PT Frequency: 5x/wk OT Frequency: 5x/wk            Contractures      Additional Factors Info  Code Status, Allergies, Insulin Sliding Scale Code Status Info: full Allergies Info: Amoxicillin, Ezetimibe-simvastatin, Penicillins, Sulfamethoxazole-trimethoprim   Insulin Sliding Scale Info: 3x/day       Current Medications (12/31/2016):  This is the current hospital active medication list Current Facility-Administered Medications  Medication Dose Route Frequency Provider Last Rate Last Dose  . acetaminophen (TYLENOL) tablet 650 mg  650 mg Oral Q6H PRN Gwenyth Bender, NP       Or  . acetaminophen (TYLENOL) suppository 650 mg  650 mg Rectal Q6H PRN Gwenyth Bender, NP      . atorvastatin (LIPITOR) tablet 20 mg  20 mg Oral q1800 Gwenyth Bender, NP   20 mg at 12/30/16 1733  . calcium-vitamin D (OSCAL WITH D) 500-200 MG-UNIT per tablet 1 tablet  1 tablet Oral Q breakfast Gwenyth Bender, NP   1 tablet at 12/31/16 7846  . furosemide (LASIX) tablet 40 mg  40 mg Oral Daily Gwenyth Bender, NP   40 mg at 12/31/16 9629  . insulin aspart (novoLOG) injection 0-15 Units  0-15 Units Subcutaneous  TID WC Gwenyth Bender, NP   2 Units at 12/31/16 1124  . insulin aspart (novoLOG) injection 0-5 Units  0-5 Units Subcutaneous QHS Black, Karen M, NP      . irbesartan (AVAPRO) tablet 300 mg  300 mg Oral Daily Gwenyth Bender, NP   300 mg at 12/30/16 1734  . levETIRAcetam (KEPPRA) tablet 500 mg  500 mg Oral BID Alyson Ingles, PA-C   500 mg at 12/31/16 3704  . levothyroxine (SYNTHROID, LEVOTHROID) tablet 100 mcg  100 mcg Oral QAC breakfast Gwenyth Bender, NP   100 mcg at 12/31/16 8889  . metoprolol tartrate (LOPRESSOR) tablet 75 mg  75 mg Oral BID Gwenyth Bender, NP   75 mg at 12/31/16 1694  .  ondansetron (ZOFRAN) tablet 4 mg  4 mg Oral Q6H PRN Gwenyth Bender, NP       Or  . ondansetron [facility]) injection 4 mg  4 mg Intravenous Q6H PRN Black, Karen M, NP      . potassium chloride SA (K-DUR,KLOR-CON) CR tablet 20 mEq  20 mEq Oral Daily Gwenyth Bender, NP   20 mEq at 12/31/16 0837  . sodium chloride flush (NS) 0.9 % injection 3 mL  3 mL Intravenous Q12H Black, Lesle Chris, NP         Discharge Medications: Please see discharge summary for a list of discharge medications.  Relevant Imaging Results:  Relevant Lab Results:   Additional Information SS#: [id number]  Baldemar Lenis, LCSW

## 2016-12-31 NOTE — Care Management Obs Status (Signed)
MEDICARE OBSERVATION STATUS NOTIFICATION   Patient Details  Name: [name] MRN: [id number] Date of Birth: [birth year]   Medicare Observation Status Notification Given:       Kermit Balo, RN 12/31/2016, 12:26 PM

## 2016-12-31 NOTE — Progress Notes (Signed)
CCMD reported patient had a 10 beat run of vtach. Pt asymptomatic. Pt sitting up in chair speaking with wife. Will continue to monitor.

## 2016-12-31 NOTE — Care Management Obs Status (Signed)
MEDICARE OBSERVATION STATUS NOTIFICATION   Patient Details  Name: [name] MRN: [id number] Date of Birth: Sep 08, 1931   Medicare Observation Status Notification Given:  Yes    Kermit Balo, RN 12/31/2016, 12:28 PM

## 2016-12-31 NOTE — Progress Notes (Signed)
[facility] PROGRESS NOTE  [name] ZOX:[id number] DOB: Jan 06, 1932 DOA: 12/30/2016   PCP: Shelva Majestic, MD     Assessment/Plan: Principal Problem:   SAH (subarachnoid hemorrhage) (HCC) Active Problems:   Hypothyroidism   Chronic systolic heart failure (HCC)   CKD (chronic kidney disease), stage II   Back pain, chronic   Automatic implantable cardioverter-defibrillator in situ   Diabetes mellitus type II, uncontrolled (HCC)   CAD (coronary artery disease)   Memory loss   Generalized weakness   S/P AVR (aortic valve replacement) and aortoplasty   Diabetes mellitus with complication [facility])   Fall     84 y.o. male with medical history significant for A. fib on Coumadin, COPD, chronic systolic heart failure, chronic kidney disease stage II, diabetes, hypertension, presents to emergency Department chief complaint generalized weakness status post fall 3 days ago. Patient had small abrasion with bruising on the R side of his forehead. Swelling and discoloration on the R femur and knee.Initial evaluation includes a CT of the head which reveals a small subarachnoid hemorrhage. Evaluated by neurosurgery who recommends admission for observation.  Assessment and plan  #1. Subarachnoid hemorrhage status post fall and patient is on Coumadin. INR 2.2. Witnessed fall reported to be mechanical 3 days ago. No loss of consciousness patient has little memory of event. Vitamin with vitamin K via reversal protocol. Evaluated by neurology who opined small focal area of [facility] with neurology intact. Opined no surgical intervention indicated but recommended admission for observation and Keppra for 7 days.  Neurochecks stable -Hold Coumadin -Keppra per neurosurgery -We'll defer resumption of anticoagulation to neurosurgery, patient needs repeat CT scan in one week   #2. Fall. Reported mechanical. Patient with chronic right knee pain uses a cane at baseline. Complains of right hip and leg  pain. X-ray no acute bony abnormalities but degeneration of the hip. -Physical therapy  #3. Generalized weakness. Likely related to 3 days of lying in bed and not taking in nutrients or water. Patient denies abdominal pain or nausea but does report no appetite -Physical therapy -Nutritional consult  #4. Status post aortic valve replacement and aortoplasty. On Coumadin. INR 2.2. -Holding Coumadin per #1 -Resume and once cleared by neurosurgery  #5. Chronic kidney disease. Stage II. Stable at baseline  #6. Chronic systolic heart failure. Compensated -continue homemed  #7. Hypertension. Fair control in the emergency department. Home medications include Lasix, Avapro, metoprolol -We will hold Lasix until tomorrow -Continue Avapro and metoprolol  #8. Diabetes type 2. Serum glucose 154 on admission. Home medications include metformin -Hold metformin for now -Obtain a hemoglobin A1c, most recent hemoglobin A1c 6.7, 08/12/16 -Sliding scale insulin for optimal control  #9. CAD. No chest pain. -Hold home low-dose aspirin for now -Continue Lipitor  DVT prophylaxsis SCDs  Code Status:  Full code      Family Communication: Discussed in detail with the patient's wife , all imaging results, lab results explained to the patient   Disposition Plan:  Anticipate discharge tomorrow, HH , vs SNF      Consultants:  Neurosurgery  Procedures:  None  Antibiotics: Anti-infectives    None         HPI/Subjective: Feels ok , wife by the bedside ,still needing assistance with ADL's   Objective: Vitals:   12/31/16 0047 12/31/16 0537 12/31/16 0835 12/31/16 0928  BP: (!) 142/61 (!) 146/64 (!) 117/41 (!) 119/55  Pulse: 70 70 70 69  Resp: 16 16 15  16  Temp: 98.3 F (36.8 C) 98 F (36.7 C) 98.5 F (36.9 C) 98.1 F (36.7 C)  TempSrc: Oral Oral Oral Oral  SpO2: 98% 94% 96% 97%   No intake or output data in the 24 hours ending 12/31/16  1113  Exam:  Examination:  General exam: Appears calm and comfortable  Respiratory system: Clear to auscultation. Respiratory effort normal. Cardiovascular system: S1 & S2 heard, RRR. No JVD, murmurs, rubs, gallops or clicks. No pedal edema. Gastrointestinal system: Abdomen is nondistended, soft and nontender. No organomegaly or masses felt. Normal bowel sounds heard. Central nervous system: Alert and oriented. No focal neurological deficits. Extremities: Symmetric 5 x 5 power. Skin: No rashes, lesions or ulcers Psychiatry: Judgement and insight appear normal. Mood & affect appropriate.     Data Reviewed: I have personally reviewed following labs and imaging studies  Micro Results No results found for this or any previous visit (from the past 240 hour(s)).  Radiology Reports Dg Pelvis 1-2 Views  Result Date: 12/30/2016 CLINICAL DATA:  Status post fall on patio at home Saturday. The patient reports proximal to mid right thigh pain. EXAM: PELVIS - 1-2 VIEW COMPARISON:  None in PACs FINDINGS: The bones are subjectively osteopenic. The pelvis exhibits no acute fracture or lytic or blastic lesion. There is mild narrowing of both hip joints consistent with osteoarthritis. The observed portions of the femoral heads, necks, and intertrochanteric regions are normal. There is dense calcification in the external iliac arteries and femoral arteries. IMPRESSION: Mild degenerative joint space loss of both hips. No acute hip or pelvis fracture. Electronically Signed   By: David  [place] M.D.   On: 12/30/2016 12:29   Ct Head Wo Contrast  Result Date: 12/30/2016 CLINICAL DATA:  Larey Seat on a patio step and hit his head in the right temporal area today. EXAM: CT HEAD WITHOUT CONTRAST TECHNIQUE: Contiguous axial images were obtained from the base of the skull through the vertex without intravenous contrast. COMPARISON:  None. FINDINGS: Brain: Diffusely enlarged ventricles and subarachnoid spaces. Patchy white  matter low density in both cerebral hemispheres. 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 Old bilateral cerebellar hemisphere infarcts. Small amount of blood in the posterior sylvian fissure on the right. No intraventricular or extra-axial blood. No mass effect. Vascular: No hyperdense vessel or unexpected calcification. Skull: Normal. Negative for fracture or focal lesion. Sinuses/Orbits: Unremarkable. Other: Small right lateral scalp hematoma. IMPRESSION: 1. Small amount of subarachnoid hemorrhage in the sylvian fissure on the right. 2. Moderate to marked diffuse cerebral and cerebellar atrophy and chronic small vessel white matter ischemic changes in both cerebral hemispheres. 3. 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 Critical Value/emergent results were called by telephone at the time of interpretation on 12/30/2016 at 12:47 pm to [facility] who verbally acknowledged these results. Electronically Signed   By: Beckie Salts M.D.   On: 12/30/2016 12:49   Dg Femur Min 2 Views Right  Result Date: 12/30/2016 CLINICAL DATA:  Status post fall at home 3 days ago with persistent proximal to mid right thigh pain. EXAM: RIGHT FEMUR 2 VIEWS COMPARISON:  AP pelvis of today's date FINDINGS: The femur is subjectively osteopenic. There is no acute fracture. There is mild narrowing of the right hip joint space and of the medial, lateral, and patellofemoral compartments of the knee. There is no lytic or blastic lesion or periosteal reaction. There is dense calcification in the wall of the superficial femoral artery as well as the popliteal artery. IMPRESSION: No acute  bony abnormality of the right femur is observed. There are mild degenerative changes of the right hip and mild-to-moderate tricompartmental degenerative change of the right knee. Electronically Signed   By: David  [place] M.D.   On: 12/30/2016 12:30     CBC  Recent Labs Lab 12/30/16 1344 12/31/16 0349  WBC 11.2* 8.9  HGB 11.3*  10.4*  HCT 34.2* 31.5*  PLT 157 133*  MCV 88.4 88.7  MCH 29.2 29.3  MCHC 33.0 33.0  RDW 14.7 14.7    Chemistries   Recent Labs Lab 12/30/16 1400 12/31/16 0349  NA 136 137  K 4.6 3.9  CL 102 105  CO2 27 21*  GLUCOSE 154* 138*  BUN 28* 28*  CREATININE 1.09 1.00  CALCIUM 9.3 8.4*   ------------------------------------------------------------------------------------------------------------------ CrCl cannot be calculated (Unknown ideal weight.). ------------------------------------------------------------------------------------------------------------------ No results for input(s): HGBA1C in the last 72 hours. ------------------------------------------------------------------------------------------------------------------ No results for input(s): CHOL, HDL, LDLCALC, TRIG, CHOLHDL, LDLDIRECT in the last 72 hours. ------------------------------------------------------------------------------------------------------------------  Recent Labs  12/30/16 1639  TSH 1.105   ------------------------------------------------------------------------------------------------------------------ No results for input(s): VITAMINB12, FOLATE, FERRITIN, TIBC, IRON, RETICCTPCT in the last 72 hours.  Coagulation profile  Recent Labs Lab 12/30/16 1344 12/30/16 1639 12/30/16 2256  INR 2.66 1.27 1.31    No results for input(s): DDIMER in the last 72 hours.  Cardiac Enzymes No results for input(s): CKMB, TROPONINI, MYOGLOBIN in the last 168 hours.  Invalid input(s): CK ------------------------------------------------------------------------------------------------------------------ Invalid input(s): POCBNP   CBG:  Recent Labs Lab 12/30/16 1655 12/30/16 2137 12/31/16 0634  GLUCAP 149* 145* 141*       Studies: Dg Pelvis 1-2 Views  Result Date: 12/30/2016 CLINICAL DATA:  Status post fall on patio at home Saturday. The patient reports proximal to mid right thigh pain. EXAM:  PELVIS - 1-2 VIEW COMPARISON:  None in PACs FINDINGS: The bones are subjectively osteopenic. The pelvis exhibits no acute fracture or lytic or blastic lesion. There is mild narrowing of both hip joints consistent with osteoarthritis. The observed portions of the femoral heads, necks, and intertrochanteric regions are normal. There is dense calcification in the external iliac arteries and femoral arteries. IMPRESSION: Mild degenerative joint space loss of both hips. No acute hip or pelvis fracture. Electronically Signed   By: David  [place] M.D.   On: 12/30/2016 12:29   Ct Head Wo Contrast  Result Date: 12/30/2016 CLINICAL DATA:  Larey Seat on a patio step and hit his head in the right temporal area today. EXAM: CT HEAD WITHOUT CONTRAST TECHNIQUE: Contiguous axial images were obtained from the base of the skull through the vertex without intravenous contrast. COMPARISON:  None. FINDINGS: Brain: Diffusely enlarged ventricles and subarachnoid spaces. Patchy white matter low density in both cerebral hemispheres. 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 Old bilateral cerebellar hemisphere infarcts. Small amount of blood in the posterior sylvian fissure on the right. No intraventricular or extra-axial blood. No mass effect. Vascular: No hyperdense vessel or unexpected calcification. Skull: Normal. Negative for fracture or focal lesion. Sinuses/Orbits: Unremarkable. Other: Small right lateral scalp hematoma. IMPRESSION: 1. Small amount of subarachnoid hemorrhage in the sylvian fissure on the right. 2. Moderate to marked diffuse cerebral and cerebellar atrophy and chronic small vessel white matter ischemic changes in both cerebral hemispheres. 3. 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 Critical Value/emergent results were called by telephone at the time of interpretation on 12/30/2016 at 12:47 pm to [facility] who verbally acknowledged these results. Electronically Signed   By: Zada Finders.D.  On:  12/30/2016 12:49   Dg Femur Min 2 Views Right  Result Date: 12/30/2016 CLINICAL DATA:  Status post fall at home 3 days ago with persistent proximal to mid right thigh pain. EXAM: RIGHT FEMUR 2 VIEWS COMPARISON:  AP pelvis of today's date FINDINGS: The femur is subjectively osteopenic. There is no acute fracture. There is mild narrowing of the right hip joint space and of the medial, lateral, and patellofemoral compartments of the knee. There is no lytic or blastic lesion or periosteal reaction. There is dense calcification in the wall of the superficial femoral artery as well as the popliteal artery. IMPRESSION: No acute bony abnormality of the right femur is observed. There are mild degenerative changes of the right hip and mild-to-moderate tricompartmental degenerative change of the right knee. Electronically Signed   By: David  [place] M.D.   On: 12/30/2016 12:30      Lab Results  Component Value Date   HGBA1C 6.7 (H) 08/12/2016   HGBA1C 6.8 (H) 02/04/2016   HGBA1C 6.8 (H) 08/06/2015   Lab Results  Component Value Date   LDLCALC 84 02/28/2009   CREATININE 1.00 12/31/2016       Scheduled Meds: . atorvastatin  20 mg Oral q1800  . calcium-vitamin D  1 tablet Oral Q breakfast  . furosemide  40 mg Oral Daily  . insulin aspart  0-15 Units Subcutaneous TID WC  . insulin aspart  0-5 Units Subcutaneous QHS  . irbesartan  300 mg Oral Daily  . levETIRAcetam  500 mg Oral BID  . levothyroxine  100 mcg Oral QAC breakfast  . metoprolol tartrate  75 mg Oral BID  . potassium chloride SA  20 mEq Oral Daily  . sodium chloride flush  3 mL Intravenous Q12H   Continuous Infusions:   LOS: 0 days    Time spent: >30 MINS    Richarda Overlie  [facility] Pager [phone]. If 7PM-7AM, please contact night-coverage at [email or web address], password [facility] 12/31/2016, 11:13 AM  LOS: 0 days

## 2016-12-31 NOTE — Evaluation (Signed)
Physical Therapy Evaluation Patient Details Name: [name] MRN: [id number] DOB: May 20, 1932 Today's Date: 12/31/2016   History of Present Illness  Pt is an 83 y/o male admitted from home after sustaining a mechanical fall three days prior to admission. Pt found to have a subarachnoid hemorrhage. PMH including but not limited to a-fib, CHF, COPD, CKD, DM, HTN, HLD, s/p AVR (1992) and s/p CABG (2006).  Clinical Impression  Pt presented supine in bed with HOB elevated, awake and willing to participate in therapy session. Prior to admission, pt reported that he was mod I with use of SPC to ambulate and independent with ADLs. Pt's spouse was present throughout session as well. Pt ambulated in the hallway with RW demonstrating modest instability requiring constant min A. Pt would continue to benefit from skilled physical therapy services at this time while admitted and after d/c to address the below listed limitations in order to improve overall safety and independence with functional mobility.     Follow Up Recommendations Home health PT;Supervision/Assistance - 24 hour    Equipment Recommendations  None recommended by PT (pt has all necessary DME at home)    Recommendations for Other Services       Precautions / Restrictions Precautions Precautions: Fall Precaution Comments: pt reported several falls in the past 6 months Restrictions Weight Bearing Restrictions: No      Mobility  Bed Mobility               General bed mobility comments: pt sitting OOB in recliner chair when therapist entered room  Transfers Overall transfer level: Needs assistance Equipment used: Rolling walker (2 wheeled) Transfers: Sit to/from Stand Sit to Stand: Min assist         General transfer comment: increased time, min A for stability and vc'ing for bilateral hand placement. pt performed sit<> stand from recliner chair x2  Ambulation/Gait Ambulation/Gait assistance: Min assist Ambulation  Distance (Feet): 100 Feet Assistive device: Rolling walker (2 wheeled) Gait Pattern/deviations: Step-through pattern;Decreased step length - right;Decreased step length - left;Decreased stride length;Shuffle;Drifts right/left;Trunk flexed Gait velocity: decreased Gait velocity interpretation: Below normal speed for age/gender General Gait Details: modest instability with ambulation using RW requiring constant min A for balance and for assistance with safe use of RW. pt also required frequent cueing for improved posture and forward visual gaze  Stairs            Wheelchair Mobility    Modified Rankin (Stroke Patients Only)       Balance Overall balance assessment: Needs assistance;History of Falls Sitting-balance support: Feet supported Sitting balance-Leahy Scale: Fair     Standing balance support: During functional activity;Bilateral upper extremity supported Standing balance-Leahy Scale: Poor Standing balance comment: pt reliant on bilateral UEs on RW                             Pertinent Vitals/Pain Pain Assessment: No/denies pain    Home Living Family/patient expects to be discharged to:: Private residence Living Arrangements: Spouse/significant other Available Help at Discharge: Family;Available 24 hours/day Type of Home: House Home Access: Stairs to enter Entrance Stairs-Rails: Right Entrance Stairs-Number of Steps: 3 Home Layout: One level Home Equipment: Walker - 2 wheels;Cane - single point;Bedside commode;Shower seat;Grab bars - tub/shower      Prior Function Level of Independence: Independent with assistive device(s)         Comments: pt reported that prior to falling he ambulated with use of  SPC and was independent with ADLs     Hand Dominance   Dominant Hand: Right    Extremity/Trunk Assessment   Upper Extremity Assessment Upper Extremity Assessment: Generalized weakness    Lower Extremity Assessment Lower Extremity  Assessment: Generalized weakness       Communication   Communication: No difficulties  Cognition Arousal/Alertness: Awake/alert Behavior During Therapy: WFL for tasks assessed/performed Overall Cognitive Status: Impaired/Different from baseline Area of Impairment: Memory;Following commands;Safety/judgement;Problem solving;Attention                   Current Attention Level: Focused Memory: Decreased short-term memory Following Commands: Follows one step commands with increased time Safety/Judgement: Decreased awareness of safety;Decreased awareness of deficits   Problem Solving: Slow processing;Decreased initiation;Requires verbal cues;Requires tactile cues        General Comments      Exercises     Assessment/Plan    PT Assessment Patient needs continued PT services  PT Problem List Decreased strength;Decreased balance;Decreased mobility;Decreased coordination;Decreased cognition;Decreased knowledge of use of DME;Decreased safety awareness       PT Treatment Interventions DME instruction;Gait training;Stair training;Functional mobility training;Therapeutic exercise;Therapeutic activities;Balance training;Neuromuscular re-education;Cognitive remediation;Patient/family education    PT Goals (Current goals can be found in the Care Plan section)  Acute Rehab PT Goals Patient Stated Goal: return home PT Goal Formulation: With patient/family Time For Goal Achievement: 01/14/17 Potential to Achieve Goals: Good    Frequency Min 3X/week   Barriers to discharge        Co-evaluation               AM-PAC PT "6 Clicks" Daily Activity  Outcome Measure Difficulty turning over in bed (including adjusting bedclothes, sheets and blankets)?: A Little Difficulty moving from lying on back to sitting on the side of the bed? : A Lot Difficulty sitting down on and standing up from a chair with arms (e.g., wheelchair, bedside commode, etc,.)?: Total Help needed moving to  and from a bed to chair (including a wheelchair)?: A Little Help needed walking in hospital room?: A Little Help needed climbing 3-5 steps with a railing? : A Little 6 Click Score: 15    End of Session Equipment Utilized During Treatment: Gait belt Activity Tolerance: Patient tolerated treatment well Patient left: in chair;with call bell/phone within reach;with chair alarm set;with family/visitor present Nurse Communication: Mobility status PT Visit Diagnosis: Unsteadiness on feet (R26.81);Other abnormalities of gait and mobility (R26.89)    Time: 0981-1914 PT Time Calculation (min) (ACUTE ONLY): 23 min   Charges:   PT Evaluation $PT Eval Moderate Complexity: 1 Procedure PT Treatments $Gait Training: 8-22 mins   PT G Codes:   PT G-Codes **NOT FOR INPATIENT CLASS** Functional Assessment Tool Used: AM-PAC 6 Clicks Basic Mobility;Clinical judgement Functional Limitation: Mobility: Walking and moving around Mobility: Walking and Moving Around Current Status ([id number]): At least 20 percent but less than 40 percent impaired, limited or restricted Mobility: Walking and Moving Around Goal Status [phone]): 0 percent impaired, limited or restricted    [facility], PT, DPT [phone]   Alessandra Bevels Zoi Devine 12/31/2016, 10:39 AM

## 2017-01-01 ENCOUNTER — Encounter (HOSPITAL_COMMUNITY): Payer: Self-pay | Admitting: General Practice

## 2017-01-01 DIAGNOSIS — I130 Hypertensive heart and chronic kidney disease with heart failure and stage 1 through stage 4 chronic kidney disease, or unspecified chronic kidney disease: Secondary | ICD-10-CM | POA: Diagnosis not present

## 2017-01-01 DIAGNOSIS — E118 Type 2 diabetes mellitus with unspecified complications: Secondary | ICD-10-CM | POA: Diagnosis not present

## 2017-01-01 DIAGNOSIS — N183 Chronic kidney disease, stage 3 (moderate): Secondary | ICD-10-CM | POA: Diagnosis not present

## 2017-01-01 DIAGNOSIS — S066X0D Traumatic subarachnoid hemorrhage without loss of consciousness, subsequent encounter: Secondary | ICD-10-CM | POA: Diagnosis not present

## 2017-01-01 DIAGNOSIS — I5022 Chronic systolic (congestive) heart failure: Secondary | ICD-10-CM | POA: Diagnosis not present

## 2017-01-01 DIAGNOSIS — J690 Pneumonitis due to inhalation of food and vomit: Secondary | ICD-10-CM | POA: Diagnosis not present

## 2017-01-01 DIAGNOSIS — J449 Chronic obstructive pulmonary disease, unspecified: Secondary | ICD-10-CM | POA: Diagnosis not present

## 2017-01-01 DIAGNOSIS — G8929 Other chronic pain: Secondary | ICD-10-CM | POA: Diagnosis not present

## 2017-01-01 DIAGNOSIS — R05 Cough: Secondary | ICD-10-CM | POA: Diagnosis not present

## 2017-01-01 DIAGNOSIS — I4891 Unspecified atrial fibrillation: Secondary | ICD-10-CM | POA: Diagnosis not present

## 2017-01-01 DIAGNOSIS — R0689 Other abnormalities of breathing: Secondary | ICD-10-CM | POA: Diagnosis not present

## 2017-01-01 DIAGNOSIS — M5440 Lumbago with sciatica, unspecified side: Secondary | ICD-10-CM | POA: Diagnosis not present

## 2017-01-01 DIAGNOSIS — I609 Nontraumatic subarachnoid hemorrhage, unspecified: Secondary | ICD-10-CM | POA: Diagnosis not present

## 2017-01-01 DIAGNOSIS — N182 Chronic kidney disease, stage 2 (mild): Secondary | ICD-10-CM | POA: Diagnosis not present

## 2017-01-01 DIAGNOSIS — E119 Type 2 diabetes mellitus without complications: Secondary | ICD-10-CM | POA: Diagnosis not present

## 2017-01-01 DIAGNOSIS — M549 Dorsalgia, unspecified: Secondary | ICD-10-CM | POA: Diagnosis not present

## 2017-01-01 DIAGNOSIS — R1310 Dysphagia, unspecified: Secondary | ICD-10-CM | POA: Diagnosis not present

## 2017-01-01 DIAGNOSIS — R0989 Other specified symptoms and signs involving the circulatory and respiratory systems: Secondary | ICD-10-CM | POA: Diagnosis not present

## 2017-01-01 DIAGNOSIS — F329 Major depressive disorder, single episode, unspecified: Secondary | ICD-10-CM | POA: Diagnosis not present

## 2017-01-01 DIAGNOSIS — E785 Hyperlipidemia, unspecified: Secondary | ICD-10-CM | POA: Diagnosis not present

## 2017-01-01 DIAGNOSIS — I10 Essential (primary) hypertension: Secondary | ICD-10-CM | POA: Diagnosis not present

## 2017-01-01 DIAGNOSIS — M1611 Unilateral primary osteoarthritis, right hip: Secondary | ICD-10-CM | POA: Diagnosis not present

## 2017-01-01 DIAGNOSIS — M6281 Muscle weakness (generalized): Secondary | ICD-10-CM | POA: Diagnosis not present

## 2017-01-01 DIAGNOSIS — I5032 Chronic diastolic (congestive) heart failure: Secondary | ICD-10-CM | POA: Diagnosis not present

## 2017-01-01 DIAGNOSIS — R0602 Shortness of breath: Secondary | ICD-10-CM | POA: Diagnosis not present

## 2017-01-01 DIAGNOSIS — R262 Difficulty in walking, not elsewhere classified: Secondary | ICD-10-CM | POA: Diagnosis not present

## 2017-01-01 DIAGNOSIS — R1312 Dysphagia, oropharyngeal phase: Secondary | ICD-10-CM | POA: Diagnosis not present

## 2017-01-01 DIAGNOSIS — I2510 Atherosclerotic heart disease of native coronary artery without angina pectoris: Secondary | ICD-10-CM | POA: Diagnosis not present

## 2017-01-01 DIAGNOSIS — R488 Other symbolic dysfunctions: Secondary | ICD-10-CM | POA: Diagnosis not present

## 2017-01-01 DIAGNOSIS — E039 Hypothyroidism, unspecified: Secondary | ICD-10-CM | POA: Diagnosis not present

## 2017-01-01 DIAGNOSIS — I6200 Nontraumatic subdural hemorrhage, unspecified: Secondary | ICD-10-CM | POA: Diagnosis not present

## 2017-01-01 DIAGNOSIS — E1122 Type 2 diabetes mellitus with diabetic chronic kidney disease: Secondary | ICD-10-CM | POA: Diagnosis not present

## 2017-01-01 LAB — COMPREHENSIVE METABOLIC PANEL
ALK PHOS: 58 U/L (ref 38–126)
ALT: 12 U/L — ABNORMAL LOW (ref 17–63)
AST: 18 U/L (ref 15–41)
Albumin: 2.7 g/dL — ABNORMAL LOW (ref 3.5–5.0)
Anion gap: 8 (ref 5–15)
BILIRUBIN TOTAL: 1.4 mg/dL — AB (ref 0.3–1.2)
BUN: 21 mg/dL — AB (ref 6–20)
CALCIUM: 8.3 mg/dL — AB (ref 8.9–10.3)
CO2: 23 mmol/L (ref 22–32)
Chloride: 104 mmol/L (ref 101–111)
Creatinine, Ser: 1.07 mg/dL (ref 0.61–1.24)
GFR calc Af Amer: >60 mL/min (ref >60)
Glucose, Bld: 149 mg/dL — ABNORMAL HIGH (ref 65–99)
POTASSIUM: 3.7 mmol/L (ref 3.5–5.1)
Sodium: 135 mmol/L (ref 135–145)
TOTAL PROTEIN: 5.9 g/dL — AB (ref 6.5–8.1)

## 2017-01-01 LAB — GLUCOSE, CAPILLARY
GLUCOSE-CAPILLARY: 134 mg/dL — AB (ref 65–99)
GLUCOSE-CAPILLARY: 116 mg/dL — AB (ref 65–99)
Glucose-Capillary: 126 mg/dL — ABNORMAL HIGH (ref 65–99)

## 2017-01-01 LAB — CBC
HCT: 31.2 % — ABNORMAL LOW (ref 39.0–52.0)
HEMOGLOBIN: 10 g/dL — AB (ref 13.0–17.0)
MCH: 28.5 pg (ref 26.0–34.0)
MCHC: 32.1 g/dL (ref 30.0–36.0)
MCV: 88.9 fL (ref 78.0–100.0)
Platelets: 150 10*3/uL (ref 150–400)
RBC: 3.51 MIL/uL — AB (ref 4.22–5.81)
RDW: 15 % (ref 11.5–15.5)
WBC: 10.3 10*3/uL (ref 4.0–10.5)

## 2017-01-01 LAB — HEMOGLOBIN A1C
Hgb A1c MFr Bld: 6.4 % — ABNORMAL HIGH (ref 4.8–5.6)
MEAN PLASMA GLUCOSE: 137 mg/dL

## 2017-01-01 LAB — PROTIME-INR
INR: 1.25
PROTHROMBIN TIME: 15.8 s — AB (ref 11.4–15.2)

## 2017-01-01 MED ORDER — LEVETIRACETAM 500 MG PO TABS: 500.0000 mg | ORAL_TABLET | Freq: Two times a day (BID) | ORAL | 0 refills | Status: AC

## 2017-01-01 NOTE — Discharge Summary (Signed)
Physician Discharge Summary  [name] MRN: [id number] DOB/AGE: 12/11/1931 83 y.o.  PCP: Marin Olp, MD   Admit date: 12/30/2016 Discharge date: 01/01/2017  Discharge Diagnoses:    Principal Problem:   SAH (subarachnoid hemorrhage) ([place]) Active Problems:   Hypothyroidism   Chronic systolic heart failure (HCC)   CKD (chronic kidney disease), stage II   Back pain, chronic   Automatic implantable cardioverter-defibrillator in situ   Diabetes mellitus type II, uncontrolled (HCC)   CAD (coronary artery disease)   Memory loss   Generalized weakness   S/P AVR (aortic valve replacement) and aortoplasty   Diabetes mellitus with complication ([place])   Fall    Follow-up recommendations Follow-up with PCP in 3-5 days , including all  additional recommended appointments as below Follow-up CBC, CMP in 3-5 days Follow up with neurosurgery for outpatient CT scan        Current Discharge Medication List    START taking these medications   Details  levETIRAcetam (KEPPRA) 500 MG tablet Take 1 tablet (500 mg total) by mouth 2 (two) times daily. Qty: 14 tablet, Refills: 0      CONTINUE these medications which have NOT CHANGED   Details  atorvastatin (LIPITOR) 20 MG tablet TAKE 1 TABLET ONCE DAILY. Qty: 90 tablet, Refills: 0    calcium carbonate (OS-CAL) 600 MG TABS Take 1 tablet (600 mg total) by mouth daily. Qty: 60 tablet    furosemide (LASIX) 40 MG tablet TAKE 1 TABLET ONCE DAILY. Qty: 90 tablet, Refills: 3    irbesartan (AVAPRO) 300 MG tablet TAKE 1 TABLET ONCE DAILY. Qty: 90 tablet, Refills: 3    levothyroxine (SYNTHROID, LEVOTHROID) 100 MCG tablet TAKE 1 TABLET ONCE DAILY BEFORE BREAKFAST. Qty: 90 tablet, Refills: 0    metFORMIN (GLUCOPHAGE) 500 MG tablet TAKE 1 TABLET EACH DAY. Qty: 90 tablet, Refills: 3    metoprolol (LOPRESSOR) 50 MG tablet TAKE 1&1/2 TABLETS TWICE DAILY. Qty: 270 tablet, Refills: 3    potassium chloride SA (K-DUR,KLOR-CON) 20 MEQ tablet  TAKE 1 TABLET ONCE DAILY. Qty: 90 tablet, Refills: 3      STOP taking these medications     aspirin 81 MG tablet      warfarin (COUMADIN) 2 MG tablet          Discharge Condition: *Stable *  Discharge Instructions Get Medicines reviewed and adjusted: Please take all your medications with you for your next visit with your Primary MD  Please request your Primary MD to go over all hospital tests and procedure/radiological results at the follow up, please ask your Primary MD to get all Hospital records sent to his/her office.  If you experience worsening of your admission symptoms, develop shortness of breath, life threatening emergency, suicidal or homicidal thoughts you must seek medical attention immediately by calling 911 or calling your MD immediately if symptoms less severe.  You must read complete instructions/literature along with all the possible adverse reactions/side effects for all the Medicines you take and that have been prescribed to you. Take any new Medicines after you have completely understood and accpet all the possible adverse reactions/side effects.   Do not drive when taking Pain medications.   Do not take more than prescribed Pain, Sleep and Anxiety Medications  Special Instructions: If you have smoked or chewed Tobacco in the last 2 yrs please stop smoking, stop any regular Alcohol and or any Recreational drug use.  Wear Seat belts while driving.  Please note  You were cared for  by a hospitalist during your hospital stay. Once you are discharged, your primary care physician will handle any further medical issues. Please note that NO REFILLS for any discharge medications will be authorized once you are discharged, as it is imperative that you return to your primary care physician (or establish a relationship with a primary care physician if you do not have one) for your aftercare needs so that they can reassess your need for medications and monitor your lab  values.  Discharge Instructions    Diet - low sodium heart healthy    Complete by:  As directed    Increase activity slowly    Complete by:  As directed        Allergies  Allergen Reactions  . Amoxicillin     REACTION: unspecified UNKNOWN  . Ezetimibe-Simvastatin     REACTION: unspecified UNKNOWN  . Penicillins Nausea Only  . Sulfamethoxazole-Trimethoprim     REACTION: nausea      Disposition:  SNF   Consults:  Neurosurgery    Significant Diagnostic Studies:  Dg Pelvis 1-2 Views  Result Date: 12/30/2016 CLINICAL DATA:  Status post fall on patio at home Saturday. The patient reports proximal to mid right thigh pain. EXAM: PELVIS - 1-2 VIEW COMPARISON:  None in PACs FINDINGS: The bones are subjectively osteopenic. The pelvis exhibits no acute fracture or lytic or blastic lesion. There is mild narrowing of both hip joints consistent with osteoarthritis. The observed portions of the femoral heads, necks, and intertrochanteric regions are normal. There is dense calcification in the external iliac arteries and femoral arteries. IMPRESSION: Mild degenerative joint space loss of both hips. No acute hip or pelvis fracture. Electronically Signed   By: David  Martinique M.D.   On: 12/30/2016 12:29   Ct Head Wo Contrast  Result Date: 12/30/2016 CLINICAL DATA:  [place] on a patio step and hit his head in the right temporal area today. EXAM: CT HEAD WITHOUT CONTRAST TECHNIQUE: Contiguous axial images were obtained from the base of the skull through the vertex without intravenous contrast. COMPARISON:  None. FINDINGS: Brain: Diffusely enlarged ventricles and subarachnoid spaces. Patchy white matter low density in both cerebral hemispheres. 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 Old bilateral cerebellar hemisphere infarcts. Small amount of blood in the posterior sylvian fissure on the right. No intraventricular or extra-axial blood. No mass effect. Vascular: No hyperdense vessel or unexpected  calcification. Skull: Normal. Negative for fracture or focal lesion. Sinuses/Orbits: Unremarkable. Other: Small right lateral scalp hematoma. IMPRESSION: 1. Small amount of subarachnoid hemorrhage in the sylvian fissure on the right. 2. Moderate to marked diffuse cerebral and cerebellar atrophy and chronic small vessel white matter ischemic changes in both cerebral hemispheres. 3. 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 Critical Value/emergent results were called by telephone at the time of interpretation on 12/30/2016 at 12:47 pm to [facility] who verbally acknowledged these results. Electronically Signed   By: Claudie Revering M.D.   On: 12/30/2016 12:49   Dg Femur Min 2 Views Right  Result Date: 12/30/2016 CLINICAL DATA:  Status post fall at home 3 days ago with persistent proximal to mid right thigh pain. EXAM: RIGHT FEMUR 2 VIEWS COMPARISON:  AP pelvis of today's date FINDINGS: The femur is subjectively osteopenic. There is no acute fracture. There is mild narrowing of the right hip joint space and of the medial, lateral, and patellofemoral compartments of the knee. There is no lytic or blastic lesion or periosteal reaction. There is dense calcification  in the wall of the superficial femoral artery as well as the popliteal artery. IMPRESSION: No acute bony abnormality of the right femur is observed. There are mild degenerative changes of the right hip and mild-to-moderate tricompartmental degenerative change of the right knee. Electronically Signed   By: David  Martinique M.D.   On: 12/30/2016 12:30        Labs: Results for orders placed or performed during the hospital encounter of 12/30/16 (from the past 48 hour(s))  Protime-INR     Status: Abnormal   Collection Time: 12/30/16  1:44 PM  Result Value Ref Range   Prothrombin Time 28.9 (H) 11.4 - 15.2 seconds   INR 2.66   CBC     Status: Abnormal   Collection Time: 12/30/16  1:44 PM  Result Value Ref Range   WBC 11.2 (H)  4.0 - 10.5 K/uL   RBC 3.87 (L) 4.22 - 5.81 MIL/uL   Hemoglobin 11.3 (L) 13.0 - 17.0 g/dL   HCT 34.2 (L) 39.0 - 52.0 %   MCV 88.4 78.0 - 100.0 fL   MCH 29.2 26.0 - 34.0 pg   MCHC 33.0 30.0 - 36.0 g/dL   RDW 14.7 11.5 - 15.5 %   Platelets 157 150 - 400 K/uL  Basic metabolic panel     Status: Abnormal   Collection Time: 12/30/16  2:00 PM  Result Value Ref Range   Sodium 136 135 - 145 mmol/L   Potassium 4.6 3.5 - 5.1 mmol/L   Chloride 102 101 - 111 mmol/L   CO2 27 22 - 32 mmol/L   Glucose, Bld 154 (H) 65 - 99 mg/dL   BUN 28 (H) 6 - 20 mg/dL   Creatinine, Ser 1.09 0.61 - 1.24 mg/dL   Calcium 9.3 8.9 - 10.3 mg/dL   GFR calc non Af Amer >60 >60 mL/min   GFR calc Af Amer >60 >60 mL/min    Comment: (NOTE) The eGFR has been calculated using the CKD EPI equation. This calculation has not been validated in all clinical situations. eGFR's persistently <60 mL/min signify possible Chronic Kidney Disease.    Anion gap 7 5 - 15  Protime-INR     Status: Abnormal   Collection Time: 12/30/16  4:39 PM  Result Value Ref Range   Prothrombin Time 15.9 (H) 11.4 - 15.2 seconds   INR 1.27   TSH     Status: None   Collection Time: 12/30/16  4:39 PM  Result Value Ref Range   TSH 1.105 0.350 - 4.500 uIU/mL    Comment: Performed by a 3rd Generation assay with a functional sensitivity of <=0.01 uIU/mL.  Glucose, capillary     Status: Abnormal   Collection Time: 12/30/16  4:55 PM  Result Value Ref Range   Glucose-Capillary 149 (H) 65 - 99 mg/dL  Glucose, capillary     Status: Abnormal   Collection Time: 12/30/16  9:37 PM  Result Value Ref Range   Glucose-Capillary 145 (H) 65 - 99 mg/dL   Comment 1 Notify RN    Comment 2 Document in Chart   Protime-INR     Status: Abnormal   Collection Time: 12/30/16 10:56 PM  Result Value Ref Range   Prothrombin Time 16.3 (H) 11.4 - 15.2 seconds   INR 4.81   Basic metabolic panel     Status: Abnormal   Collection Time: 12/31/16  3:49 AM  Result Value Ref Range    Sodium 137 135 - 145 mmol/L   Potassium 3.9 3.5 -  5.1 mmol/L   Chloride 105 101 - 111 mmol/L   CO2 21 (L) 22 - 32 mmol/L   Glucose, Bld 138 (H) 65 - 99 mg/dL   BUN 28 (H) 6 - 20 mg/dL   Creatinine, Ser 1.00 0.61 - 1.24 mg/dL   Calcium 8.4 (L) 8.9 - 10.3 mg/dL   GFR calc non Af Amer >60 >60 mL/min   GFR calc Af Amer >60 >60 mL/min    Comment: (NOTE) The eGFR has been calculated using the CKD EPI equation. This calculation has not been validated in all clinical situations. eGFR's persistently <60 mL/min signify possible Chronic Kidney Disease.    Anion gap 11 5 - 15  CBC     Status: Abnormal   Collection Time: 12/31/16  3:49 AM  Result Value Ref Range   WBC 8.9 4.0 - 10.5 K/uL   RBC 3.55 (L) 4.22 - 5.81 MIL/uL   Hemoglobin 10.4 (L) 13.0 - 17.0 g/dL   HCT 31.5 (L) 39.0 - 52.0 %   MCV 88.7 78.0 - 100.0 fL   MCH 29.3 26.0 - 34.0 pg   MCHC 33.0 30.0 - 36.0 g/dL   RDW 14.7 11.5 - 15.5 %   Platelets 133 (L) 150 - 400 K/uL  Glucose, capillary     Status: Abnormal   Collection Time: 12/31/16  6:34 AM  Result Value Ref Range   Glucose-Capillary 141 (H) 65 - 99 mg/dL   Comment 1 Notify RN    Comment 2 Document in Chart   Protime-INR     Status: None   Collection Time: 12/31/16 10:56 AM  Result Value Ref Range   Prothrombin Time 14.9 11.4 - 15.2 seconds   INR 1.16   Hemoglobin A1c     Status: Abnormal   Collection Time: 12/31/16 11:00 AM  Result Value Ref Range   Hgb A1c MFr Bld 6.4 (H) 4.8 - 5.6 %    Comment: (NOTE)         Pre-diabetes: 5.7 - 6.4         Diabetes: >6.4         Glycemic control for adults with diabetes: <7.0    Mean Plasma Glucose 137 mg/dL    Comment: (NOTE) Performed At: [facility] [place], [place] [id number] Lindon Romp MD PZ:[id number]   Glucose, capillary     Status: Abnormal   Collection Time: 12/31/16 11:01 AM  Result Value Ref Range   Glucose-Capillary 135 (H) 65 - 99 mg/dL  Glucose, capillary     Status:  Abnormal   Collection Time: 12/31/16  4:17 PM  Result Value Ref Range   Glucose-Capillary 153 (H) 65 - 99 mg/dL  Glucose, capillary     Status: Abnormal   Collection Time: 12/31/16  8:49 PM  Result Value Ref Range   Glucose-Capillary 158 (H) 65 - 99 mg/dL  Protime-INR     Status: Abnormal   Collection Time: 01/01/17  3:01 AM  Result Value Ref Range   Prothrombin Time 15.8 (H) 11.4 - 15.2 seconds   INR 1.25   Comprehensive metabolic panel     Status: Abnormal   Collection Time: 01/01/17  3:01 AM  Result Value Ref Range   Sodium 135 135 - 145 mmol/L   Potassium 3.7 3.5 - 5.1 mmol/L   Chloride 104 101 - 111 mmol/L   CO2 23 22 - 32 mmol/L   Glucose, Bld 149 (H) 65 - 99 mg/dL   BUN 21 (H) 6 -  20 mg/dL   Creatinine, Ser 1.07 0.61 - 1.24 mg/dL   Calcium 8.3 (L) 8.9 - 10.3 mg/dL   Total Protein 5.9 (L) 6.5 - 8.1 g/dL   Albumin 2.7 (L) 3.5 - 5.0 g/dL   AST 18 15 - 41 U/L   ALT 12 (L) 17 - 63 U/L   Alkaline Phosphatase 58 38 - 126 U/L   Total Bilirubin 1.4 (H) 0.3 - 1.2 mg/dL   GFR calc non Af Amer >60 >60 mL/min   GFR calc Af Amer >60 >60 mL/min    Comment: (NOTE) The eGFR has been calculated using the CKD EPI equation. This calculation has not been validated in all clinical situations. eGFR's persistently <60 mL/min signify possible Chronic Kidney Disease.    Anion gap 8 5 - 15  CBC     Status: Abnormal   Collection Time: 01/01/17  3:01 AM  Result Value Ref Range   WBC 10.3 4.0 - 10.5 K/uL   RBC 3.51 (L) 4.22 - 5.81 MIL/uL   Hemoglobin 10.0 (L) 13.0 - 17.0 g/dL   HCT 31.2 (L) 39.0 - 52.0 %   MCV 88.9 78.0 - 100.0 fL   MCH 28.5 26.0 - 34.0 pg   MCHC 32.1 30.0 - 36.0 g/dL   RDW 15.0 11.5 - 15.5 %   Platelets 150 150 - 400 K/uL  Glucose, capillary     Status: Abnormal   Collection Time: 01/01/17  6:16 AM  Result Value Ref Range   Glucose-Capillary 134 (H) 65 - 99 mg/dL   Comment 1 Notify RN    Comment 2 Document in Chart   Glucose, capillary     Status: Abnormal    Collection Time: 01/01/17 11:13 AM  Result Value Ref Range   Glucose-Capillary 116 (H) 65 - 99 mg/dL     Lipid Panel     Component Value Date/Time   CHOL 137 10/31/2009 1421   TRIG 119.0 02/28/2009 0904   HDL 27.40 (L) 10/31/2009 1421   CHOLHDL 5 02/28/2009 0904   VLDL 23.8 02/28/2009 0904   LDLCALC 84 02/28/2009 0904   LDLDIRECT 64.0 08/12/2016 1514     Lab Results  Component Value Date   HGBA1C 6.4 (H) 12/31/2016   HGBA1C 6.7 (H) 08/12/2016   HGBA1C 6.8 (H) 02/04/2016       HPI :  84 y.o.malewith medical history significant for A. fib on Coumadin, COPD, chronic systolic heart failure, chronic kidney disease stage II, diabetes, hypertension, presents to emergency Department chief complaint generalized weakness status post fall 3 days ago. Patient had small abrasion with bruising on the R side of his forehead. Swelling and discoloration on the R femur and knee.Initial evaluation includes a CT of the head which reveals a small subarachnoid hemorrhage. Evaluated by neurosurgery who recommends admission for observation.  HOSPITAL COURSE: *  #1. Subarachnoid hemorrhage status post fall and patient is on Coumadin. INR 2.2. Witnessed fall reported to be mechanical 3 days ago. No loss of consciousness patient has little memory of event. Vitamin with vitamin K via reversal protocol.Evaluated by neurology who opined small focal area of [facility] with neurology intact.  Neurosurgery was consulted. No intervention indicated but recommended admission for observation and Keppra for7 days.  Neurochecks stable Coumadin discontinued at the time of discharge -Keppra per neurosurgery for seizure prophylaxis patient needs repeat CT scan in one week and follow-up with neurosurgery   #2. Fall. Reported mechanical. Patient with chronic right knee pain uses a cane at baseline. Complains  of right hip and leg pain. X-ray no acute bony abnormalities but degeneration of the hip. Patient being discharged  to SNF  #3. Generalized weakness. Likely related to 3 days of lying in bed and not taking in nutrients or water. Patient denies abdominal pain or nausea but does report no appetite -Physical therapy -Nutritional consult  #4. Status post aortic valve replacement and aortoplasty. On Coumadin. INR 2.2. -Holding Coumadin per #1 -Resume and once cleared by neurosurgery  #5. Chronic kidney disease. Stage II. Stable at baseline  #6. Chronic systolic heart failure. Compensated -continue homemed  #7. Hypertension. Fair control in the emergency department. Home medications include Lasix, Avapro, metoprolol Continue home medications -Continue Avapro and metoprolol  #8. Diabetes type 2. Serum glucose 154 on admission. Home medications include metformin Resumed metformin for now Hemoglobin A1c 6.4, most recent hemoglobin A1c 6.7, 08/12/16 -Sliding scale insulin for optimal control  #9. CAD. No chest pain. -Hold home low-dose aspirin for now -Continue Lipitor    Discharge Exam:  Blood pressure 140/64, pulse 79, temperature 98.3 F (36.8 C), temperature source Oral, resp. rate 18, SpO2 96 %.  General exam: Appears calm and comfortable  Respiratory system: Clear to auscultation. Respiratory effort normal. Cardiovascular system: S1 & S2 heard, RRR. No JVD, murmurs, rubs, gallops or clicks. No pedal edema. Gastrointestinal system: Abdomen is nondistended, soft and nontender. No organomegaly or masses felt. Normal bowel sounds heard. Central nervous system: Alert and oriented. No focal neurological deficits. Extremities: Symmetric 5 x 5 power. Skin: No rashes, lesions or ulcers Psychiatry: Judgement and insight appear normal. Mood & affect appropriate.      Contact information for follow-up providers    Costella, Vista Mink, PA-C. Call.   Specialty:  Physician Assistant Why:  Called to make follow-up appointment, needs repeat CT scan in one week Contact information: [place] [id number] [phone]            Contact information for after-discharge care    Destination    [facility] Follow up.   Specialty:  [place] information: [address] [place] [phone]                  Signed: Reyne Dumas 01/01/2017, 12:09 PM        Time spent >45 mins

## 2017-01-01 NOTE — Progress Notes (Signed)
Patient is being discharged to [facility]. Discharge package was given to transporter.

## 2017-01-01 NOTE — Clinical Social Work Placement (Addendum)
   CLINICAL SOCIAL WORK PLACEMENT  NOTE 01/01/17 - DISCHARGED TO [facility]BULANCE  Date:  01/01/2017  Patient Details  Name: [name] MRN: [id number] Date of Birth: Feb 02, 1932  Clinical Social Work is seeking post-discharge placement for this patient at the Skilled  Nursing Facility level of care (*CSW will initial, date and re-position this form in  chart as items are completed):  Yes   Patient/family provided with [facility] Clinical Social Work Department's list of facilities offering this level of care within the geographic area requested by the patient (or if unable, by the patient's family).  Yes   Patient/family informed of their freedom to choose among providers that offer the needed level of care, that participate in Medicare, Medicaid or managed care program needed by the patient, have an available bed and are willing to accept the patient.  Yes   Patient/family informed of [facility] ownership interest in [facility] and [facility], as well as of the fact that they are under no obligation to receive care at these facilities.  PASRR submitted to EDS on 12/31/16     PASRR number received on 12/31/16     Existing PASRR number confirmed on       FL2 transmitted to all facilities in geographic area requested by pt/family on       FL2 transmitted to all facilities within larger geographic area on 12/31/16     Patient informed that his/her managed care company has contracts with or will negotiate with certain facilities, including the following:        Yes   Patient/family informed of bed offers received.  Patient chooses bed at  [facility]     Physician recommends and patient chooses bed at      Patient to be transferred to  [facility] on  01/01/17.  Patient to be transferred to facility by  ambulance     Patient family notified on  05/29/17 of transfer.  Name of family member notified:   [name], at the bedside.     PHYSICIAN        Additional Comment:    _______________________________________________ Cristobal Goldmann, LCSW 01/01/2017, 3:41 PM

## 2017-01-01 NOTE — Progress Notes (Signed)
Social worker Genelle Bal was called twice with no response.Message was left on the answer machine.

## 2017-01-01 NOTE — Clinical Social Work Note (Signed)
Clinical Social Work Assessment  Patient Details  Name: [name] MRN: [id number] Date of Birth: [birth year]  Date of referral:  01/01/17               Reason for consult:  Facility Placement, Discharge Planning                Permission sought to share information with:  Facility Medical sales representative, Family Supports Permission granted to share information::  Yes, Verbal Permission Granted  Name::     Management consultant::  SNF  Relationship::  Wife  Contact Information:     Housing/Transportation Living arrangements for the past 2 months:  Single Family Home Source of Information:  Patient, Spouse Patient Interpreter Needed:  None Criminal Activity/Legal Involvement Pertinent to Current Situation/Hospitalization:  No - Comment as needed Significant Relationships:  Spouse Lives with:  Self, Spouse Do you feel safe going back to the place where you live?  Yes Need for family participation in patient care:  No (Coment)  Care giving concerns:  Prior to current admission, pt was residing at home with his wife. Pt's wife is concerned about her ability to care for him on her own with his weakness from his fall.   Social Worker assessment / plan:  CSW explained SNF referral process. Pt's wife expressed her concern for how weak the pt has become since this fall. Pt's wife discussed how he was pretty independent prior to the fall, and has become increasingly dependent on her after the fall, which is hard for her to be able to care for him on her own. Pt's wife discussed preference for [facility] or [facility]. Pt's wife agreed to look into both of them and decide her first choice between the two of them.   Employment status:  Retired Database administrator PT Recommendations:  Skilled Nursing Facility Information / Referral to community resources:     Patient/Family's Response to care:  Pt's wife agreeable to placement in SNF.   Patient/Family's Understanding of and  Emotional Response to Diagnosis, Current Treatment, and Prognosis:  Pt may be confused about what is happening to him; he believes that he is going to die and will not be leaving the hospital. Pt's wife was upset and crying during discussion; she says it's really hard for her to see him like this. Pt's wife appeared to be struggling with her emotional response to the pt's worsening health after the fall, but she was trying to be brave and do what she could to get him the help that he needs to recover. Pt's wife is hopeful that a short rehab stay will improve the pt's strength and allow him to return home.  Emotional Assessment Appearance:  Appears stated age Attitude/Demeanor/Rapport:    Affect (typically observed):  Appropriate Orientation:  Oriented to Self, Oriented to Place, Oriented to  Time, Oriented to Situation Alcohol / Substance use:  Not Applicable Psych involvement (Current and /or in the community):  No (Comment)  Discharge Needs  Concerns to be addressed:  Care Coordination, Discharge Planning Concerns Readmission within the last 30 days:  No Current discharge risk:  Physical Impairment Barriers to Discharge:  Continued Medical Work up   [employer], LCSW 01/01/2017, 10:38 AM

## 2017-01-01 NOTE — Care Management Note (Signed)
Case Management Note  Patient Details  Name: [name] MRN: [id number] Date of Birth: 23-Nov-1931  Subjective/Objective:                    Action/Plan: Pt discharging to [facility] today. No further needs per CM.   Expected Discharge Date:  01/01/17               Expected Discharge Plan:  Skilled Nursing Facility  In-House Referral:  Clinical Social Work  Discharge planning Services  CM Consult  Post Acute Care Choice:    Choice offered to:     DME Arranged:    DME Agency:     HH Arranged:    HH Agency:     Status of Service:  In process, will continue to follow  If discussed at Long Length of Stay Meetings, dates discussed:    Additional Comments:  Kermit Balo, RN 01/01/2017, 3:22 PM

## 2017-01-01 NOTE — Progress Notes (Signed)
Physical Therapy Treatment Patient Details Name: [name] MRN: [id number] DOB: Jul 10, 1932 Today's Date: 01/01/2017    History of Present Illness Pt is an 83 y/o male admitted from home after sustaining a mechanical fall three days prior to admission. Pt found to have a subarachnoid hemorrhage. PMH including but not limited to a-fib, CHF, COPD, CKD, DM, HTN, HLD, s/p AVR (1992) and s/p CABG (2006).    PT Comments    Pt seen for mobility progression; however, pt demonstrated greater weakness this session and was not able to ambulate as far with RW. Pt participated in bilateral LE strengthening exercises upon returning to recliner chair. Pt would continue to benefit from skilled physical therapy services at this time while admitted and after d/c to address the below listed limitations in order to improve overall safety and independence with functional mobility.    Follow Up Recommendations  Supervision/Assistance - 24 hour;SNF (pt is d/c'ing to [facility] today)     Equipment Recommendations  None recommended by PT    Recommendations for Other Services       Precautions / Restrictions Precautions Precautions: Fall Precaution Comments: pt reported several falls in the past 6 months Restrictions Weight Bearing Restrictions: No    Mobility  Bed Mobility               General bed mobility comments: pt sitting OOB in recliner chair when therapist entered room  Transfers Overall transfer level: Needs assistance Equipment used: Rolling walker (2 wheeled) Transfers: Sit to/from Stand Sit to Stand: Min assist         General transfer comment: increased time, min A for stability and vc'ing for bilateral hand placement. pt performed sit<> stand from recliner chair x2  Ambulation/Gait Ambulation/Gait assistance: Min assist Ambulation Distance (Feet): 20 Feet Assistive device: Rolling walker (2 wheeled) Gait Pattern/deviations: Step-through pattern;Decreased step length -  right;Decreased step length - left;Decreased stride length;Shuffle;Drifts right/left;Trunk flexed Gait velocity: decreased Gait velocity interpretation: Below normal speed for age/gender General Gait Details: modest instability with ambulation using RW requiring constant min A for balance and for assistance with safe use of RW. pt also required frequent cueing for improved posture and forward visual gaze. Limited distance this session secondary to pt reports of knee soreness and weakness   Stairs            Wheelchair Mobility    Modified Rankin (Stroke Patients Only)       Balance Overall balance assessment: Needs assistance;History of Falls Sitting-balance support: Feet supported Sitting balance-Leahy Scale: Fair     Standing balance support: During functional activity;Bilateral upper extremity supported Standing balance-Leahy Scale: Poor Standing balance comment: pt reliant on bilateral UEs on RW                            Cognition Arousal/Alertness: Awake/alert Behavior During Therapy: Flat affect Overall Cognitive Status: Impaired/Different from baseline Area of Impairment: Memory;Following commands;Safety/judgement;Problem solving;Attention                   Current Attention Level: Focused Memory: Decreased short-term memory Following Commands: Follows one step commands with increased time Safety/Judgement: Decreased awareness of safety;Decreased awareness of deficits   Problem Solving: Slow processing;Decreased initiation;Requires verbal cues;Requires tactile cues        Exercises General Exercises - Lower Extremity Ankle Circles/Pumps: AROM;Both;20 reps;Seated Long Arc Quad: AROM;Both;20 reps;Seated Hip Flexion/Marching: AROM;Both;20 reps;Seated Toe Raises: AROM;Both;20 reps;Seated Heel Raises: AROM;Both;20 reps;Seated  General Comments        Pertinent Vitals/Pain Pain Assessment: No/denies pain    Home Living                       Prior Function            PT Goals (current goals can now be found in the care plan section) Acute Rehab PT Goals PT Goal Formulation: With patient/family Time For Goal Achievement: 01/14/17 Potential to Achieve Goals: Good Progress towards PT goals: Progressing toward goals    Frequency    Min 3X/week      PT Plan Current plan remains appropriate    Co-evaluation              AM-PAC PT "6 Clicks" Daily Activity  Outcome Measure  Difficulty turning over in bed (including adjusting bedclothes, sheets and blankets)?: A Little Difficulty moving from lying on back to sitting on the side of the bed? : A Lot Difficulty sitting down on and standing up from a chair with arms (e.g., wheelchair, bedside commode, etc,.)?: Total Help needed moving to and from a bed to chair (including a wheelchair)?: A Little Help needed walking in hospital room?: A Little Help needed climbing 3-5 steps with a railing? : A Lot 6 Click Score: 14    End of Session Equipment Utilized During Treatment: Gait belt Activity Tolerance: Patient tolerated treatment well Patient left: in chair;with call bell/phone within reach;with chair alarm set;with family/visitor present Nurse Communication: Mobility status PT Visit Diagnosis: Unsteadiness on feet (R26.81);Other abnormalities of gait and mobility (R26.89)     Time: 1610-9604 PT Time Calculation (min) (ACUTE ONLY): 14 min  Charges:  $Gait Training: 8-22 mins                    G Codes:       [place], [place], [place] [phone]    [name] 01/01/2017, 4:16 PM

## 2017-01-01 NOTE — Progress Notes (Signed)
Report was called and given to [place] at [facility]. Patient is waiting for transportation at this time.

## 2017-01-02 ENCOUNTER — Telehealth: Payer: Self-pay | Admitting: *Deleted

## 2017-01-02 NOTE — Telephone Encounter (Signed)
Patient discharged 01/01/17; attempted TCM call and spoke to wife Aldo [place] (on [place]) who advised that patient went to [facility] post hospitalization for rehab. Advised wife to ensure patient follows up with PCPpost d/c from SNF. Spouse verbalized understanding.

## 2017-01-06 ENCOUNTER — Non-Acute Institutional Stay (SKILLED_NURSING_FACILITY): Payer: Medicare Other | Admitting: Internal Medicine

## 2017-01-06 ENCOUNTER — Encounter: Payer: Self-pay | Admitting: Internal Medicine

## 2017-01-06 DIAGNOSIS — I609 Nontraumatic subarachnoid hemorrhage, unspecified: Secondary | ICD-10-CM | POA: Diagnosis not present

## 2017-01-06 DIAGNOSIS — I4891 Unspecified atrial fibrillation: Secondary | ICD-10-CM | POA: Diagnosis not present

## 2017-01-06 DIAGNOSIS — E118 Type 2 diabetes mellitus with unspecified complications: Secondary | ICD-10-CM

## 2017-01-06 DIAGNOSIS — I10 Essential (primary) hypertension: Secondary | ICD-10-CM | POA: Diagnosis not present

## 2017-01-06 NOTE — Assessment & Plan Note (Signed)
Warfarin being held until follow-up CT scan 01/06/17 rhythm is regular clinically

## 2017-01-06 NOTE — Assessment & Plan Note (Signed)
BP controlled; no change in antihypertensive medications  

## 2017-01-06 NOTE — Progress Notes (Signed)
MRN: [id number]  Facility Location: [facility]  Room Number: 303  Code Status: Full Code   PCP: Shelva Majestic, MD [address] [place] [address]   This is a comprehensive admission note to [facility] performed on this date less than 30 days from date of admission. Included are preadmission medical/surgical history;reconciled medication list; family history; social history and comprehensive review of systems.  Corrections and additions to the records were documented . Comprehensive physical exam was also performed. Additionally a clinical summary was entered for each active diagnosis pertinent to this admission in the Problem List to enhance continuity of care.   HPI: The patient was hospitalized 5/8-5/10/18 with subarachnoid hemorrhage. The patient presented to the ED with generalized weakness after a fall 3 days prior to admission. A small abrasion and bruising on the right side of the forehead was noted along with swelling and discoloration of the right femur and knee. CT revealed a small subarachnoid hemorrhage and neurosurgery recommended observation admission. The fall was in the context of warfarin therapy for atrial fibrillation. The fall was felt to be mechanical without cardiac or neurologic prodrome. INR was 2.2, vitamin K reversal was initiated. Heparin was initiated for 7 days. Recommendations made for follow-up CT scan partially 5/17. Weakness was attributed to being essentially bedridden for 3 days following the fall with poor dietary intake. The patient's only GI complaint was anorexia. Hemoglobin A1c was prediabetic at 6.4%. Pain scale insulin coverage was initiated while hospitalized. Labs at discharge revealed calcium 8.3, total bilirubin 1.4, hemoglobin 10/hematocrit 31.2. Indices were normochromic, normocytic. INR was 1.25 off warfarin.  Past medical and surgical history: Includes COPD, hypertension, chronic  kidney disease, systolic heart failure, coronary disease, and diabetes. The patient had aVR replacement and aortoplasty; bypass grafting; & ablation facial fibrillation.   Social history:  Family history:  Review of systems:Could not be completed due to dementia. Date given as Constitutional: No fever,significant weight change, fatigue  Eyes: No redness, discharge, pain, vision change ENT/mouth: No nasal congestion,  purulent discharge, earache,change in hearing ,sore throat  Cardiovascular: No chest pain, palpitations,paroxysmal nocturnal dyspnea, claudication, edema  Respiratory: No cough, sputum production,hemoptysis, DOE , significant snoring,apnea  Gastrointestinal: No heartburn,dysphagia,abdominal pain, nausea / vomiting,rectal bleeding, melena,change in bowels Genitourinary: No dysuria,hematuria, pyuria,  incontinence, nocturia Musculoskeletal: No joint stiffness, joint swelling, weakness,pain Dermatologic: No rash, pruritus, change in appearance of skin Neurologic: No dizziness,headache,syncope, seizures, numbness , tingling Psychiatric: No significant anxiety , depression, insomnia, anorexia Endocrine: No change in hair/skin/ nails, excessive thirst, excessive hunger, excessive urination  Hematologic/lymphatic: No significant bruising, lymphadenopathy,abnormal bleeding Allergy/immunology: No itchy/ watery eyes, significant sneezing, urticaria, angioedema  Physical exam:  Pertinent or positive findings: General appearance:Adequately nourished; no acute distress , increased work of breathing is present.   Lymphatic: No lymphadenopathy about the head, neck, axilla . Eyes: No conjunctival inflammation or lid edema is present. There is no scleral icterus. Ears:  External ear exam shows no significant lesions or deformities.   Nose:  External nasal examination shows no deformity or inflammation. Nasal mucosa are pink and moist without lesions ,exudates Oral exam: lips and gums are  healthy appearing.There is no oropharyngeal erythema or exudate . Neck:  No thyromegaly, masses, tenderness noted.    Heart:  Normal rate and regular rhythm. S1 and S2 normal without gallop, murmur, click, rub .  Lungs:Chest clear to auscultation without wheezes, rhonchi,rales , rubs. Abdomen:Bowel sounds are normal. Abdomen is soft and nontender with no  organomegaly, hernias,masses. GU: deferred  Extremities:  No cyanosis, clubbing,edema  Neurologic exam : Strength equal  in upper & lower extremities Balance,Rhomberg,finger to nose testing could not be completed due to clinical state Deep tendon reflexes are equal Skin: Warm & dry w/o tenting. No significant lesions or rash.  See clinical summary under each active problem in the Problem List with associated updated therapeutic plan

## 2017-01-06 NOTE — Assessment & Plan Note (Signed)
Follow-up CT as scheduled

## 2017-01-06 NOTE — Progress Notes (Signed)
MRN: [id number]  Facility Location: [facility]  Room Number: 303  Code Status: Full Code   PCP: Shelva Majestic, MD [address] [place] [address]   This is a comprehensive admission note to [facility] performed on this date less than 30 days from date of admission. Included are preadmission medical/surgical history;reconciled medication list; family history; social history and comprehensive review of systems.  Corrections and additions to the records were documented . Comprehensive physical exam was also performed. Additionally a clinical summary was entered for each active diagnosis pertinent to this admission in the Problem List to enhance continuity of care.   HPI: The patient was hospitalized 5/8-5/10/18 with subarachnoid hemorrhage. The patient presented to the ED with generalized weakness after a fall 3 days prior to admission. A small abrasion and bruising on the right side of the forehead was noted along with swelling and discoloration of the right femur and knee. CT revealed a small subarachnoid hemorrhage and neurosurgery recommended observation admission. The fall was in the context of warfarin therapy for atrial fibrillation. The fall was felt to be mechanical without cardiac or neurologic prodrome. INR was 2.2, vitamin K reversal was initiated. Keppra was initiated for 7 days. Recommendation was made for follow-up CT scan approximately 5/17 before resuming warfarin. Weakness was attributed to being essentially bedridden for 3 days following the fall with poor dietary intake. The patient's only GI complaint was anorexia. Hemoglobin A1c was prediabetic at 6.4%. Sliding scale insulin coverage was initiated while hospitalized. Labs at discharge revealed calcium 8.3, total bilirubin 1.4, hemoglobin 10/hematocrit 31.2. Indices were normochromic, normocytic. INR was 1.25 off warfarin.  Past medical and surgical history: Includes  COPD, hypertension, chronic kidney disease, systolic heart failure, coronary disease, and diabetes. The patient had AV replacement and aortoplasty; bypass grafting; & ablation for atrial fibrillation.  Social history: Quit smoking 1980  Family history: Reviewed  Review of systems: He states that he was falling several years ago and went to physical therapy. He quit falling for one year; but it has now recurred, described as being related to "carelessness". He still denies any cardiac or neurologic prodrome prior to the fall. He's had some discomfort in the right inferior thorax when he "pulls up".  Constitutional: No fever,significant weight change, fatigue  Eyes: No redness, discharge, pain, vision change ENT/mouth: No nasal congestion,  purulent discharge, earache,change in hearing ,sore throat  Cardiovascular: No chest pain except for above, palpitations,paroxysmal nocturnal dyspnea, claudication, edema  Respiratory: No cough, sputum production,hemoptysis, DOE , significant snoring,apnea  Gastrointestinal: No heartburn,dysphagia,abdominal pain, nausea / vomiting,rectal bleeding, melena,change in bowels Genitourinary: No dysuria,hematuria, pyuria,  incontinence, nocturia Musculoskeletal: No joint stiffness, joint swelling, weakness,pain Dermatologic: No rash, pruritus, change in appearance of skin Neurologic: No dizziness,headache,syncope, seizures, numbness , tingling Psychiatric: No significant anxiety , depression, insomnia, anorexia Endocrine: No change in hair/skin/ nails, excessive thirst, excessive hunger, excessive urination  Hematologic/lymphatic: No significant bruising, lymphadenopathy,abnormal bleeding Allergy/immunology: No itchy/ watery eyes, significant sneezing, urticaria, angioedema  Physical exam:  Pertinent or positive findings: He appears slightly lethargic but is essentially oriented. He did give the date as 01/12/2017. President was identified as "the  [employer] Bruising is noted over the right temple, forearms, and left thigh. He has slight ptosis of the right eye with intermittent right exotropia. Heart rhythm is regular with increased second heart sound. Breath sounds are decreased. Abdomen is protuberant. He has 1/2+ edema at the sock line. Pedal pulses are decreased. He has  generalized but essentially equal weakness in the extremities. At rest there is slight asymmetry of the nasolabial folds but this resolves with smile.   General appearance:Adequately nourished; no acute distress , increased work of breathing is present.   Lymphatic: No lymphadenopathy about the head, neck, axilla . Eyes: No conjunctival inflammation or lid edema is present. There is no scleral icterus. Ears:  External ear exam shows no significant lesions or deformities.   Nose:  External nasal examination shows no deformity or inflammation. Nasal mucosa are pink and moist without lesions ,exudates Oral exam: lips and gums are healthy appearing.There is no oropharyngeal erythema or exudate . Neck:  No thyromegaly, masses, tenderness noted.    Heart:  Normal rate and regular rhythm. S1 normal without gallop, murmur, click, rub .  Lungs:Chest clear to auscultation without wheezes, rhonchi,rales , rubs. Abdomen:Bowel sounds are normal. Abdomen is soft and nontender with no organomegaly, hernias,masses. GU: deferred  Extremities:  No cyanosis, clubbing  Neurologic exam : Balance,Rhomberg,finger to nose testing could not be completed due to clinical state Skin: Warm & dry w/o tenting. No significant lesions or rash.  See clinical summary under each active problem in the Problem List with associated updated therapeutic plan

## 2017-01-06 NOTE — Assessment & Plan Note (Signed)
Current A1c prediabetic at 6.4% Continue metformin unless renal insufficiency progressive

## 2017-01-07 ENCOUNTER — Other Ambulatory Visit (HOSPITAL_COMMUNITY): Payer: Self-pay | Admitting: Internal Medicine

## 2017-01-07 ENCOUNTER — Encounter: Payer: Self-pay | Admitting: Family Medicine

## 2017-01-07 DIAGNOSIS — S066X0A Traumatic subarachnoid hemorrhage without loss of consciousness, initial encounter: Secondary | ICD-10-CM

## 2017-01-07 NOTE — Patient Instructions (Signed)
See assessment and plan under each diagnosis in the problem list and acutely for this visit 

## 2017-01-09 ENCOUNTER — Ambulatory Visit (HOSPITAL_COMMUNITY): Payer: Medicare Other

## 2017-01-12 ENCOUNTER — Other Ambulatory Visit: Payer: Self-pay | Admitting: Physician Assistant

## 2017-01-12 ENCOUNTER — Other Ambulatory Visit: Payer: Medicare Other

## 2017-01-12 DIAGNOSIS — I609 Nontraumatic subarachnoid hemorrhage, unspecified: Secondary | ICD-10-CM

## 2017-01-12 DIAGNOSIS — I10 Essential (primary) hypertension: Secondary | ICD-10-CM | POA: Diagnosis not present

## 2017-01-13 ENCOUNTER — Encounter: Payer: Self-pay | Admitting: Internal Medicine

## 2017-01-13 ENCOUNTER — Non-Acute Institutional Stay (SKILLED_NURSING_FACILITY): Payer: Medicare Other | Admitting: Internal Medicine

## 2017-01-13 DIAGNOSIS — F329 Major depressive disorder, single episode, unspecified: Secondary | ICD-10-CM | POA: Diagnosis not present

## 2017-01-13 DIAGNOSIS — R1310 Dysphagia, unspecified: Secondary | ICD-10-CM | POA: Diagnosis not present

## 2017-01-13 DIAGNOSIS — R0689 Other abnormalities of breathing: Secondary | ICD-10-CM

## 2017-01-13 DIAGNOSIS — F32A Depression, unspecified: Secondary | ICD-10-CM

## 2017-01-13 DIAGNOSIS — R0989 Other specified symptoms and signs involving the circulatory and respiratory systems: Secondary | ICD-10-CM

## 2017-01-13 NOTE — Progress Notes (Signed)
    MRN: [id number]  Facility Location: [facility]  Room Number: 303  Code Status: Full Code   This is a nursing facility follow up for specific acute issue of dysphagia and O2 desaturation.  Interim medical record and care since last [facility] visit was updated with review of diagnostic studies and change in clinical status since last visit were documented.  HPI: [employer] NP requested I evaluate him as O2 sats were dropping into high 80% despite 2 l/min.  Actually O2 sats were 84 % in context of low grade  fever and AMS 5/20, but this was addressed as COC report placed in [employer] folder rather than contacting on call personnel. Rehab staff have noted dysphagia & coughing with meal intake and have requested a swallowing evaluation.. This patient minimizes any dysphasia or GI symptoms. He also downplays any choking or cough related to meal intake. His wife states she feels he is depressed and has "given up"  Review of systems:  Constitutional: denies fever,significant weight change, fatigue  Eyes: No redness, discharge, pain, vision change ENT/mouth: No nasal congestion,  purulent discharge, earache,change in hearing ,sore throat  Cardiovascular: No chest pain, palpitations,paroxysmal nocturnal dyspnea, claudication, edema  Respiratory: No significant cough, sputum production,hemoptysis, DOE , significant snoring,apnea   Gastrointestinal: No heartburn,dysphagia,abdominal pain, nausea / vomiting,rectal bleeding, melena,change in bowels Genitourinary: No dysuria,hematuria, pyuria,  incontinence, nocturia Psychiatric: No significant anxiety , depression, insomnia, anorexia Hematologic/lymphatic: No significant bruising, lymphadenopathy,abnormal bleeding Allergy/immunology: No itchy/ watery eyes, significant sneezing, urticaria, angioedema  Physical exam:  Pertinent or positive findings: He appears weak and somewhat chronically ill but he is oriented. He  has coarse asymmetric rhonchi worse with expiration. Relatively the right lung breath sounds are significantly decreased compared to the left. Heart sounds are distant and slightly irregular. Trace edema is present. Pedal pulses are decreased.  His affect is indeed flat.  General appearance:Adequately nourished; no acute distress , increased work of breathing is present.   Lymphatic: No lymphadenopathy about the head, neck, axilla . Eyes: No conjunctival inflammation or lid edema is present. There is no scleral icterus. Ears:  External ear exam shows no significant lesions or deformities.   Nose:  External nasal examination shows no deformity or inflammation. Nasal mucosa are pink and moist without lesions ,exudates Oral exam: lips and gums are healthy appearing.There is no oropharyngeal erythema or exudate . Neck:  No thyromegaly, masses, tenderness noted.    Heart:  No gallop, murmur, click, rub .  Abdomen:Bowel sounds are normal. Abdomen is soft and nontender with no organomegaly, hernias,masses. GU: deferred  Extremities:  No cyanosis, clubbing  Skin: Warm & dry w/o tenting. No significant lesions or rash.  #1 dysphagia  #2 probable aspiration pneumonia on the right #3 depression Plan: Imaging, Rocephin, supplemental steroids, and pulmonary toilet. swallowing study pending  SSRI trial

## 2017-01-13 NOTE — Patient Instructions (Addendum)
See assessment and plan under each diagnosis acutely for this visit  

## 2017-01-14 ENCOUNTER — Other Ambulatory Visit (HOSPITAL_COMMUNITY): Payer: Self-pay | Admitting: Internal Medicine

## 2017-01-14 DIAGNOSIS — R1310 Dysphagia, unspecified: Secondary | ICD-10-CM

## 2017-01-15 ENCOUNTER — Non-Acute Institutional Stay (SKILLED_NURSING_FACILITY): Payer: Medicare Other | Admitting: Internal Medicine

## 2017-01-15 ENCOUNTER — Ambulatory Visit
Admission: RE | Admit: 2017-01-15 | Discharge: 2017-01-15 | Disposition: A | Discharge status: Home / Self Care | Payer: Medicare Other | Source: Ambulatory Visit | Attending: Physician Assistant | Admitting: Physician Assistant

## 2017-01-15 ENCOUNTER — Encounter: Payer: Self-pay | Admitting: Internal Medicine

## 2017-01-15 DIAGNOSIS — F329 Major depressive disorder, single episode, unspecified: Secondary | ICD-10-CM

## 2017-01-15 DIAGNOSIS — I609 Nontraumatic subarachnoid hemorrhage, unspecified: Secondary | ICD-10-CM | POA: Diagnosis not present

## 2017-01-15 DIAGNOSIS — J690 Pneumonitis due to inhalation of food and vomit: Secondary | ICD-10-CM | POA: Diagnosis not present

## 2017-01-15 DIAGNOSIS — R1310 Dysphagia, unspecified: Secondary | ICD-10-CM | POA: Diagnosis not present

## 2017-01-15 DIAGNOSIS — F32A Depression, unspecified: Secondary | ICD-10-CM | POA: Insufficient documentation

## 2017-01-15 NOTE — Progress Notes (Signed)
   [facility] Room: 303  PCP: Shelva Majestic, MD [address] [place] [address]   This is a nursing facility follow up for specific acute issue of aspiration pneumonia.  Interim medical record and care since last [facility] visit was updated with review of diagnostic studies and change in clinical status since last visit were documented.  HPI: The patient was started on Rocephin as well as a burst of steroids for apparent aspiration pneumonia .This is in context of dysphagia for which a swallowing study is pending. Portable chest x-ray was personally reviewed and did suggest right middle lobe and right lower lobe infiltrates  Review of systems: he denies any active symptoms of dysphagia or cardiopulmonary nature. He also denies depression.  Constitutional: No fever,significant weight change, fatigue  Eyes: No redness, discharge, pain, vision change ENT/mouth: No nasal congestion,  purulent discharge, earache,change in hearing ,sore throat  Cardiovascular: No chest pain, palpitations,paroxysmal nocturnal dyspnea, claudication, edema  Respiratory: No cough, sputum production,hemoptysis, DOE , significant snoring,apnea  Gastrointestinal: No heartburn,dysphagia,abdominal pain, nausea / vomiting,rectal bleeding, melena,change in bowels Genitourinary: No dysuria,hematuria, pyuria,  incontinence, nocturia Musculoskeletal: No joint stiffness, joint swelling, weakness,pain Dermatologic: No rash, pruritus, change in appearance of skin Neurologic: No dizziness,headache,syncope, seizures, numbness , tingling Psychiatric: No significant anxiety , depression, insomnia, anorexia Endocrine: No change in hair/skin/ nails, excessive thirst, excessive hunger, excessive urination  Hematologic/lymphatic: No significant bruising, lymphadenopathy,abnormal bleeding Allergy/immunology: No itchy/ watery eyes, significant sneezing, urticaria, angioedema  Physical  exam:  Pertinent or positive findings: Affect is flat. Slight ptosis present on the right. Left nasolabial fold is decreased. There is faint ecchymosis over the right temple. Second heart sound slightly accentuated. Breath sounds are decreased. He does have an rare isolated expiratory wheeze. Some fusiform change in the right knee with suggestion of effusion. Pedal pulses are decreased but palpable. He has hyperpigmentation over the forearms.  General appearance:Adequately nourished; no acute distress , increased work of breathing is present.   Lymphatic: No lymphadenopathy about the head, neck, axilla . Eyes: No conjunctival inflammation or lid edema is present. There is no scleral icterus. Ears:  External ear exam shows no significant lesions or deformities.   Nose:  External nasal examination shows no deformity or inflammation. Nasal mucosa are pink and moist without lesions ,exudates Oral exam: lips and gums are healthy appearing.There is no oropharyngeal erythema or exudate . Neck:  No thyromegaly, masses, tenderness noted.    Heart:  Normal rate and regular rhythm. S1 normal without gallop, murmur, click, rub .  Abdomen:Bowel sounds are normal. Abdomen is soft and nontender with no organomegaly, hernias,masses. GU: deferred  Extremities:  No cyanosis, clubbing,edema  Neurologic exam : Balance,Rhomberg,finger to nose testing could not be completed due to clinical state Skin: Warm & dry w/o tenting. No significant rash.  See summary under each active problem in the Problem List with associated updated therapeutic plan

## 2017-01-15 NOTE — Assessment & Plan Note (Signed)
Swallowing study Speech therapy actively involved

## 2017-01-15 NOTE — Assessment & Plan Note (Signed)
Sertraline trial, titrate as clinically indicated

## 2017-01-15 NOTE — Assessment & Plan Note (Signed)
Continue steroids, Rocephin, and pulmonary toilet

## 2017-01-16 ENCOUNTER — Encounter: Payer: Self-pay | Admitting: Gastroenterology

## 2017-01-16 NOTE — Patient Instructions (Signed)
See assessment and plan under each diagnosis in the problem list and acutely for this visit 

## 2017-01-19 LAB — PROTIME-INR: Protime: 16 seconds — AB (ref 10.0–13.8)

## 2017-01-19 LAB — POCT INR: INR: 1.3 — AB (ref 0.9–1.1)

## 2017-01-20 ENCOUNTER — Other Ambulatory Visit: Payer: Self-pay | Admitting: Family Medicine

## 2017-01-21 ENCOUNTER — Ambulatory Visit (HOSPITAL_COMMUNITY)
Admission: RE | Admit: 2017-01-21 | Discharge: 2017-01-21 | Disposition: A | Discharge status: Home / Self Care | Payer: Medicare Other | Source: Ambulatory Visit | Attending: Internal Medicine | Admitting: Internal Medicine

## 2017-01-21 DIAGNOSIS — R1310 Dysphagia, unspecified: Secondary | ICD-10-CM | POA: Diagnosis not present

## 2017-01-21 DIAGNOSIS — R1312 Dysphagia, oropharyngeal phase: Secondary | ICD-10-CM | POA: Diagnosis not present

## 2017-01-26 ENCOUNTER — Encounter: Payer: Medicare Other | Admitting: *Deleted

## 2017-01-26 ENCOUNTER — Telehealth: Payer: Self-pay | Admitting: Cardiology

## 2017-01-26 NOTE — Telephone Encounter (Signed)
LMOVM reminding pt to send remote transmission.   

## 2017-01-28 ENCOUNTER — Ambulatory Visit: Payer: Medicare Other

## 2017-01-30 ENCOUNTER — Encounter: Payer: Self-pay | Admitting: Cardiology

## 2017-02-09 ENCOUNTER — Ambulatory Visit: Payer: Medicare Other | Admitting: Family Medicine

## 2017-02-16 ENCOUNTER — Telehealth: Payer: Self-pay | Admitting: Family Medicine

## 2017-02-16 NOTE — Telephone Encounter (Signed)
° ° °  Wife said pt is home bound and will not be able to make any appointments. They are awaiting orders for home health

## 2017-02-17 ENCOUNTER — Telehealth: Payer: Self-pay | Admitting: Family Medicine

## 2017-02-17 DIAGNOSIS — Z9181 History of falling: Secondary | ICD-10-CM | POA: Diagnosis not present

## 2017-02-17 DIAGNOSIS — R1310 Dysphagia, unspecified: Secondary | ICD-10-CM | POA: Diagnosis not present

## 2017-02-17 DIAGNOSIS — M6281 Muscle weakness (generalized): Secondary | ICD-10-CM | POA: Diagnosis not present

## 2017-02-17 NOTE — Telephone Encounter (Signed)
° ° °  Scott with [facility] call to ask for verbal orders to see the patient in his home    [phone]

## 2017-02-17 NOTE — Telephone Encounter (Signed)
Called and left a voicemail message for [name] to find out what the nurse who went out and assessed determined.

## 2017-02-17 NOTE — Telephone Encounter (Signed)
I dont mind signing them but most home health orders require a face to face. Could we call [facility] for their help and see if they have suggestions or could send a caseworker out?

## 2017-02-17 NOTE — Telephone Encounter (Signed)
Cindy with [facility] and would like to know if Dr Durene Cal will approve home health orders until they can determine what to do with pt. Pt not seen sine 12/17.  A nurse is headed out there now. Would like a call back to advise.

## 2017-02-17 NOTE — Telephone Encounter (Signed)
[name]  would like pt orders  for twice a wk for 9 wks also [name] is requesting a Child psychotherapist for this patient for community resources

## 2017-02-18 ENCOUNTER — Ambulatory Visit: Payer: Medicare Other | Admitting: Family Medicine

## 2017-02-18 ENCOUNTER — Telehealth: Payer: Self-pay | Admitting: Family Medicine

## 2017-02-18 DIAGNOSIS — M6281 Muscle weakness (generalized): Secondary | ICD-10-CM | POA: Diagnosis not present

## 2017-02-18 DIAGNOSIS — Z9181 History of falling: Secondary | ICD-10-CM | POA: Diagnosis not present

## 2017-02-18 DIAGNOSIS — R1310 Dysphagia, unspecified: Secondary | ICD-10-CM | POA: Diagnosis not present

## 2017-02-18 NOTE — Telephone Encounter (Signed)
Spoke with Venetia Night, RN. I provided orders for a case worker, PT orders, & Home health orders. She denied needing anything further but I did instruct to call as needed. She verbalized  Understanding.

## 2017-02-18 NOTE — Telephone Encounter (Signed)
Spoke with Chrissie Withers, RN. I provided orders for a case worker, PT orders, & Home health orders. She denied needing anything further but I did instruct to call as needed. She verbalized  Understanding. 

## 2017-02-18 NOTE — Telephone Encounter (Signed)
Christy w/ [facility] is also requesting a Child psychotherapist for pt. Since he has several issues, they want to ensure they cover all the bases.

## 2017-02-18 NOTE — Telephone Encounter (Signed)
° ° °  Cathy with [facility] call to ask for orders to be sent to Hosp[facility] for them to come out and evaluate  He is aspirating all textures .   Cathy [phone]   Hospice fax [phone]

## 2017-02-19 DIAGNOSIS — R1310 Dysphagia, unspecified: Secondary | ICD-10-CM | POA: Diagnosis not present

## 2017-02-19 DIAGNOSIS — Z9181 History of falling: Secondary | ICD-10-CM | POA: Diagnosis not present

## 2017-02-19 DIAGNOSIS — M6281 Muscle weakness (generalized): Secondary | ICD-10-CM | POA: Diagnosis not present

## 2017-02-19 NOTE — Telephone Encounter (Signed)
Left a detailed voicemail message asking for a return phone call

## 2017-02-20 ENCOUNTER — Telehealth: Payer: Self-pay | Admitting: Family Medicine

## 2017-02-20 ENCOUNTER — Telehealth: Payer: Self-pay | Admitting: Internal Medicine

## 2017-02-20 NOTE — Telephone Encounter (Signed)
New message    Asher Muir at [facility] is calling asking for an order to deactivate pt pacemaker or someone to go deactivate it. She said they admitted him to hospice today. And he will be in hospice house.

## 2017-02-20 NOTE — Telephone Encounter (Signed)
Called Cardiology office and I am waiting for the nurse to call me back.

## 2017-02-20 NOTE — Telephone Encounter (Signed)
Rosey Bath with [facility] is calling to see if someone could go out and deactivate pts defibrillator this weekend.  Pt will be in the facility for a short period of time.

## 2017-02-23 ENCOUNTER — Telehealth: Payer: Self-pay | Admitting: Family Medicine

## 2017-02-23 NOTE — Telephone Encounter (Signed)
Kendal Hymen with [facility]osp[facility] reports pt passing yesterday, July 1 at 7:33 am @ home.

## 2017-02-23 NOTE — Telephone Encounter (Signed)
Updated chart. Called wife and offered condolences.

## 2017-03-25 DEATH — deceased

## 2018-04-21 IMAGING — CT CT HEAD W/O CM
4 series · 15 of 47 positions shown, 17 images · non-contrast
Comparison: None.

CLINICAL DATA: Fell on a patio step and hit his head in the right
temporal area today.

EXAM:
CT HEAD WITHOUT CONTRAST
TECHNIQUE: Contiguous axial images were obtained from the base of the skull
through the vertex without intravenous contrast.

[Series 3: head without · axial · non-contrast · 0.45mm/px · z∈[+288,+408]mm · 7 of 33 slices shown, 9 images]
[im 5/33  brain]
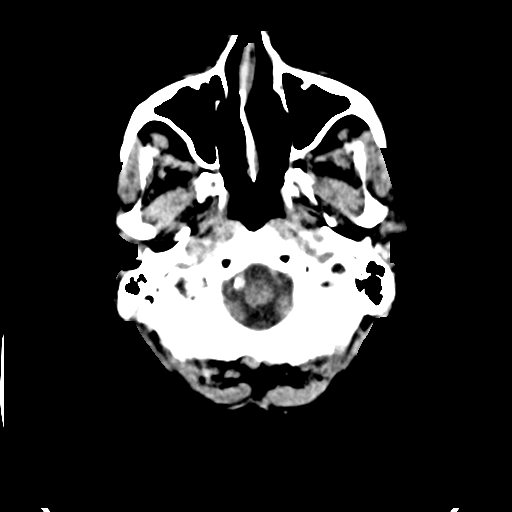
[im 5/33  bone]
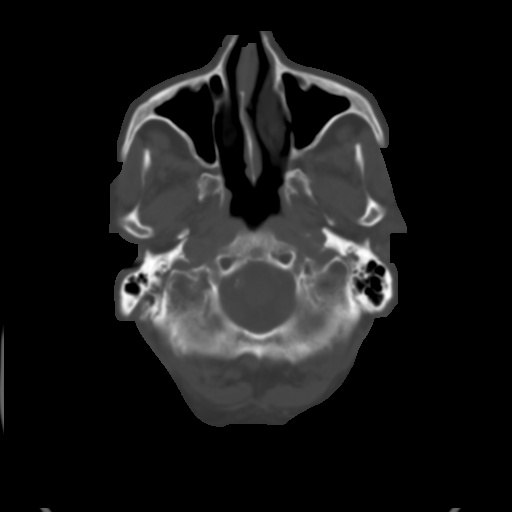
[im 9/33  brain]
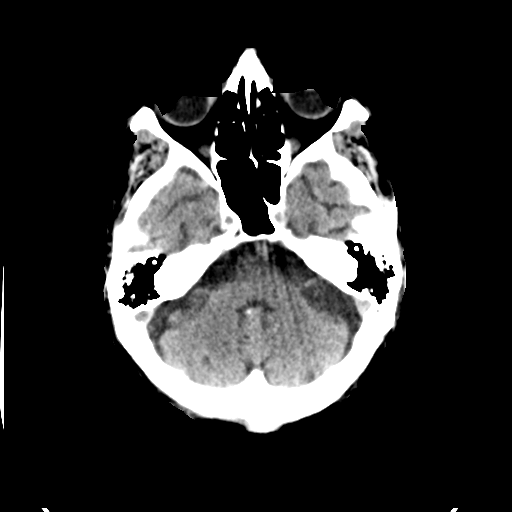
[im 13/33  brain]
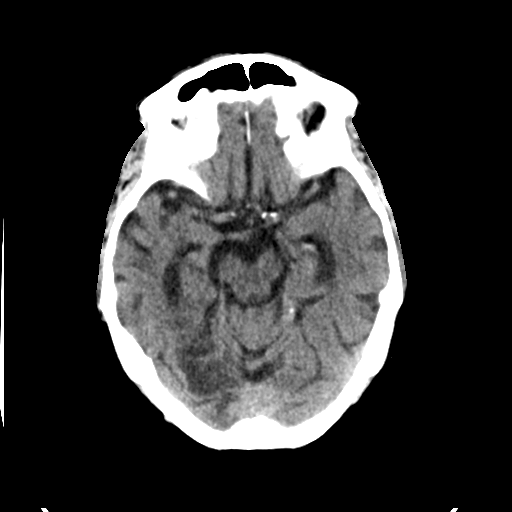
[im 17/33  brain]
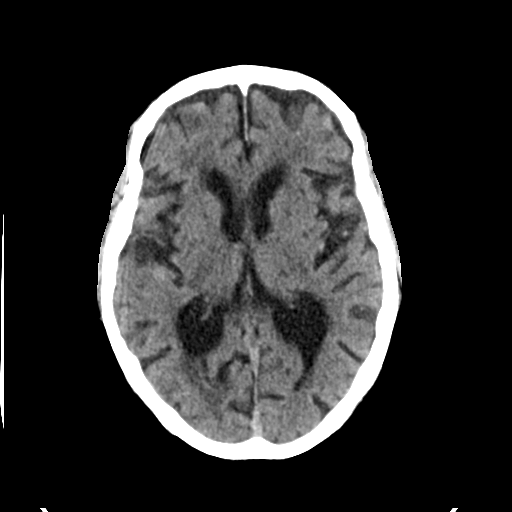
[im 21/33  brain]
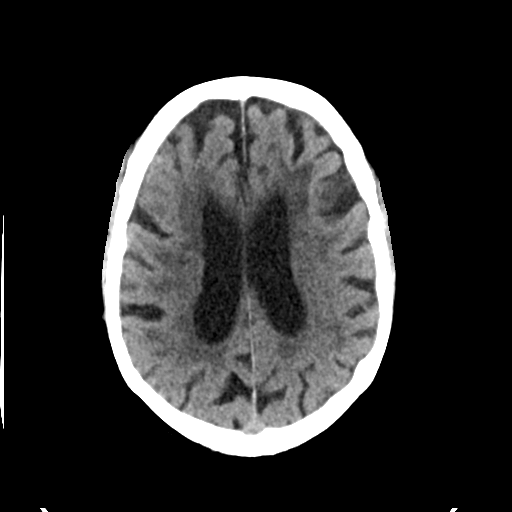
[im 21/33  bone]
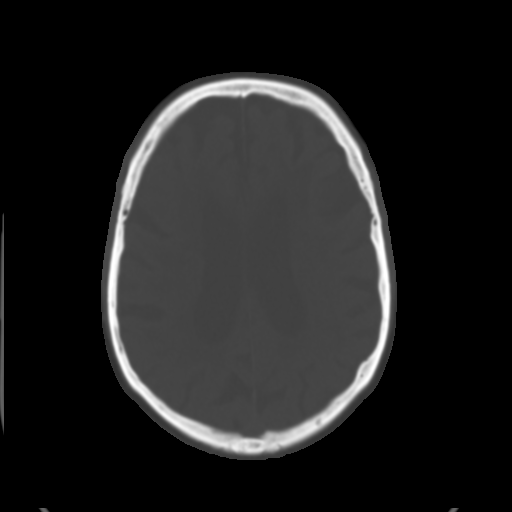
[im 25/33  brain]
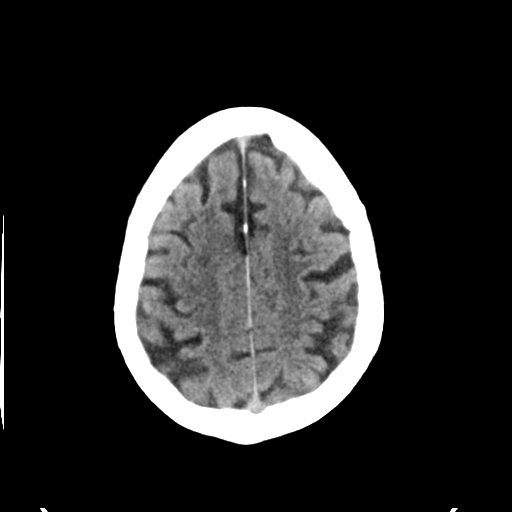
[im 29/33  brain]
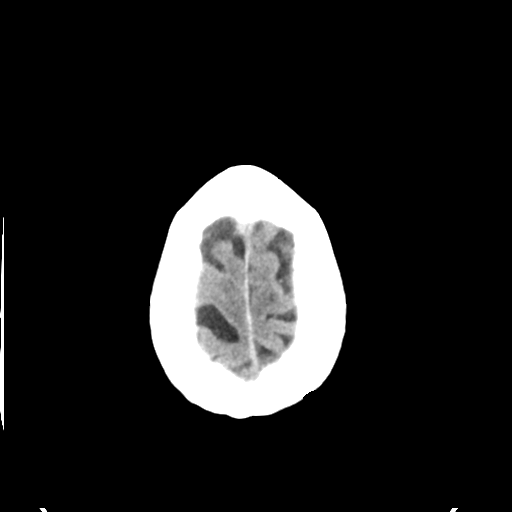

[Series 4: head bone · axial · 0.45mm/px · z∈[+284,+300]mm · 2 of 81 slices shown]
[im 9/81  bone]
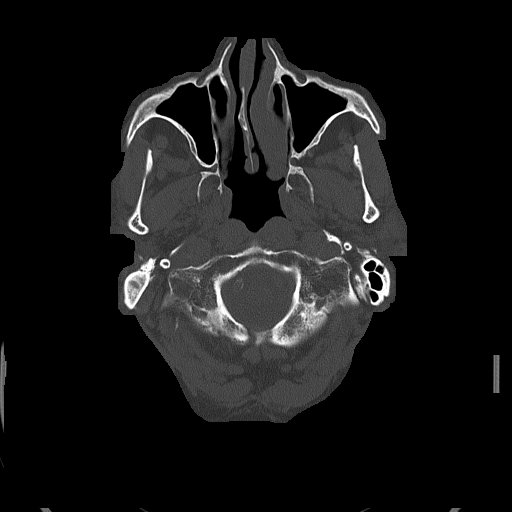
[im 17/81  bone]
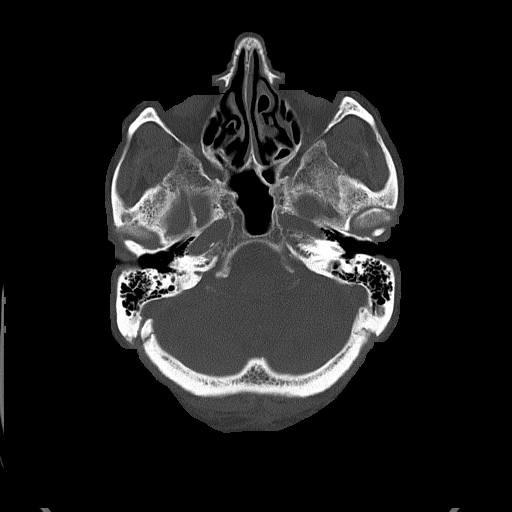

[Series 5: head without cor · coronal · non-contrast · 0.32mm/px · 3 of 74 slices shown]
[im 25/74  brain]
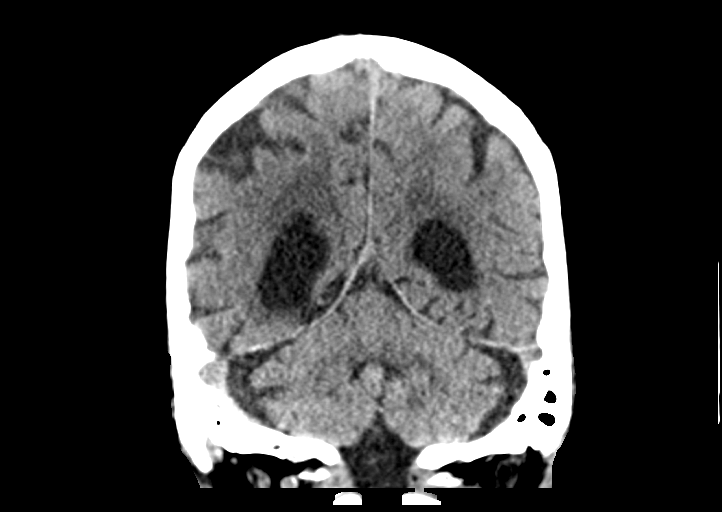
[im 33/74  brain]
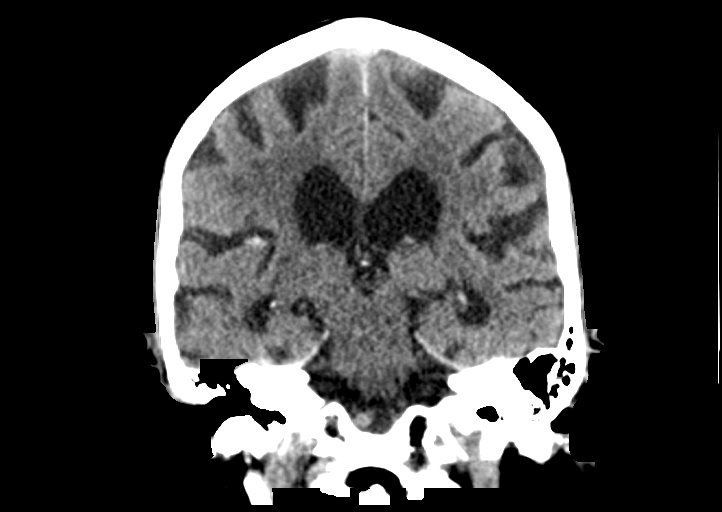
[im 41/74  brain]
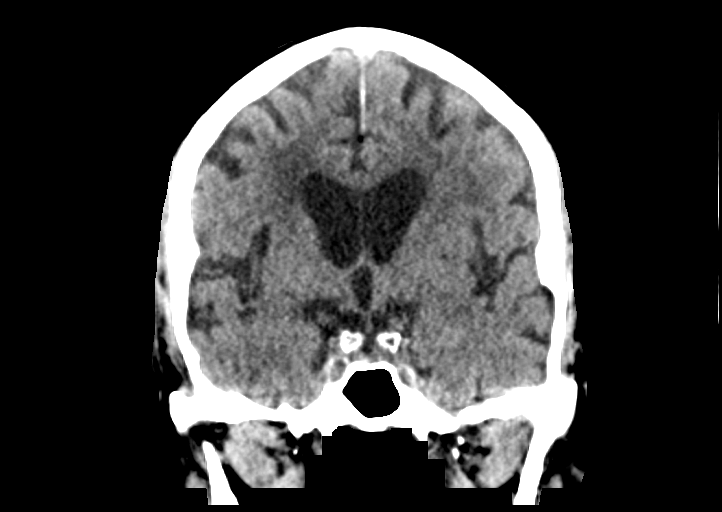

[Series 6: head without sag · sagittal · non-contrast · 0.35mm/px · 3 of 63 slices shown]
[im 21/63  brain]
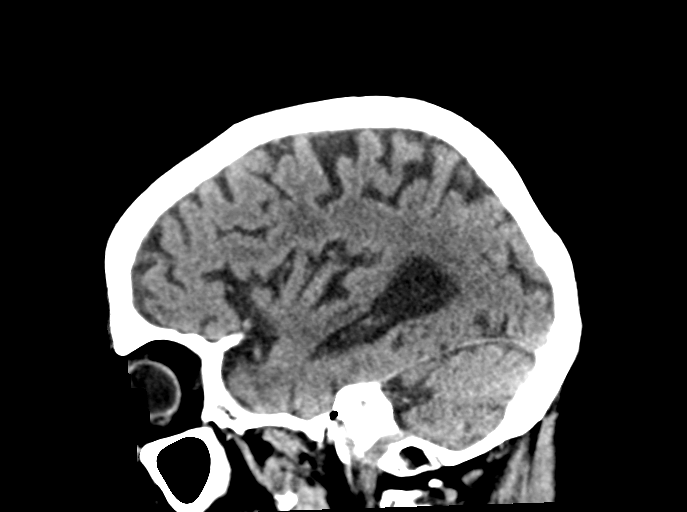
[im 32/63  brain]
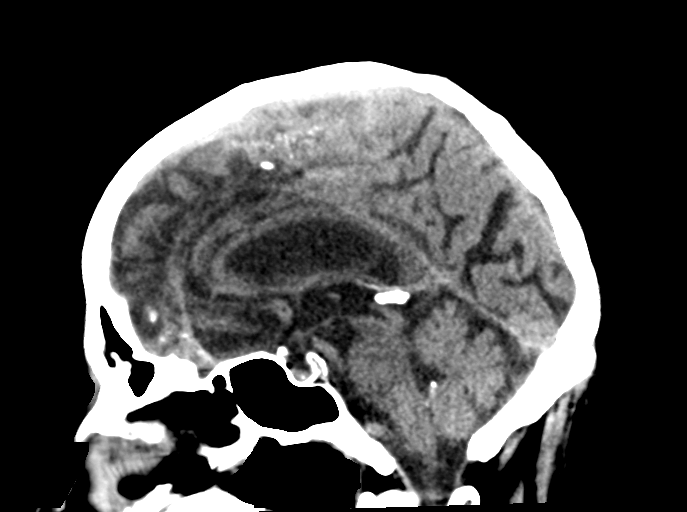
[im 42/63  brain]
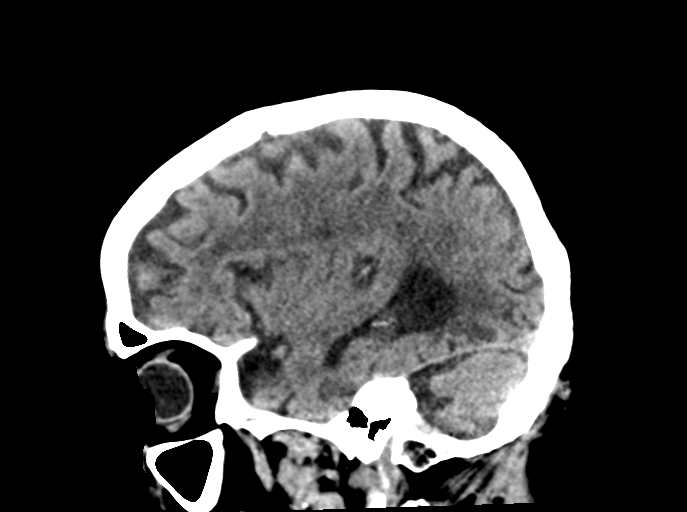

[15 of 47 positions shown; findings below may reference images not displayed]

FINDINGS: Brain: Diffusely enlarged ventricles and subarachnoid spaces. Patchy
white matter low density in both cerebral hemispheres. Old right
occipital lobe infarct. Old bilateral cerebellar hemisphere
infarcts. Small amount of blood in the posterior sylvian fissure on
the right. No intraventricular or extra-axial blood. No mass effect.

Vascular: No hyperdense vessel or unexpected calcification.

Skull: Normal. Negative for fracture or focal lesion.

Sinuses/Orbits: Unremarkable.

Other: Small right lateral scalp hematoma.
IMPRESSION: 1. Small amount of subarachnoid hemorrhage in the sylvian fissure on
the right.
2. Moderate to marked diffuse cerebral and cerebellar atrophy and
chronic small vessel white matter ischemic changes in both cerebral
hemispheres.
3. Old right occipital and bilateral cerebellar hemisphere infarcts.
Critical Value/emergent results were called by telephone at the time
of interpretation on 12/30/2016 at [DATE] to NIGM, ABDOALLA., who
verbally acknowledged these results.
